# Patient Record
Sex: Male | Born: 1960 | Race: White | Hispanic: No | Marital: Single | Smoking: Former smoker
Health system: Southern US, Community
[De-identification: ages and names within clinical notes are randomized; demographics above are authoritative.]

## PROBLEM LIST (undated history)

## (undated) DIAGNOSIS — M5126 Other intervertebral disc displacement, lumbar region: Secondary | ICD-10-CM

## (undated) DIAGNOSIS — T8859XA Other complications of anesthesia, initial encounter: Secondary | ICD-10-CM

## (undated) DIAGNOSIS — T4145XA Adverse effect of unspecified anesthetic, initial encounter: Secondary | ICD-10-CM

## (undated) DIAGNOSIS — Z860101 Personal history of adenomatous and serrated colon polyps: Secondary | ICD-10-CM

## (undated) DIAGNOSIS — I714 Abdominal aortic aneurysm, without rupture, unspecified: Secondary | ICD-10-CM

## (undated) DIAGNOSIS — C329 Malignant neoplasm of larynx, unspecified: Secondary | ICD-10-CM

## (undated) DIAGNOSIS — K219 Gastro-esophageal reflux disease without esophagitis: Secondary | ICD-10-CM

## (undated) DIAGNOSIS — F32A Depression, unspecified: Secondary | ICD-10-CM

## (undated) DIAGNOSIS — E785 Hyperlipidemia, unspecified: Secondary | ICD-10-CM

## (undated) DIAGNOSIS — M419 Scoliosis, unspecified: Secondary | ICD-10-CM

## (undated) DIAGNOSIS — G4733 Obstructive sleep apnea (adult) (pediatric): Secondary | ICD-10-CM

## (undated) DIAGNOSIS — Z923 Personal history of irradiation: Secondary | ICD-10-CM

## (undated) DIAGNOSIS — F329 Major depressive disorder, single episode, unspecified: Secondary | ICD-10-CM

## (undated) DIAGNOSIS — Z8521 Personal history of malignant neoplasm of larynx: Secondary | ICD-10-CM

## (undated) DIAGNOSIS — M51369 Other intervertebral disc degeneration, lumbar region without mention of lumbar back pain or lower extremity pain: Secondary | ICD-10-CM

## (undated) DIAGNOSIS — Z9289 Personal history of other medical treatment: Secondary | ICD-10-CM

## (undated) DIAGNOSIS — F319 Bipolar disorder, unspecified: Secondary | ICD-10-CM

## (undated) DIAGNOSIS — M1990 Unspecified osteoarthritis, unspecified site: Secondary | ICD-10-CM

## (undated) DIAGNOSIS — F209 Schizophrenia, unspecified: Secondary | ICD-10-CM

## (undated) DIAGNOSIS — E039 Hypothyroidism, unspecified: Secondary | ICD-10-CM

## (undated) DIAGNOSIS — M5136 Other intervertebral disc degeneration, lumbar region: Secondary | ICD-10-CM

## (undated) DIAGNOSIS — Z86010 Personal history of colonic polyps: Secondary | ICD-10-CM

## (undated) DIAGNOSIS — I712 Thoracic aortic aneurysm, without rupture: Secondary | ICD-10-CM

## (undated) DIAGNOSIS — Z9989 Dependence on other enabling machines and devices: Secondary | ICD-10-CM

## (undated) HISTORY — DX: Thoracic aortic aneurysm, without rupture: I71.2

## (undated) HISTORY — DX: Depression, unspecified: F32.A

## (undated) HISTORY — DX: Obstructive sleep apnea (adult) (pediatric): G47.33

## (undated) HISTORY — DX: Malignant neoplasm of larynx, unspecified: C32.9

## (undated) HISTORY — PX: UMBILICAL HERNIA REPAIR: SHX196

## (undated) HISTORY — DX: Schizophrenia, unspecified: F20.9

## (undated) HISTORY — DX: Bipolar disorder, unspecified: F31.9

## (undated) HISTORY — DX: Hyperlipidemia, unspecified: E78.5

## (undated) HISTORY — DX: Abdominal aortic aneurysm, without rupture, unspecified: I71.40

## (undated) HISTORY — DX: Unspecified osteoarthritis, unspecified site: M19.90

## (undated) HISTORY — PX: COLONOSCOPY: SHX174

## (undated) HISTORY — PX: NECK SURGERY: SHX720

## (undated) HISTORY — DX: Major depressive disorder, single episode, unspecified: F32.9

## (undated) HISTORY — DX: Dependence on other enabling machines and devices: Z99.89

## (undated) HISTORY — DX: Abdominal aortic aneurysm, without rupture: I71.4

---

## 2010-03-29 ENCOUNTER — Ambulatory Visit: Payer: Self-pay | Admitting: Oncology

## 2010-04-08 LAB — CBC WITH DIFFERENTIAL/PLATELET
Eosinophils Absolute: 0.1 10*3/uL (ref 0.0–0.5)
MONO#: 0.5 10*3/uL (ref 0.1–0.9)
NEUT#: 3.5 10*3/uL (ref 1.5–6.5)
Platelets: 240 10*3/uL (ref 140–400)
RBC: 5.15 10*6/uL (ref 4.20–5.82)
RDW: 13.6 % (ref 11.0–14.6)
WBC: 5.4 10*3/uL (ref 4.0–10.3)

## 2010-04-08 LAB — COMPREHENSIVE METABOLIC PANEL
Albumin: 4.7 g/dL (ref 3.5–5.2)
CO2: 27 mEq/L (ref 19–32)
Chloride: 105 mEq/L (ref 96–112)
Glucose, Bld: 87 mg/dL (ref 70–99)
Potassium: 4.5 mEq/L (ref 3.5–5.3)
Sodium: 143 mEq/L (ref 135–145)
Total Protein: 7.1 g/dL (ref 6.0–8.3)

## 2010-04-08 LAB — TSH: TSH: 28.728 u[IU]/mL — ABNORMAL HIGH (ref 0.350–4.500)

## 2010-04-10 LAB — T3: T3, Total: 70.8 ng/dL — ABNORMAL LOW (ref 80.0–204.0)

## 2010-04-15 ENCOUNTER — Encounter: Admission: RE | Admit: 2010-04-15 | Discharge: 2010-04-15 | Payer: Self-pay | Admitting: Otolaryngology

## 2010-05-17 DIAGNOSIS — F309 Manic episode, unspecified: Secondary | ICD-10-CM | POA: Insufficient documentation

## 2010-05-17 DIAGNOSIS — M129 Arthropathy, unspecified: Secondary | ICD-10-CM | POA: Insufficient documentation

## 2010-05-17 DIAGNOSIS — H409 Unspecified glaucoma: Secondary | ICD-10-CM | POA: Insufficient documentation

## 2010-05-21 DIAGNOSIS — R110 Nausea: Secondary | ICD-10-CM | POA: Insufficient documentation

## 2010-10-07 ENCOUNTER — Other Ambulatory Visit: Payer: Self-pay | Admitting: Oncology

## 2010-10-07 ENCOUNTER — Encounter (HOSPITAL_BASED_OUTPATIENT_CLINIC_OR_DEPARTMENT_OTHER): Payer: Medicare Other | Admitting: Oncology

## 2010-10-07 DIAGNOSIS — C329 Malignant neoplasm of larynx, unspecified: Secondary | ICD-10-CM

## 2010-10-07 DIAGNOSIS — J984 Other disorders of lung: Secondary | ICD-10-CM

## 2010-10-07 DIAGNOSIS — E039 Hypothyroidism, unspecified: Secondary | ICD-10-CM

## 2010-10-07 LAB — COMPREHENSIVE METABOLIC PANEL
AST: 20 U/L (ref 0–37)
Albumin: 4.6 g/dL (ref 3.5–5.2)
Alkaline Phosphatase: 40 U/L (ref 39–117)
BUN: 18 mg/dL (ref 6–23)
Potassium: 4.3 mEq/L (ref 3.5–5.3)
Sodium: 142 mEq/L (ref 135–145)
Total Bilirubin: 0.7 mg/dL (ref 0.3–1.2)

## 2010-10-07 LAB — CBC WITH DIFFERENTIAL/PLATELET
Basophils Absolute: 0 10*3/uL (ref 0.0–0.1)
EOS%: 1.1 % (ref 0.0–7.0)
LYMPH%: 26.5 % (ref 14.0–49.0)
MCH: 30 pg (ref 27.2–33.4)
MCV: 89.3 fL (ref 79.3–98.0)
MONO%: 8.2 % (ref 0.0–14.0)
RBC: 5 10*6/uL (ref 4.20–5.82)
RDW: 13.9 % (ref 11.0–14.6)

## 2010-12-19 ENCOUNTER — Encounter: Payer: Self-pay | Admitting: Internal Medicine

## 2010-12-19 ENCOUNTER — Ambulatory Visit (INDEPENDENT_AMBULATORY_CARE_PROVIDER_SITE_OTHER): Payer: Medicare HMO | Admitting: Internal Medicine

## 2010-12-19 DIAGNOSIS — C329 Malignant neoplasm of larynx, unspecified: Secondary | ICD-10-CM

## 2010-12-19 DIAGNOSIS — F319 Bipolar disorder, unspecified: Secondary | ICD-10-CM

## 2010-12-19 DIAGNOSIS — Z79899 Other long term (current) drug therapy: Secondary | ICD-10-CM

## 2010-12-19 DIAGNOSIS — K5190 Ulcerative colitis, unspecified, without complications: Secondary | ICD-10-CM | POA: Insufficient documentation

## 2010-12-19 DIAGNOSIS — E039 Hypothyroidism, unspecified: Secondary | ICD-10-CM

## 2010-12-19 DIAGNOSIS — Z23 Encounter for immunization: Secondary | ICD-10-CM

## 2010-12-19 DIAGNOSIS — E785 Hyperlipidemia, unspecified: Secondary | ICD-10-CM | POA: Insufficient documentation

## 2010-12-19 DIAGNOSIS — F209 Schizophrenia, unspecified: Secondary | ICD-10-CM

## 2010-12-19 DIAGNOSIS — Z0000 Encounter for general adult medical examination without abnormal findings: Secondary | ICD-10-CM

## 2010-12-19 HISTORY — DX: Bipolar disorder, unspecified: F31.9

## 2010-12-19 HISTORY — DX: Malignant neoplasm of larynx, unspecified: C32.9

## 2010-12-19 LAB — HEPATIC FUNCTION PANEL
ALT: 25 U/L (ref 0–53)
Albumin: 4.7 g/dL (ref 3.5–5.2)
Total Bilirubin: 0.7 mg/dL (ref 0.3–1.2)
Total Protein: 7.3 g/dL (ref 6.0–8.3)

## 2010-12-19 LAB — T4, FREE: Free T4: 1.48 ng/dL (ref 0.60–1.60)

## 2010-12-19 LAB — LIPID PANEL
Cholesterol: 176 mg/dL (ref 0–200)
Triglycerides: 156 mg/dL — ABNORMAL HIGH (ref 0.0–149.0)

## 2010-12-19 LAB — BASIC METABOLIC PANEL
BUN: 20 mg/dL (ref 6–23)
Chloride: 106 mEq/L (ref 96–112)
Creatinine, Ser: 1.1 mg/dL (ref 0.4–1.5)
Glucose, Bld: 112 mg/dL — ABNORMAL HIGH (ref 70–99)

## 2010-12-19 NOTE — Assessment & Plan Note (Signed)
Asx. Obtain tsh and free t4 for surveillance.

## 2010-12-19 NOTE — Assessment & Plan Note (Signed)
No behavioral issues noted today. Continue followup with psychiatry

## 2010-12-19 NOTE — Assessment & Plan Note (Signed)
Tolerating statin tx. Obtain flp/lft. Not entirely fasting but has transportation issues.

## 2010-12-19 NOTE — Progress Notes (Signed)
  Subjective:    Patient ID: Andrew Jennings, male    DOB: 1960/11/01, 50 y.o.   MRN: [id number]  HPI Pt presents to clinic to establish care and for followup of multiple medical problems.  H/o hyperlipidemia tolerating statin tx and trilipex. No known recent lft or lipid panel. States was diagnosed with ?UC by GI with last colonoscopy 2011. Initially did not tolerate pentasa but not tolerates at lower dose without adverse effect. Followed by ENT and H/O for laryngeal cancer with no known h/o recurrence. Notes h/o bipolar and schizophrenia followed by psychiatry without behavioral issues today in clinic. H/o hypothyroidism with no obvious s/s of hyper or hypothyroidism currently. States underwent DRE and PSA (reportedly nl) approximately 3 months ago but needs tetanus booster. No other complaints.  Reviewed pmh, psh, medications, allergies, social hx and family hx.    Review of Systems  Constitutional: Negative for fever, appetite change and unexpected weight change.  Eyes: Negative for pain and redness.  Respiratory: Negative for cough, shortness of breath and wheezing.   Cardiovascular: Negative for chest pain and leg swelling.  Gastrointestinal: Negative for abdominal pain, blood in stool and abdominal distention.  Musculoskeletal: Negative for back pain and arthralgias.  Skin: Negative for color change and rash.  Neurological: Negative for seizures, syncope and weakness.  Hematological: Negative for adenopathy. Does not bruise/bleed easily.  Psychiatric/Behavioral: Negative for behavioral problems, confusion and agitation.  All other systems reviewed and are negative.       Objective:   Physical Exam  Nursing note and vitals reviewed. Constitutional: He appears well-developed and well-nourished. No distress.  HENT:  Head: Normocephalic and atraumatic.  Right Ear: Tympanic membrane, external ear and ear canal normal.  Left Ear: Tympanic membrane, external ear and ear canal normal.    Nose: Nose normal.  Mouth/Throat: Uvula is midline, oropharynx is clear and moist and mucous membranes are normal. No oropharyngeal exudate.  Eyes: Conjunctivae and EOM are normal. Pupils are equal, round, and reactive to light. Right eye exhibits no discharge. Left eye exhibits no discharge. No scleral icterus.  Neck: Neck supple. No JVD present. Carotid bruit is not present.  Cardiovascular: Normal rate, regular rhythm and normal heart sounds.  Exam reveals no gallop and no friction rub.   No murmur heard. Pulmonary/Chest: Effort normal. No respiratory distress. He has no wheezes. He has no rales.  Abdominal: Soft. Bowel sounds are normal. He exhibits no distension and no mass. There is no tenderness. There is no rebound and no guarding.  Lymphadenopathy:    He has no cervical adenopathy.  Neurological: He is alert.  Skin: Skin is warm and dry. No rash noted. He is not diaphoretic. No erythema.  Psychiatric: He has a normal mood and affect.          Assessment & Plan:

## 2010-12-24 ENCOUNTER — Telehealth: Payer: Self-pay | Admitting: *Deleted

## 2010-12-24 ENCOUNTER — Telehealth: Payer: Self-pay

## 2010-12-24 MED ORDER — LEVOTHYROXINE SODIUM 25 MCG PO TABS: 25.0000 ug | ORAL_TABLET | Freq: Every day | ORAL | Status: DC

## 2010-12-24 NOTE — Telephone Encounter (Signed)
Result note has already been sent to nursing who will contact

## 2010-12-24 NOTE — Telephone Encounter (Signed)
Pt. Would like results of lab work.

## 2010-12-24 NOTE — Telephone Encounter (Signed)
Left message for pt to call back  °

## 2010-12-24 NOTE — Telephone Encounter (Signed)
Message copied by Beverely Low on Tue Dec 24, 2010  4:10 PM ------      Message from: Staci Righter      Created: Mon Dec 23, 2010  7:30 PM       Thyroid tests trending towards overactive. Decrease synthroid dose to 25 mcg po qd. Will recheck next visit. Other labs nl

## 2010-12-24 NOTE — Telephone Encounter (Signed)
Pt notified and verbalized understanding. Rx for synthroid sent to pharm

## 2010-12-24 NOTE — Telephone Encounter (Signed)
Message copied by Beverely Low on Tue Dec 24, 2010  4:32 PM ------      Message from: Staci Righter      Created: Mon Dec 23, 2010  7:30 PM       Thyroid tests trending towards overactive. Decrease synthroid dose to 25 mcg po qd. Will recheck next visit. Other labs nl

## 2011-01-03 ENCOUNTER — Other Ambulatory Visit: Payer: Self-pay

## 2011-01-06 MED ORDER — ROSUVASTATIN CALCIUM 5 MG PO TABS: 5.0000 mg | ORAL_TABLET | Freq: Every day | ORAL | Status: DC

## 2011-01-06 NOTE — Telephone Encounter (Signed)
done

## 2011-01-17 ENCOUNTER — Other Ambulatory Visit: Payer: Self-pay

## 2011-01-17 MED ORDER — MESALAMINE ER 250 MG PO CPCR: 250.0000 mg | ORAL_CAPSULE | Freq: Two times a day (BID) | ORAL | Status: DC

## 2011-01-17 NOTE — Telephone Encounter (Signed)
Opened in error

## 2011-01-20 ENCOUNTER — Other Ambulatory Visit: Payer: Self-pay

## 2011-01-20 MED ORDER — HALOPERIDOL 0.5 MG PO TABS: ORAL_TABLET | ORAL | Status: DC

## 2011-03-20 ENCOUNTER — Encounter: Payer: Self-pay | Admitting: Internal Medicine

## 2011-03-20 ENCOUNTER — Ambulatory Visit (INDEPENDENT_AMBULATORY_CARE_PROVIDER_SITE_OTHER): Payer: Medicare HMO | Admitting: Internal Medicine

## 2011-03-20 ENCOUNTER — Other Ambulatory Visit: Payer: Self-pay | Admitting: Internal Medicine

## 2011-03-20 DIAGNOSIS — K5190 Ulcerative colitis, unspecified, without complications: Secondary | ICD-10-CM

## 2011-03-20 DIAGNOSIS — E039 Hypothyroidism, unspecified: Secondary | ICD-10-CM

## 2011-03-20 DIAGNOSIS — E785 Hyperlipidemia, unspecified: Secondary | ICD-10-CM

## 2011-03-20 LAB — TSH: TSH: 63 u[IU]/mL — ABNORMAL HIGH (ref 0.35–5.50)

## 2011-03-20 MED ORDER — SILDENAFIL CITRATE 100 MG PO TABS: 100.0000 mg | ORAL_TABLET | ORAL | Status: DC | PRN

## 2011-03-20 MED ORDER — LEVOTHYROXINE SODIUM 25 MCG PO TABS: 50.0000 ug | ORAL_TABLET | Freq: Every day | ORAL | Status: DC

## 2011-03-20 NOTE — Progress Notes (Signed)
  Subjective:    Patient ID: Andrew Jennings, male    DOB: [birth year], 50 y.o.   MRN: [id number]  HPI  50 year old  patient who is seen today for followup.  Medical problems include dyslipidemia. Medical records reviewed he has been on Crestor as well as fenofibrate. He has treated hypothyroidism 3 months ago his Synthroid dose was down titrated to 25 mcg due to a mildly depressed TSH. He states that he now feels a little sluggish. He has a history of ulcerative colitis and has been on mesalamine. He has dyslipidemia and remained stable on Crestor which he continues to tolerate. He is requesting a prescription for Viagra.  He is followed by [facility] and states that he is on 35 mg of Haldol daily.     Review of Systems  Constitutional: Positive for fatigue. Negative for fever, chills and appetite change.  HENT: Negative for hearing loss, ear pain, congestion, sore throat, trouble swallowing, neck stiffness, dental problem, voice change and tinnitus.   Eyes: Negative for pain, discharge and visual disturbance.  Respiratory: Negative for cough, chest tightness, wheezing and stridor.   Cardiovascular: Negative for chest pain, palpitations and leg swelling.  Gastrointestinal: Negative for nausea, vomiting, abdominal pain, diarrhea, constipation, blood in stool and abdominal distention.  Genitourinary: Negative for urgency, hematuria, flank pain, discharge, difficulty urinating and genital sores.  Musculoskeletal: Negative for myalgias, back pain, joint swelling, arthralgias and gait problem.  Skin: Negative for rash.  Neurological: Negative for dizziness, syncope, speech difficulty, weakness, numbness and headaches.  Hematological: Negative for adenopathy. Does not bruise/bleed easily.  Psychiatric/Behavioral: Negative for behavioral problems and dysphoric mood. The patient is not nervous/anxious.        Objective:   Physical Exam  Constitutional: He is oriented to person, place,  and time. He appears well-developed.  HENT:  Head: Normocephalic.  Right Ear: External ear normal.  Left Ear: External ear normal.  Eyes: Conjunctivae and EOM are normal.  Neck: Normal range of motion.  Cardiovascular: Normal rate and normal heart sounds.   Pulmonary/Chest: Breath sounds normal.  Abdominal: Bowel sounds are normal.  Musculoskeletal: Normal range of motion. He exhibits no edema and no tenderness.  Neurological: He is alert and oriented to person, place, and time.  Psychiatric: He has a normal mood and affect. His behavior is normal.          Assessment & Plan:    Dyslipidemia. Will continue present regimen Ulcerative colitis stable Hypothyroidism. Will check a TSH due to his recent downward dose titration and history of fatigue. History of bipolar disorder and schizophrenia. Followup mental health

## 2011-03-20 NOTE — Patient Instructions (Signed)
Limit your sodium (Salt) intake    It is important that you exercise regularly, at least 20 minutes 3 to 4 times per week.  If you develop chest pain or shortness of breath seek  medical attention.  Return in 3 months for follow-up  

## 2011-03-20 NOTE — Progress Notes (Signed)
Quick Note:  Spoke with pt- informed of lab and increase in med , needs to call and scdule lab in 1 month. New order for med sent to [facility] ______

## 2011-03-31 ENCOUNTER — Ambulatory Visit: Payer: Medicare HMO | Admitting: Internal Medicine

## 2011-04-07 ENCOUNTER — Other Ambulatory Visit: Payer: Self-pay | Admitting: Oncology

## 2011-04-07 ENCOUNTER — Encounter (HOSPITAL_BASED_OUTPATIENT_CLINIC_OR_DEPARTMENT_OTHER): Payer: Medicare HMO | Admitting: Oncology

## 2011-04-07 DIAGNOSIS — J984 Other disorders of lung: Secondary | ICD-10-CM

## 2011-04-07 DIAGNOSIS — Z87891 Personal history of nicotine dependence: Secondary | ICD-10-CM

## 2011-04-07 DIAGNOSIS — E039 Hypothyroidism, unspecified: Secondary | ICD-10-CM

## 2011-04-07 DIAGNOSIS — C329 Malignant neoplasm of larynx, unspecified: Secondary | ICD-10-CM

## 2011-04-07 DIAGNOSIS — E890 Postprocedural hypothyroidism: Secondary | ICD-10-CM

## 2011-04-07 LAB — COMPREHENSIVE METABOLIC PANEL
ALT: 17 U/L (ref 0–53)
AST: 24 U/L (ref 0–37)
Albumin: 4.7 g/dL (ref 3.5–5.2)
BUN: 16 mg/dL (ref 6–23)
CO2: 27 mEq/L (ref 19–32)
Calcium: 9.9 mg/dL (ref 8.4–10.5)
Chloride: 105 mEq/L (ref 96–112)
Creatinine, Ser: 1.52 mg/dL — ABNORMAL HIGH (ref 0.50–1.35)
Potassium: 4.7 mEq/L (ref 3.5–5.3)

## 2011-04-07 LAB — CBC WITH DIFFERENTIAL/PLATELET
Basophils Absolute: 0 10*3/uL (ref 0.0–0.1)
HCT: 43.3 % (ref 38.4–49.9)
HGB: 14.7 g/dL (ref 13.0–17.1)
MONO#: 0.5 10*3/uL (ref 0.1–0.9)
NEUT#: 3.6 10*3/uL (ref 1.5–6.5)
NEUT%: 66 % (ref 39.0–75.0)
RDW: 14 % (ref 11.0–14.6)
WBC: 5.4 10*3/uL (ref 4.0–10.3)
lymph#: 1.2 10*3/uL (ref 0.9–3.3)

## 2011-04-09 ENCOUNTER — Telehealth: Payer: Self-pay | Admitting: *Deleted

## 2011-04-09 NOTE — Telephone Encounter (Signed)
Selena Batten, I sent you these voice mail messages to see if that would help you figure out what pt needs.  He is asking for a new CPap Machine, I think.  He got his last one in [place], but knows no pressures or anything about it to make an appropriate order.

## 2011-04-09 NOTE — Telephone Encounter (Signed)
VM from pt requesting cpap rx for machine he currently has??? Called number left [phone] - which was to [facility] - when I call and spoke with mrs. Ward- they have already call pt and informed that they can not provide equip for him r/t ins coverage.  I will await call back from pt - but need to explore CPAP - i have no info on it

## 2011-04-16 ENCOUNTER — Other Ambulatory Visit (INDEPENDENT_AMBULATORY_CARE_PROVIDER_SITE_OTHER): Payer: Medicare HMO

## 2011-04-16 DIAGNOSIS — E039 Hypothyroidism, unspecified: Secondary | ICD-10-CM

## 2011-04-17 LAB — TSH: TSH: 42.11 u[IU]/mL — ABNORMAL HIGH (ref 0.35–5.50)

## 2011-04-18 NOTE — Progress Notes (Signed)
Quick Note:  Attempt to call - no ans no mach - will try on monday ______

## 2011-04-21 ENCOUNTER — Other Ambulatory Visit: Payer: Self-pay | Admitting: Internal Medicine

## 2011-04-21 MED ORDER — LEVOTHYROXINE SODIUM 75 MCG PO TABS: 75.0000 ug | ORAL_TABLET | Freq: Every day | ORAL | Status: DC

## 2011-04-21 NOTE — Progress Notes (Signed)
Quick Note:  Spoke with pt - informed of lab and new med . rx sent to [facility] , he will call back in 8 days to make appt - going out of town. KIK ______

## 2011-04-27 ENCOUNTER — Encounter: Payer: Self-pay | Admitting: Oncology

## 2011-05-02 ENCOUNTER — Telehealth: Payer: Self-pay | Admitting: *Deleted

## 2011-05-02 NOTE — Telephone Encounter (Signed)
Pt is asking who called him 2 weeks ago"???"

## 2011-05-09 ENCOUNTER — Encounter: Payer: Self-pay | Admitting: Internal Medicine

## 2011-05-09 ENCOUNTER — Ambulatory Visit (INDEPENDENT_AMBULATORY_CARE_PROVIDER_SITE_OTHER): Payer: Medicare HMO | Admitting: Internal Medicine

## 2011-05-09 VITALS — BP 108/70 | Temp 98.1°F | Wt 179.0 lb

## 2011-05-09 DIAGNOSIS — Z5181 Encounter for therapeutic drug level monitoring: Secondary | ICD-10-CM

## 2011-05-09 DIAGNOSIS — E039 Hypothyroidism, unspecified: Secondary | ICD-10-CM

## 2011-05-09 DIAGNOSIS — E785 Hyperlipidemia, unspecified: Secondary | ICD-10-CM

## 2011-05-09 LAB — TSH: TSH: 29.78 u[IU]/mL — ABNORMAL HIGH (ref 0.35–5.50)

## 2011-05-09 NOTE — Patient Instructions (Signed)
It is important that you exercise regularly, at least 20 minutes 3 to 4 times per week.  If you develop chest pain or shortness of breath seek  medical attention.   

## 2011-05-09 NOTE — Progress Notes (Signed)
  Subjective:    Patient ID: Andrew Jennings, male    DOB: Apr 04, 1961, 50 y.o.   MRN: [id number]  HPI  50 year old patient who is seen today for followup of his dyslipidemia and hypothyroidism. His Synthroid dose was recently up titrated to 75 mcg. Remains on Crestor 5 mg daily;  he is also on fibrate  therapy. He states that years ago he had a significant CPK elevation. He is requesting CPK. He does complain of some mild nonspecific anterior chest wall pain describes as a achiness this comes on with stress and seems to be paroxysmal. He denies any exertional chest pain.    Review of Systems  Constitutional: Negative for fever, chills, appetite change and fatigue.  HENT: Negative for hearing loss, ear pain, congestion, sore throat, trouble swallowing, neck stiffness, dental problem, voice change and tinnitus.   Eyes: Negative for pain, discharge and visual disturbance.  Respiratory: Negative for cough, chest tightness, wheezing and stridor.   Cardiovascular: Positive for chest pain. Negative for palpitations and leg swelling.  Gastrointestinal: Negative for nausea, vomiting, abdominal pain, diarrhea, constipation, blood in stool and abdominal distention.  Genitourinary: Negative for urgency, hematuria, flank pain, discharge, difficulty urinating and genital sores.  Musculoskeletal: Negative for myalgias, back pain, joint swelling, arthralgias and gait problem.  Skin: Negative for rash.  Neurological: Negative for dizziness, syncope, speech difficulty, weakness, numbness and headaches.  Hematological: Negative for adenopathy. Does not bruise/bleed easily.  Psychiatric/Behavioral: Negative for behavioral problems and dysphoric mood. The patient is not nervous/anxious.        Objective:   Physical Exam  Constitutional: He is oriented to person, place, and time. He appears well-developed.  HENT:  Head: Normocephalic.  Right Ear: External ear normal.  Left Ear: External ear normal.  Eyes:  Conjunctivae and EOM are normal.  Neck: Normal range of motion.  Cardiovascular: Normal rate and normal heart sounds.   Pulmonary/Chest: Effort normal and breath sounds normal. He exhibits no tenderness.       No chest wall tenderness  Abdominal: Bowel sounds are normal.  Musculoskeletal: Normal range of motion. He exhibits no edema and no tenderness.  Neurological: He is alert and oriented to person, place, and time.  Psychiatric: He has a normal mood and affect. His behavior is normal.          Assessment & Plan:   Dyslipidemia. Hypothyroidism. We'll check a followup TSH and uptitrate as necessary History of CPK elevation. Patient is on both statin therapy and fibrate. Will check a total CPK

## 2011-05-12 ENCOUNTER — Telehealth: Payer: Self-pay | Admitting: Internal Medicine

## 2011-05-12 ENCOUNTER — Ambulatory Visit (HOSPITAL_COMMUNITY)
Admission: RE | Admit: 2011-05-12 | Discharge: 2011-05-12 | Disposition: A | Discharge status: Home / Self Care | Payer: Medicare HMO | Source: Ambulatory Visit | Attending: Oncology | Admitting: Oncology

## 2011-05-12 ENCOUNTER — Encounter (HOSPITAL_COMMUNITY): Payer: Self-pay

## 2011-05-12 DIAGNOSIS — C760 Malignant neoplasm of head, face and neck: Secondary | ICD-10-CM | POA: Insufficient documentation

## 2011-05-12 DIAGNOSIS — Z87891 Personal history of nicotine dependence: Secondary | ICD-10-CM

## 2011-05-12 DIAGNOSIS — J438 Other emphysema: Secondary | ICD-10-CM | POA: Insufficient documentation

## 2011-05-12 MED ORDER — IOHEXOL 300 MG/ML  SOLN
80.0000 mL | Freq: Once | INTRAMUSCULAR | Status: AC | PRN
  Administered 2011-05-12: 80 mL via INTRAVENOUS

## 2011-05-12 MED ORDER — LEVOTHYROXINE SODIUM 25 MCG PO TABS: 25.0000 ug | ORAL_TABLET | Freq: Every day | ORAL | Status: DC

## 2011-05-12 NOTE — Telephone Encounter (Signed)
Spoke with patient and rx sent 

## 2011-05-12 NOTE — Telephone Encounter (Signed)
Pt requesting results of lab work. Please contact. °

## 2011-05-12 NOTE — Telephone Encounter (Signed)
Pt would like a call back bout his lab work. He knows that Dr Kirtland Bouchard is out of office. Thanks.

## 2011-05-13 ENCOUNTER — Telehealth: Payer: Self-pay | Admitting: Internal Medicine

## 2011-05-13 MED ORDER — LEVOTHYROXINE SODIUM 125 MCG PO TABS: 125.0000 ug | ORAL_TABLET | Freq: Every day | ORAL | Status: DC

## 2011-05-13 NOTE — Telephone Encounter (Signed)
Pt called and said [facility] would not fill the script for levothyroxine (LEVOTHROID) 25 MCG tablet. Pt said that he had thrown out the orig 25 mcg he had rcvd because he didn't know he should be taking them. Pls call pt to discuss what dose of thyroid med he is suppose to take and then call in the correct dose to [facility].

## 2011-05-19 ENCOUNTER — Other Ambulatory Visit: Payer: Self-pay | Admitting: Oncology

## 2011-05-19 ENCOUNTER — Encounter: Payer: Self-pay | Admitting: Oncology

## 2011-05-19 ENCOUNTER — Telehealth: Payer: Self-pay | Admitting: Oncology

## 2011-05-19 NOTE — Telephone Encounter (Signed)
Message copied by Kallie Locks on Mon May 19, 2011  2:14 PM ------      Message from: HA, Raliegh Ip T      Created: Mon May 19, 2011  1:43 PM       Please call pt.   His CT chest last week did no show any concerning finding.  I already mailed him a formal result.  Thanks.

## 2011-05-19 NOTE — Telephone Encounter (Signed)
1414: called patient [phone]) with CT results; patient verbalized understanding.

## 2011-06-20 ENCOUNTER — Ambulatory Visit: Payer: Medicare HMO | Admitting: Internal Medicine

## 2011-06-25 ENCOUNTER — Telehealth: Payer: Self-pay | Admitting: Oncology

## 2011-06-25 NOTE — Telephone Encounter (Signed)
S/w the pt's friend regarding the march 2013 appts

## 2011-06-30 ENCOUNTER — Ambulatory Visit: Payer: Medicare HMO | Admitting: Internal Medicine

## 2011-06-30 DIAGNOSIS — Z0289 Encounter for other administrative examinations: Secondary | ICD-10-CM

## 2011-07-09 ENCOUNTER — Ambulatory Visit (INDEPENDENT_AMBULATORY_CARE_PROVIDER_SITE_OTHER): Payer: Medicare HMO | Admitting: Internal Medicine

## 2011-07-09 ENCOUNTER — Encounter: Payer: Self-pay | Admitting: Internal Medicine

## 2011-07-09 DIAGNOSIS — J45909 Unspecified asthma, uncomplicated: Secondary | ICD-10-CM

## 2011-07-09 DIAGNOSIS — C329 Malignant neoplasm of larynx, unspecified: Secondary | ICD-10-CM

## 2011-07-09 DIAGNOSIS — E039 Hypothyroidism, unspecified: Secondary | ICD-10-CM

## 2011-07-09 LAB — TSH: TSH: 7.18 u[IU]/mL — ABNORMAL HIGH (ref 0.35–5.50)

## 2011-07-09 NOTE — Progress Notes (Signed)
  Subjective:    Patient ID: Andrew Jennings, male    DOB: 02-27-1961, 50 y.o.   MRN: [id number]  HPI 50 year old patient who is seen today for followup of  Hypothyroidism;  he has dyslipidemia and bipolar disorder. His Synthroid dose was adjusted 8 weeks ago due to an elevated TSH. He states that he misses approximately 3 doses as per month. He feels well today without concerns or complaints    Review of Systems  Constitutional: Negative for fever, chills, appetite change and fatigue.  HENT: Negative for hearing loss, ear pain, congestion, sore throat, trouble swallowing, neck stiffness, dental problem, voice change and tinnitus.   Eyes: Negative for pain, discharge and visual disturbance.  Respiratory: Negative for cough, chest tightness, wheezing and stridor.   Cardiovascular: Negative for chest pain, palpitations and leg swelling.  Gastrointestinal: Negative for nausea, vomiting, abdominal pain, diarrhea, constipation, blood in stool and abdominal distention.  Genitourinary: Negative for urgency, hematuria, flank pain, discharge, difficulty urinating and genital sores.  Musculoskeletal: Negative for myalgias, back pain, joint swelling, arthralgias and gait problem.  Skin: Negative for rash.  Neurological: Negative for dizziness, syncope, speech difficulty, weakness, numbness and headaches.  Hematological: Negative for adenopathy. Does not bruise/bleed easily.  Psychiatric/Behavioral: Negative for behavioral problems and dysphoric mood. The patient is not nervous/anxious.        Objective:   Physical Exam  Constitutional: He appears well-developed and well-nourished.  HENT:  Head: Normocephalic and atraumatic.  Right Ear: External ear normal.  Left Ear: External ear normal.  Nose: Nose normal.  Mouth/Throat: Oropharynx is clear and moist.  Eyes: Conjunctivae and EOM are normal. Pupils are equal, round, and reactive to light. No scleral icterus.  Neck: Normal range of motion. Neck  supple. No JVD present. No thyromegaly present.  Cardiovascular: Regular rhythm, normal heart sounds and intact distal pulses.  Exam reveals no gallop and no friction rub.   No murmur heard. Pulmonary/Chest: Effort normal and breath sounds normal. He exhibits no tenderness.  Abdominal: Soft. Bowel sounds are normal. He exhibits no distension and no mass. There is no tenderness.  Genitourinary: Prostate normal and penis normal.  Musculoskeletal: Normal range of motion. He exhibits no edema and no tenderness.  Lymphadenopathy:    He has no cervical adenopathy.  Neurological: He is alert. He has normal reflexes. No cranial nerve deficit. Coordination normal.  Skin: Skin is warm and dry. No rash noted.  Psychiatric: He has a normal mood and affect. His behavior is normal.          Assessment & Plan:   Hypothyroidism. We'll check a TSH today and compliance issues addressed. Dyslipidemia. Will continue present there  Recheck 4 months

## 2011-07-09 NOTE — Patient Instructions (Signed)
  Take thyroid medication daily  Return in 6 months for followup

## 2011-07-16 ENCOUNTER — Other Ambulatory Visit: Payer: Self-pay | Admitting: Internal Medicine

## 2011-07-16 ENCOUNTER — Telehealth: Payer: Self-pay

## 2011-07-16 NOTE — Telephone Encounter (Signed)
Pt requesting lab results. Pls call.

## 2011-07-17 ENCOUNTER — Telehealth: Payer: Self-pay | Admitting: Internal Medicine

## 2011-07-17 MED ORDER — MESALAMINE ER 250 MG PO CPCR: 250.0000 mg | ORAL_CAPSULE | Freq: Two times a day (BID) | ORAL | Status: DC

## 2011-07-17 NOTE — Telephone Encounter (Signed)
Please call/notify patient that lab/test/procedure is normal.  Stress compliance

## 2011-07-17 NOTE — Telephone Encounter (Signed)
Pt requesting refill on PENTASA 250 MG CR capsule   [facility] [place]

## 2011-07-17 NOTE — Telephone Encounter (Signed)
Spoke with pt - informed lab normal continue meds as rx'd. No changes

## 2011-07-17 NOTE — Telephone Encounter (Signed)
Please advise 

## 2011-07-17 NOTE — Telephone Encounter (Signed)
done

## 2011-08-04 ENCOUNTER — Other Ambulatory Visit: Payer: Self-pay | Admitting: Oncology

## 2011-08-04 ENCOUNTER — Telehealth: Payer: Self-pay | Admitting: Oncology

## 2011-08-04 NOTE — Telephone Encounter (Signed)
Called pt, left message for lab,CT and MD for March 2013 °

## 2011-08-08 ENCOUNTER — Ambulatory Visit: Payer: Medicare HMO | Admitting: Internal Medicine

## 2011-08-21 ENCOUNTER — Telehealth: Payer: Self-pay | Admitting: Oncology

## 2011-08-21 ENCOUNTER — Encounter: Payer: Self-pay | Admitting: Internal Medicine

## 2011-08-21 NOTE — Telephone Encounter (Signed)
pt called and cancelled appts for march and ct scan and states that he will call back after 04/13 to r/s appts

## 2011-08-22 ENCOUNTER — Encounter: Payer: Self-pay | Admitting: Internal Medicine

## 2011-09-23 ENCOUNTER — Other Ambulatory Visit: Payer: Self-pay

## 2011-09-23 ENCOUNTER — Telehealth: Payer: Self-pay

## 2011-09-23 ENCOUNTER — Encounter: Payer: Self-pay | Admitting: Internal Medicine

## 2011-09-23 ENCOUNTER — Ambulatory Visit (AMBULATORY_SURGERY_CENTER): Payer: Medicare HMO

## 2011-09-23 VITALS — Ht 67.0 in | Wt 179.9 lb

## 2011-09-23 DIAGNOSIS — Z1211 Encounter for screening for malignant neoplasm of colon: Secondary | ICD-10-CM

## 2011-09-23 MED ORDER — PEG-KCL-NACL-NASULF-NA ASC-C 100 G PO SOLR: 1.0000 | Freq: Once | ORAL | Status: AC

## 2011-09-23 MED ORDER — CHOLINE FENOFIBRATE 135 MG PO CPDR: 135.0000 mg | DELAYED_RELEASE_CAPSULE | Freq: Every day | ORAL | Status: DC

## 2011-09-23 NOTE — Telephone Encounter (Signed)
Called Andrew Jennings at home to discuss scheduling an appt with Dr Marina Goodell in the office first prior to his colonoscopy, due to patient's complicated medical history. Will get patient's records from Dr Juliane Poot in [place]. The medical release form was signed at the pre-visit. Andrew Jennings was rescheduled for an office visit with Dr Marina Goodell for 11/05/11 at 1:45pm. Ulis Rias RN

## 2011-09-23 NOTE — Progress Notes (Signed)
Pt came into office today for his pre-visit prior to the colonoscopy on 11/04/11 with Dr Marina Goodell. Pt does not have a drivers license or a driver for his appt on 16/10/96 and was upset about our requirement to have someone present prior to sedation.I explained to the patient this was our policy. He also had a colonoscopy done in [place] 2 years ago by a Dr Gerlene Fee and did sign a medical release which will be given to [facility] CMA. Ulis Rias RN

## 2011-09-26 ENCOUNTER — Telehealth: Payer: Self-pay | Admitting: *Deleted

## 2011-09-26 NOTE — Telephone Encounter (Signed)
I called [facility] at [phone] and asked if they got the release Ulis Rias RN faxed them on 09-24-2011.  The women said they faxed something to [place] and when I gave her our fax number of [phone].  The person I spoke to said she would fax the information to [place].

## 2011-09-26 NOTE — Telephone Encounter (Signed)
Called [facility] to advise we did get the records this AM we requested and thanked them. I filed them in the future appt records file for Alonna Buckler CMA. Pt has appt on 11-05-2011.

## 2011-10-02 ENCOUNTER — Other Ambulatory Visit (HOSPITAL_COMMUNITY): Payer: Medicare HMO

## 2011-10-02 ENCOUNTER — Other Ambulatory Visit: Payer: Medicare HMO

## 2011-10-06 ENCOUNTER — Ambulatory Visit: Payer: Medicare HMO | Admitting: Oncology

## 2011-10-06 ENCOUNTER — Other Ambulatory Visit: Payer: Medicare HMO | Admitting: Lab

## 2011-10-20 ENCOUNTER — Telehealth: Payer: Self-pay | Admitting: *Deleted

## 2011-10-20 ENCOUNTER — Other Ambulatory Visit: Payer: Self-pay | Admitting: *Deleted

## 2011-10-20 NOTE — Telephone Encounter (Signed)
VM from pt states he needs to r/s his canceled appts from March.  POF sent to scheduling to call pt to r/s his appointments.

## 2011-10-22 ENCOUNTER — Telehealth: Payer: Self-pay | Admitting: *Deleted

## 2011-10-22 ENCOUNTER — Encounter: Payer: Medicare HMO | Admitting: Internal Medicine

## 2011-10-22 NOTE — Telephone Encounter (Signed)
Pt left VM informing he is on his way to see Dr. Jenne Pane for "bleeding in my throat" and he wanted Dr. Gaylyn Rong to be aware.

## 2011-10-28 ENCOUNTER — Telehealth: Payer: Self-pay | Admitting: Oncology

## 2011-10-28 NOTE — Telephone Encounter (Signed)
called pt and scheduled ct scan for 05/07  @ [facility] with a f/u on 05/10

## 2011-11-04 ENCOUNTER — Encounter: Payer: Medicare HMO | Admitting: Internal Medicine

## 2011-11-05 ENCOUNTER — Ambulatory Visit (INDEPENDENT_AMBULATORY_CARE_PROVIDER_SITE_OTHER): Payer: Medicare HMO | Admitting: Internal Medicine

## 2011-11-05 ENCOUNTER — Encounter: Payer: Self-pay | Admitting: Internal Medicine

## 2011-11-05 VITALS — BP 110/64 | HR 80 | Ht 67.0 in | Wt 179.0 lb

## 2011-11-05 DIAGNOSIS — K648 Other hemorrhoids: Secondary | ICD-10-CM

## 2011-11-05 DIAGNOSIS — K589 Irritable bowel syndrome without diarrhea: Secondary | ICD-10-CM

## 2011-11-05 DIAGNOSIS — Z860100 Personal history of colon polyps, unspecified: Secondary | ICD-10-CM

## 2011-11-05 DIAGNOSIS — K529 Noninfective gastroenteritis and colitis, unspecified: Secondary | ICD-10-CM

## 2011-11-05 DIAGNOSIS — K5289 Other specified noninfective gastroenteritis and colitis: Secondary | ICD-10-CM

## 2011-11-05 MED ORDER — PEG-KCL-NACL-NASULF-NA ASC-C 100 G PO SOLR: 1.0000 | Freq: Once | ORAL | Status: DC

## 2011-11-05 NOTE — Patient Instructions (Signed)
You have been scheduled for a colonoscopy with propofol. Please follow written instructions given to you at your visit today.  Please pick up your prep kit at the pharmacy within the next 1-3 days.  

## 2011-11-05 NOTE — Progress Notes (Signed)
HISTORY OF PRESENT ILLNESS:  Andrew Jennings is a 51 y.o. 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 He presents today regarding the need for colonoscopy. The patient relocated from [place] to [place] within the past year. I have a large volume of records from [place] which I have reviewed. Patient has a history of chronic alternating bowel habits. Also history of minor rectal bleeding and a family history of colon polyps. He underwent complete colonoscopy in April of 2011. Her was a question of proctitis. However, colon biopsies were normal. He did have internal hemorrhoids as the source for bleeding. Also, multiple diminutive adenomas.finally, there was mild inflammation in the ileum. Biopsies revealed mild nonspecific ileitis. He was placed on Pentasa, which is continued on since. He tells me that he was contacted by his physician recommending followup colonoscopy at this time... He saw another gastroenterologist in [place] (Dr. Evette Cristal) but was dissatisfied without evaluation. I actually, saw his mother recently for colonoscopy. She recommended to him that he see me in this office,as he was dissatisfied with his GI visit elsewhere. The patient saw our previous nurse straight away for direct scheduling of this procedure. However, because of the number of complaints of the logistical issues, she did not schedule the procedure, but rather advised he see me in the office. Patient continues with alternating bowel habits. Intermittent left lower quadrant pain. Some minor bleeding with strain bowel movements. Some bloating. No nausea, vomiting, or weight loss. He has limited medical insight and is a Event organiser historian.  REVIEW OF SYSTEMS:  All non-GI ROS negative except for anxiety, back pain, cough, depression, fatigue, shortness of breath, insomnia, sore throat, excessive  urination  Past Medical History  Diagnosis Date  . Arthritis   . Depression   . Headache   . Glaucoma   . Allergy   . Arrhythmia   . Hyperlipidemia   . Hypothyroid   . Schizophrenia   . Laryngeal cancer     head & neck ca  . Sleep apnea   . Hemorrhoids   . Intestinal polyps   . Terminal ileitis   . Adenoma of large intestine     Past Surgical History  Procedure Date  . Neck surgery     BLOOD CLOT IN NECK  . Umbilical hernia repair   . Colonoscopy     Social History Andrew Jennings  reports that he quit smoking about 2 years ago. His smoking use included Cigarettes. He has never used smokeless tobacco. He reports that he does not drink alcohol or use illicit drugs.  family history includes Arthritis in his father and mother; Heart disease in his mother; and Hyperlipidemia in his mother.  There is no history of Colon cancer.  Allergies  Allergen Reactions  . Darvocet (Propoxacet-N) Other (See Comments)  . Penicillins        PHYSICAL EXAMINATION: Vital signs: BP 110/64  Pulse 80  Ht 5\' 7"  (1.702 m)  Wt 179 lb (81.194 kg)  BMI 28.04 kg/m2  Constitutional: generally well-appearing, no acute distress Psychiatric: alert and oriented x3, cooperative. Somewhat distracted Eyes: extraocular movements intact, anicteric, conjunctiva pink Mouth: oral pharynx moist, no lesions. Poor dentition Neck: supple no lymphadenopathy Cardiovascular: heart regular rate and rhythm, no murmur Lungs: clear to auscultation bilaterally Abdomen: soft, mild left lower quadrant tenderness to palpation, nondistended, no obvious ascites, no peritoneal signs, normal bowel sounds, no organomegaly. Scar from umbilical hernia  repair well healed Rectal:deferred until colonoscopy Extremities: no lower extremity edema bilaterally Skin: no lesions on visible extremities Neuro: No focal deficits. Normal reflexes. No asterixis.     ASSESSMENT:  #1. Chronic alternating bowel habits, bloating,  abdominal discomfort. Likely irritable bowel syndrome #2. Adenomatous colon polyps April 2011 #3. Nonspecific ileitis on colonoscopy April 2011. On mesalamine. #4. History of rectal bleeding due to hemorrhoids #5. Multiple medical problems #6. Psychiatric issues   PLAN:  #1. Surveillance colonoscopy regarding polyps as well as reassess ileitis. If no ileitis, would discontinue mesalamine.The nature of the procedure, as well as the risks, benefits, and alternatives were carefully and thoroughly reviewed with the patient. Ample time for discussion and questions allowed. The patient understood, was satisfied, and agreed to proceed. Movi prep prescribed. The patient instructed on its use. Advised he must have a driver or care partner available during the entirety of his procedural process

## 2011-11-06 ENCOUNTER — Other Ambulatory Visit: Payer: Medicare HMO

## 2011-11-07 ENCOUNTER — Telehealth: Payer: Self-pay | Admitting: *Deleted

## 2011-11-07 ENCOUNTER — Telehealth: Payer: Self-pay | Admitting: Internal Medicine

## 2011-11-07 NOTE — Telephone Encounter (Signed)
Patient called to report that he has a AAA, he is not sure of the size and that he has sleep apnea. I asked him to bring his machine with him to his proced He states that he does use CPAP at night, but right now the hose on his machine is broken and hasn't been using it for a while. The patient states he left this information out of the office visit.  He is scheduled for a colonoscopy for 11/19/11.  Dr Marina Goodell please advise if it is ok to proceed with colon

## 2011-11-07 NOTE — Telephone Encounter (Signed)
INFORMATION PROVIDED CONCERNING PT.'S CT SCAN.

## 2011-11-07 NOTE — Telephone Encounter (Signed)
Left message for patient to call back  

## 2011-11-07 NOTE — Telephone Encounter (Signed)
Yes, it is fine to proceed. Thank him for the information, just the same.

## 2011-11-07 NOTE — Telephone Encounter (Signed)
Patient aware.  I will add this to his history

## 2011-11-18 ENCOUNTER — Ambulatory Visit (HOSPITAL_COMMUNITY)
Admission: RE | Admit: 2011-11-18 | Discharge: 2011-11-18 | Disposition: A | Discharge status: Home / Self Care | Payer: Medicare HMO | Source: Ambulatory Visit | Attending: Oncology | Admitting: Oncology

## 2011-11-18 DIAGNOSIS — C760 Malignant neoplasm of head, face and neck: Secondary | ICD-10-CM | POA: Insufficient documentation

## 2011-11-18 DIAGNOSIS — J438 Other emphysema: Secondary | ICD-10-CM | POA: Insufficient documentation

## 2011-11-18 DIAGNOSIS — R0602 Shortness of breath: Secondary | ICD-10-CM | POA: Insufficient documentation

## 2011-11-18 LAB — POCT I-STAT, CHEM 8
BUN: 14 mg/dL (ref 6–23)
Chloride: 107 mEq/L (ref 96–112)
HCT: 47 % (ref 39.0–52.0)
Potassium: 4.1 mEq/L (ref 3.5–5.1)
Sodium: 144 mEq/L (ref 135–145)

## 2011-11-18 MED ORDER — IOHEXOL 300 MG/ML  SOLN
80.0000 mL | Freq: Once | INTRAMUSCULAR | Status: AC | PRN
  Administered 2011-11-18: 80 mL via INTRAVENOUS

## 2011-11-19 ENCOUNTER — Encounter: Payer: Self-pay | Admitting: Internal Medicine

## 2011-11-19 ENCOUNTER — Ambulatory Visit (AMBULATORY_SURGERY_CENTER): Payer: Medicare HMO | Admitting: Internal Medicine

## 2011-11-19 ENCOUNTER — Telehealth: Payer: Self-pay | Admitting: Internal Medicine

## 2011-11-19 ENCOUNTER — Encounter: Payer: Medicare HMO | Admitting: Internal Medicine

## 2011-11-19 VITALS — BP 123/81 | HR 74 | Temp 98.6°F | Resp 16 | Ht 67.0 in | Wt 179.0 lb

## 2011-11-19 DIAGNOSIS — K5289 Other specified noninfective gastroenteritis and colitis: Secondary | ICD-10-CM

## 2011-11-19 DIAGNOSIS — Z86010 Personal history of colonic polyps: Secondary | ICD-10-CM

## 2011-11-19 DIAGNOSIS — Z1211 Encounter for screening for malignant neoplasm of colon: Secondary | ICD-10-CM

## 2011-11-19 DIAGNOSIS — K589 Irritable bowel syndrome without diarrhea: Secondary | ICD-10-CM

## 2011-11-19 DIAGNOSIS — K648 Other hemorrhoids: Secondary | ICD-10-CM

## 2011-11-19 MED ORDER — SODIUM CHLORIDE 0.9 % IV SOLN: 500.0000 mL | INTRAVENOUS | Status: DC

## 2011-11-19 NOTE — Telephone Encounter (Signed)
Per dr Marina Goodell it is fine for him to use his viagra as directed.pt instructed and verbalized understanding of such. ewm

## 2011-11-19 NOTE — Op Note (Signed)
[facility] [address] [place], [place]  [address]  COLONOSCOPY PROCEDURE REPORT  PATIENT:  Andrew Jennings, Andrew Jennings  MR#:  [id number] BIRTHDATE:  [birth year], 51 yrs. old  GENDER:  male ENDOSCOPIST:  Wilhemina Bonito. Eda Keys, MD REF. BY:  Office / Self PROCEDURE DATE:  11/19/2011 PROCEDURE:  Surveillance Colonoscopy ASA CLASS:  Class II INDICATIONS:  history of pre-cancerous (adenomatous) colon polyps, surveillance and high-risk screening ; colon 10-2009 in [place] w/ TAs. also GI c/o ? hx IBD elsewhere MEDICATIONS:   MAC sedation, administered by CRNA, propofol (Diprivan) 300 mg IV  DESCRIPTION OF PROCEDURE:   After the risks benefits and alternatives of the procedure were thoroughly explained, informed consent was obtained.  Digital rectal exam was performed and revealed no abnormalities.   The LB [id number] endoscope was introduced through the anus and advanced to the cecum, which was identified by both the appendix and ileocecal valve, without limitations.  The quality of the prep was excellent, using MoviPrep.  The instrument was then slowly withdrawn as the colon was fully examined. <<PROCEDUREIMAGES>>  FINDINGS:  The terminal ileum appeared normal.  A normal appearing cecum, ileocecal valve, and appendiceal orifice were identified. The ascending, hepatic flexure, transverse, splenic flexure, descending, sigmoid colon, and rectum appeared unremarkable.  No polyps or cancers were seen.   Retroflexed views in the rectum revealed internal hemorrhoids.    The time to cecum = 1:44 minutes. The scope was then withdrawn in  8:58  minutes from the cecum and the procedure completed.  COMPLICATIONS:  None  ENDOSCOPIC IMPRESSION: 1) Normal terminal ileum 2) Normal colon 3) No polyps or cancers 4) Small Internal hemorrhoids  RECOMMENDATIONS: 1) Follow up colonoscopy in 5 years  ______________________________ Wilhemina Bonito. Eda Keys, MD  CC:  Gordy Savers, MD;  The  Patient  n. eSIGNED:   Wilhemina Bonito. Eda Keys at 11/19/2011 11:03 AM  [place], Loraine Leriche, [phone]

## 2011-11-19 NOTE — Progress Notes (Signed)
Patient did not experience any of the following events: a burn prior to discharge; a fall within the facility; wrong site/side/patient/procedure/implant event; or a hospital transfer or hospital admission upon discharge from the facility. (G8907) Patient did not have preoperative order for IV antibiotic SSI prophylaxis. (G8918)  

## 2011-11-19 NOTE — Patient Instructions (Addendum)
CALL ASAP TO SCHEDULE A FOLLOW UP OFFICE VISIT IN 2 MONTHS FROM NOW  YOU HAD AN ENDOSCOPIC PROCEDURE TODAY AT THE [facility]: Refer to the procedure report that was given to you for any specific questions about what was found during the examination.  If the procedure report does not answer your questions, please call your gastroenterologist to clarify.  If you requested that your care partner not be given the details of your procedure findings, then the procedure report has been included in a sealed envelope for you to review at your convenience later.  YOU SHOULD EXPECT: Some feelings of bloating in the abdomen. Passage of more gas than usual.  Walking can help get rid of the air that was put into your GI tract during the procedure and reduce the bloating. If you had a lower endoscopy (such as a colonoscopy or flexible sigmoidoscopy) you may notice spotting of blood in your stool or on the toilet paper. If you underwent a bowel prep for your procedure, then you may not have a normal bowel movement for a few days.  DIET: Your first meal following the procedure should be a light meal and then it is ok to progress to your normal diet.  A half-sandwich or bowl of soup is an example of a good first meal.  Heavy or fried foods are harder to digest and may make you feel nauseous or bloated.  Likewise meals heavy in dairy and vegetables can cause extra gas to form and this can also increase the bloating.  Drink plenty of fluids but you should avoid alcoholic beverages for 24 hours.  ACTIVITY: Your care partner should take you home directly after the procedure.  You should plan to take it easy, moving slowly for the rest of the day.  You can resume normal activity the day after the procedure however you should NOT DRIVE or use heavy machinery for 24 hours (because of the sedation medicines used during the test).    SYMPTOMS TO REPORT IMMEDIATELY: A gastroenterologist can be reached at any hour.   During normal business hours, 8:30 AM to 5:00 PM Monday through Friday, call [phone].  After hours and on weekends, please call the GI answering service at [phone] who will take a message and have the physician on call contact you.   Following lower endoscopy (colonoscopy or flexible sigmoidoscopy):  Excessive amounts of blood in the stool  Significant tenderness or worsening of abdominal pains  Swelling of the abdomen that is new, acute  Fever of 100F or higher  FOLLOW UP: If any biopsies were taken you will be contacted by phone or by letter within the next 1-3 weeks.  Call your gastroenterologist if you have not heard about the biopsies in 3 weeks.  Our staff will call the home number listed on your records the next business day following your procedure to check on you and address any questions or concerns that you may have at that time regarding the information given to you following your procedure. This is a courtesy call and so if there is no answer at the home number and we have not heard from you through the emergency physician on call, we will assume that you have returned to your regular daily activities without incident.  SIGNATURES/CONFIDENTIALITY: You and/or your care partner have signed paperwork which will be entered into your electronic medical record.  These signatures attest to the fact that that the information above on your After Visit  Summary has been reviewed and is understood.  Full responsibility of the confidentiality of this discharge information lies with you and/or your care-partner.  

## 2011-11-20 ENCOUNTER — Telehealth: Payer: Self-pay | Admitting: *Deleted

## 2011-11-20 NOTE — Telephone Encounter (Deleted)
  Follow up Call-  Call back number 11/19/2011  Post procedure Call Back phone  # don't call  Permission to leave phone message Yes     Patient questions:  Do you have a fever, pain , or abdominal swelling? {yes no:314532} Pain Score  {NUMBERS; 0-10:5044} *  Have you tolerated food without any problems? {yes no:314532}  Have you been able to return to your normal activities? {yes no:314532}  Do you have any questions about your discharge instructions: Diet   {yes no:314532} Medications  {yes no:314532} Follow up visit  {yes no:314532}  Do you have questions or concerns about your Care? {yes no:314532}  Actions: * If pain score is 4 or above: {ACTION; [facility] ENDO PAIN >4:21563::"No action needed, pain <4."}

## 2011-11-21 ENCOUNTER — Ambulatory Visit (HOSPITAL_BASED_OUTPATIENT_CLINIC_OR_DEPARTMENT_OTHER): Payer: Medicare HMO | Admitting: Oncology

## 2011-11-21 ENCOUNTER — Ambulatory Visit (HOSPITAL_BASED_OUTPATIENT_CLINIC_OR_DEPARTMENT_OTHER): Payer: Medicare HMO

## 2011-11-21 ENCOUNTER — Telehealth: Payer: Self-pay | Admitting: Oncology

## 2011-11-21 VITALS — BP 123/82 | HR 84 | Temp 96.7°F | Ht 67.0 in | Wt 176.7 lb

## 2011-11-21 DIAGNOSIS — C329 Malignant neoplasm of larynx, unspecified: Secondary | ICD-10-CM

## 2011-11-21 DIAGNOSIS — E039 Hypothyroidism, unspecified: Secondary | ICD-10-CM

## 2011-11-21 LAB — CBC WITH DIFFERENTIAL/PLATELET
Basophils Absolute: 0 10*3/uL (ref 0.0–0.1)
EOS%: 1.5 % (ref 0.0–7.0)
Eosinophils Absolute: 0.1 10*3/uL (ref 0.0–0.5)
HGB: 14.8 g/dL (ref 13.0–17.1)
NEUT#: 3 10*3/uL (ref 1.5–6.5)
RDW: 13.8 % (ref 11.0–14.6)
lymph#: 1.1 10*3/uL (ref 0.9–3.3)
nRBC: 0 % (ref 0–0)

## 2011-11-21 LAB — COMPREHENSIVE METABOLIC PANEL
ALT: 12 U/L (ref 0–53)
AST: 18 U/L (ref 0–37)
CO2: 29 mEq/L (ref 19–32)
Chloride: 107 mEq/L (ref 96–112)
Sodium: 143 mEq/L (ref 135–145)
Total Bilirubin: 0.5 mg/dL (ref 0.3–1.2)
Total Protein: 6.7 g/dL (ref 6.0–8.3)

## 2011-11-21 LAB — TSH: TSH: 7.393 u[IU]/mL — ABNORMAL HIGH (ref 0.350–4.500)

## 2011-11-21 NOTE — Patient Instructions (Signed)
A.   Result of CT lung 11/18/2011.  CT CHEST WITH CONTRAST  Technique: Multidetector CT imaging of the chest was performed  following the standard protocol during bolus administration of  intravenous contrast.  Contrast: 80mL OMNIPAQUE IOHEXOL 300 MG/ML SOLN  Comparison: Chest CT 05/12/2011.  Findings: There are no enlarged mediastinal or hilar lymph nodes.  No significant vascular abnormalities are seen. The thoracic inlet  appears normal. A small amount of pericardial fluid or thickening  anteriorly is stable. There is no pleural effusion.  The lungs appear stable with mild emphysematous changes. There is  no evidence of pulmonary nodule or confluent airspace opacity.  There are no suspicious osseous findings. The visualized upper  abdomen appears unchanged.   IMPRESSION:  1. Stable examination. No evidence of metastatic disease or  thoracic malignancy.  2. Emphysema.   B.  Follow up:  In about 6 months.  Please also keep appointment with your ENT doctor.

## 2011-11-21 NOTE — Progress Notes (Signed)
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  s/p XRT to larynx which finished on 09/04/08.    CURRENT THERAPY:  watchful observation.  INTERVAL HISTORY: Andrew Jennings 51 y.o. male returns for regular follow up with his mother.  He reported doing well physically.  He still has anxiety, feeling nervous from his schizophrenia.  He is on Haldol with his psychiatrist.  He is not able to hold a job because of this.  He no longer smokes cigarettes which he quit in 2010.  He does not chew tobacco.  He drinks one-two beers a week.    Patient denies fatigue, fever, anorexia, weight loss, headache, visual changes, confusion, drenching night sweats, palpable lymph node swelling, mucositis, odynophagia, dysphagia, nausea vomiting, jaundice, chest pain, palpitation, shortness of breath, dyspnea on exertion, productive cough, gum bleeding, epistaxis, hematemesis, hemoptysis, abdominal pain, abdominal swelling, early satiety, melena, hematochezia, hematuria, skin rash, spontaneous bleeding, joint swelling, joint pain, heat or cold intolerance, bowel bladder incontinence, back pain, focal motor weakness, paresthesia, depression, suicidal or homocidal ideation, feeling hopelessness.   Past Medical History  Diagnosis Date  . Arthritis   . Depression   . Headache   . Glaucoma   . Allergy   . Arrhythmia   . Hyperlipidemia   . Hypothyroid   . Schizophrenia   . Laryngeal cancer     head & neck ca  . Sleep apnea   . Hemorrhoids   . Intestinal polyps   . Terminal ileitis   . Adenoma of large intestine   . AAA (abdominal aortic aneurysm)     Past Surgical History  Procedure Date  . Neck surgery     BLOOD CLOT IN NECK  . Umbilical hernia repair   . Colonoscopy     Current Outpatient  Prescriptions  Medication Sig Dispense Refill  . Choline Fenofibrate (TRILIPIX) 135 MG capsule Take 1 capsule (135 mg total) by mouth daily.  90 capsule  1  . diphenhydrAMINE (BENADRYL) 50 MG capsule Take 50 mg by mouth every 6 (six) hours as needed.        . haloperidol (HALDOL) 0.5 MG tablet TAKE THREE TABLETS BY MOUTH DAILY  270 tablet  1  . mesalamine (PENTASA) 250 MG CR capsule Take 1 capsule (250 mg total) by mouth 2 (two) times daily.  180 capsule  1  . rosuvastatin (CRESTOR) 5 MG tablet Take 1 tablet (5 mg total) by mouth at bedtime.  30 tablet  11  . sildenafil (VIAGRA) 100 MG tablet Take 1 tablet (100 mg total) by mouth as needed for erectile dysfunction.  20 tablet  1  . levothyroxine (SYNTHROID, LEVOTHROID) 125 MCG tablet Take 1 tablet (125 mcg total) by mouth daily.  90 tablet  3    ALLERGIES:  is allergic to darvocet and penicillins.  REVIEW OF SYSTEMS:  The rest of the 14-point review of system was negative.   Filed Vitals:   11/21/11 1113  BP: 123/82  Pulse: 84  Temp: 96.7 F (35.9 C)   Wt Readings from Last 3 Encounters:  11/21/11 176 lb 11.2 oz (80.151 kg)  11/19/11 179 lb (81.194 kg)  11/05/11 179 lb (81.194 kg)   ECOG Performance status: 0  PHYSICAL EXAMINATION:  General:  well-nourished in no acute distress.  Eyes:  no  scleral icterus.  ENT:  There were no oropharyngeal lesions.  Neck was without thyromegaly.  Lymphatics:  Negative cervical, supraclavicular or axillary adenopathy.  Respiratory: lungs were clear bilaterally without wheezing or crackles.  Cardiovascular:  Regular rate and rhythm, S1/S2, without murmur, rub or gallop.  There was no pedal edema.  GI:  abdomen was soft, flat, nontender, nondistended, without organomegaly.  Muscoloskeletal:  no spinal tenderness of palpation of vertebral spine.  Skin exam was without echymosis, petichae.  Neuro exam was nonfocal.  Patient was able to get on and off exam table without assistance.  Gait was normal.   Patient was alerted and oriented.  Attention was good.   Language was appropriate.  Mood was anxious appearing (which is not different than his usual baseline).  Speech was slightly pressured.  Thought content was not tangential.     LABORATORY/RADIOLOGY DATA:  Lab Results  Component Value Date   WBC 4.7 11/21/2011   HGB 14.8 11/21/2011   HCT 43.7 11/21/2011   PLT 236 11/21/2011   GLUCOSE 76 11/18/2011   CHOL 176 12/19/2010   TRIG 156.0* 12/19/2010   HDL 44.30 12/19/2010   LDLCALC 101* 12/19/2010   ALKPHOS 34* 04/07/2011   ALKPHOS 34* 04/07/2011   ALT 17 04/07/2011   ALT 17 04/07/2011   AST 24 04/07/2011   AST 24 04/07/2011   NA 144 11/18/2011   K 4.1 11/18/2011   CL 107 11/18/2011   CREATININE 1.40* 11/18/2011   BUN 14 11/18/2011   CO2 27 04/07/2011   CO2 27 04/07/2011    RADIOLOGY:  I personally reviewed the following CT chest and showed the patient the images.   Ct Chest W Contrast  11/18/2011  *RADIOLOGY REPORT*  Clinical Data: History of head and neck cancer status post radiation therapy.  Shortness of breath.  CT CHEST WITH CONTRAST  Technique:  Multidetector CT imaging of the chest was performed following the standard protocol during bolus administration of intravenous contrast.  Contrast: 80mL OMNIPAQUE IOHEXOL 300 MG/ML  SOLN  Comparison: Chest CT 05/12/2011.  Findings: There are no enlarged mediastinal or hilar lymph nodes. No significant vascular abnormalities are seen.  The thoracic inlet appears normal.  A small amount of pericardial fluid or thickening anteriorly is stable.  There is no pleural effusion.  The lungs appear stable with mild emphysematous changes.  There is no evidence of pulmonary nodule or confluent airspace opacity.  There are no suspicious osseous findings.  The visualized upper abdomen appears unchanged.  IMPRESSION:  1.  Stable examination.  No evidence of metastatic disease or thoracic malignancy. 2.  Emphysema.  Original Report Authenticated By: Gerrianne Scale, M.D.     ASSESSMENT AND PLAN:    1. History of left glottic laryngeal squamous cell carcinoma I discussed with Mr. Chrostowski and his mother that there is no clinical evidence of recurrence.  As he is more than 2 years out from the finish of therapy, there is no guideline to receive neck CT yearly.  As he has no symptoms locally, there is no reason for routine surveillance neck CT scan.  2. History of lung nodule when he was in [place].  He no longer has any lung nodule on his CT chest.  In the future, if he resumes smoking again, then he will meet criteria for surveillance or if he is symptomatic.  3. Hypothyroidism, radiation therapy-induced.  He is on levothyroxine.  TSH today is pending to see if there is need to adjust  dose of his Synthroid.  4. History of schizophrenia/bipolar disease on Haldol as needed.   Today, he appeared at baseline.    He will follow with [place] in clinic in about the 6 months.    The length of time of the face-to-face encounter was . More than 50% of time was spent counseling and coordination of care.

## 2011-11-21 NOTE — Telephone Encounter (Signed)
gv pt appt for nov2013.  sent pt to lab

## 2011-11-24 ENCOUNTER — Other Ambulatory Visit: Payer: Self-pay | Admitting: Oncology

## 2011-11-25 ENCOUNTER — Telehealth: Payer: Self-pay | Admitting: Internal Medicine

## 2011-11-25 NOTE — Telephone Encounter (Signed)
Pt scheduled to see Dr. Marina Goodell 01/21/12@1 :30pm. Pt aware of appt date and time.

## 2011-11-25 NOTE — Telephone Encounter (Signed)
Yes, have him come see me in 2-3 months

## 2011-11-25 NOTE — Telephone Encounter (Signed)
Pt called and wants to know if he needs a follow-up appt with Dr. Marina Goodell following his colon. No follow-up OV is mentioned on the procedure report. Dr. Marina Goodell please advise.

## 2011-12-17 ENCOUNTER — Encounter: Payer: Self-pay | Admitting: Internal Medicine

## 2011-12-17 ENCOUNTER — Ambulatory Visit (INDEPENDENT_AMBULATORY_CARE_PROVIDER_SITE_OTHER): Payer: Medicare HMO | Admitting: Internal Medicine

## 2011-12-17 VITALS — BP 106/72 | Temp 97.8°F | Wt 178.0 lb

## 2011-12-17 DIAGNOSIS — F319 Bipolar disorder, unspecified: Secondary | ICD-10-CM

## 2011-12-17 DIAGNOSIS — Z5181 Encounter for therapeutic drug level monitoring: Secondary | ICD-10-CM

## 2011-12-17 DIAGNOSIS — E039 Hypothyroidism, unspecified: Secondary | ICD-10-CM

## 2011-12-17 DIAGNOSIS — C329 Malignant neoplasm of larynx, unspecified: Secondary | ICD-10-CM

## 2011-12-17 DIAGNOSIS — E785 Hyperlipidemia, unspecified: Secondary | ICD-10-CM

## 2011-12-17 LAB — LDL CHOLESTEROL, DIRECT: Direct LDL: 140.1 mg/dL

## 2011-12-17 LAB — LIPID PANEL
Cholesterol: 205 mg/dL — ABNORMAL HIGH (ref 0–200)
Total CHOL/HDL Ratio: 4

## 2011-12-17 LAB — CK: Total CK: 76 U/L (ref 7–232)

## 2011-12-17 NOTE — Progress Notes (Signed)
Subjective:    Patient ID: Andrew Jennings, male    DOB: July 11, 1961, 51 y.o.   MRN: [id number]  HPI  51 year old patient who is seen today for followup. He has history of dyslipidemia and hypothyroidism. He was seen by oncology 3 weeks ago and a TSH was performed at that time. TSH was slightly elevated but he states that he had missed a number of daily dosages. He has a history of bipolar disorder and schizophrenia. He is followed by oncology for laryngeal cancer. He has dyslipidemia and remains on Crestor 5 mg daily.  Past Medical History  Diagnosis Date  . Arthritis   . Depression   . Headache   . Glaucoma   . Allergy   . Arrhythmia   . Hyperlipidemia   . Hypothyroid   . Schizophrenia   . Laryngeal cancer     head & neck ca  . Sleep apnea   . Hemorrhoids   . Intestinal polyps   . Terminal ileitis   . Adenoma of large intestine   . AAA (abdominal aortic aneurysm)     History   Social History  . Marital Status: Married    Spouse Name: N/A    Number of Children: N/A  . Years of Education: N/A   Occupational History  . Not on file.   Social History Main Topics  . Smoking status: Former Smoker    Types: Cigarettes    Quit date: 05/02/2009  . Smokeless tobacco: Never Used  . Alcohol Use: No  . Drug Use: No  . Sexually Active: Not on file   Other Topics Concern  . Not on file   Social History Narrative  . No narrative on file    Past Surgical History  Procedure Date  . Neck surgery     BLOOD CLOT IN NECK  . Umbilical hernia repair   . Colonoscopy     Family History  Problem Relation Age of Onset  . Arthritis Mother   . Hyperlipidemia Mother   . Heart disease Mother   . Arthritis Father   . Colon cancer Neg Hx     Allergies  Allergen Reactions  . Darvocet (Propoxyphene-Acetaminophen) Other (See Comments)  . Penicillins     Current Outpatient Prescriptions on File Prior to Visit  Medication Sig Dispense Refill  . Choline Fenofibrate (TRILIPIX)  135 MG capsule Take 1 capsule (135 mg total) by mouth daily.  90 capsule  1  . diphenhydrAMINE (BENADRYL) 50 MG capsule Take 50 mg by mouth every 6 (six) hours as needed.        . haloperidol (HALDOL) 0.5 MG tablet TAKE THREE TABLETS BY MOUTH DAILY  270 tablet  1  . levothyroxine (SYNTHROID, LEVOTHROID) 125 MCG tablet Take 1 tablet (125 mcg total) by mouth daily.  90 tablet  3  . mesalamine (PENTASA) 250 MG CR capsule Take 1 capsule (250 mg total) by mouth 2 (two) times daily.  180 capsule  1  . rosuvastatin (CRESTOR) 5 MG tablet Take 1 tablet (5 mg total) by mouth at bedtime.  30 tablet  11  . sildenafil (VIAGRA) 100 MG tablet Take 1 tablet (100 mg total) by mouth as needed for erectile dysfunction.  20 tablet  1    BP 106/72  Temp(Src) 97.8 F (36.6 C) (Oral)  Wt 178 lb (80.74 kg)     Review of Systems  Constitutional: Negative for fever, chills, appetite change and fatigue.  HENT: Negative for hearing loss, ear pain, congestion,  sore throat, trouble swallowing, neck stiffness, dental problem, voice change and tinnitus.   Eyes: Negative for pain, discharge and visual disturbance.  Respiratory: Negative for cough, chest tightness, wheezing and stridor.   Cardiovascular: Negative for chest pain, palpitations and leg swelling.  Gastrointestinal: Negative for nausea, vomiting, abdominal pain, diarrhea, constipation, blood in stool and abdominal distention.  Genitourinary: Negative for urgency, hematuria, flank pain, discharge, difficulty urinating and genital sores.  Musculoskeletal: Negative for myalgias, back pain, joint swelling, arthralgias and gait problem.  Skin: Negative for rash.  Neurological: Negative for dizziness, syncope, speech difficulty, weakness, numbness and headaches.  Hematological: Negative for adenopathy. Does not bruise/bleed easily.  Psychiatric/Behavioral: Negative for behavioral problems and dysphoric mood. The patient is not nervous/anxious.        Objective:    Physical Exam  Constitutional: He is oriented to person, place, and time. He appears well-developed.  HENT:  Head: Normocephalic.  Right Ear: External ear normal.  Left Ear: External ear normal.  Eyes: Conjunctivae and EOM are normal.  Neck: Normal range of motion.  Cardiovascular: Normal rate and normal heart sounds.   Pulmonary/Chest: Breath sounds normal.  Abdominal: Bowel sounds are normal.  Musculoskeletal: Normal range of motion. He exhibits no edema and no tenderness.  Neurological: He is alert and oriented to person, place, and time.  Psychiatric: He has a normal mood and affect. His behavior is normal.          Assessment & Plan:   Dyslipidemia. We'll check a lipid profile. Continue Crestor 5 mg daily samples and a prescription dispensed Hypothyroidism. Compliance stressed we'll continue present dosing History of bipolar disorder and schizophrenia Ulcerative colitis. Follow GI History of laryngeal cancer. Followup oncology

## 2011-12-17 NOTE — Patient Instructions (Signed)
Limit your sodium (Salt) intake    It is important that you exercise regularly, at least 20 minutes 3 to 4 times per week.  If you develop chest pain or shortness of breath seek  medical attention.  Return in 6 months for follow-up  

## 2011-12-18 HISTORY — PX: TRANSTHORACIC ECHOCARDIOGRAM: SHX275

## 2011-12-19 ENCOUNTER — Telehealth: Payer: Self-pay | Admitting: Oncology

## 2011-12-19 ENCOUNTER — Telehealth: Payer: Self-pay | Admitting: Gastroenterology

## 2011-12-19 NOTE — Telephone Encounter (Signed)
I talked with patient today re: his thoracic aneurysm.  He is asymptomatic.  He said that he is seeing a cardiologist Dr. Sheffield Slider for this.

## 2011-12-19 NOTE — Telephone Encounter (Signed)
Spoke to pt about lab results. Gave him lipid panel results, he said he wanted a "Thyroid lab" done . I told him he had one done back in May, he said he knew he wanted one done again. I told him I would let Dr. Charm Rings nurse know.

## 2011-12-24 ENCOUNTER — Encounter: Payer: Medicare HMO | Admitting: Internal Medicine

## 2011-12-25 ENCOUNTER — Ambulatory Visit (INDEPENDENT_AMBULATORY_CARE_PROVIDER_SITE_OTHER): Payer: Medicare HMO | Admitting: Internal Medicine

## 2011-12-25 ENCOUNTER — Encounter: Payer: Self-pay | Admitting: Internal Medicine

## 2011-12-25 VITALS — BP 110/70 | Temp 98.0°F | Wt 179.0 lb

## 2011-12-25 DIAGNOSIS — G4733 Obstructive sleep apnea (adult) (pediatric): Secondary | ICD-10-CM

## 2011-12-25 DIAGNOSIS — Z9989 Dependence on other enabling machines and devices: Secondary | ICD-10-CM

## 2011-12-25 HISTORY — DX: Obstructive sleep apnea (adult) (pediatric): G47.33

## 2011-12-25 NOTE — Patient Instructions (Signed)
Call or return to clinic prn if these symptoms worsen or fail to improve as anticipated.

## 2011-12-25 NOTE — Progress Notes (Signed)
  Subjective:    Patient ID: Andrew Jennings, male    DOB: [birth year], 51 y.o.   MRN: [id number]  HPI  51 year old patient who is seen today requesting a prescription for a CPAP mask. He was evaluated for OSA in [place] [address] approximately 2-1/2 years ago. He has not used his CPAP machine in approximately 2 years. He complains of mild daytime sleepiness. Medical problems include a history of laryngeal cancer. He has treated hypothyroidism and dyslipidemia. He feels that he did better on CPAP and had a pressure support of 8 cm of water. He states that he did well with a full face mask. He also describes some mild disruptive sleep.    Review of Systems  Constitutional: Negative for fever, chills, appetite change and fatigue.  HENT: Negative for hearing loss, ear pain, congestion, sore throat, trouble swallowing, neck stiffness, dental problem, voice change and tinnitus.   Eyes: Negative for pain, discharge and visual disturbance.  Respiratory: Negative for cough, chest tightness, wheezing and stridor.   Cardiovascular: Negative for chest pain, palpitations and leg swelling.  Gastrointestinal: Negative for nausea, vomiting, abdominal pain, diarrhea, constipation, blood in stool and abdominal distention.  Genitourinary: Negative for urgency, hematuria, flank pain, discharge, difficulty urinating and genital sores.  Musculoskeletal: Negative for myalgias, back pain, joint swelling, arthralgias and gait problem.  Skin: Negative for rash.  Neurological: Negative for dizziness, syncope, speech difficulty, weakness, numbness and headaches.  Hematological: Negative for adenopathy. Does not bruise/bleed easily.  Psychiatric/Behavioral: Positive for disturbed wake/sleep cycle. Negative for behavioral problems and dysphoric mood. The patient is not nervous/anxious.        Objective:   Physical Exam  Constitutional: He appears well-developed and well-nourished. No distress.         Blood pressure low normal  HENT:       Slightly low hanging soft palate was some mild pharyngeal crowding  Pulmonary/Chest: Effort normal and breath sounds normal. No respiratory distress. He has no wheezes. He has no rales.          Assessment & Plan:   History of OSA documented on a sleep study 2 and half years ago. We'll provide a prescription for a full face mask and proper documentation. The patient will obtain records of his prior sleep study was either he or this office will forward for the needed documentation.

## 2011-12-29 ENCOUNTER — Encounter: Payer: Self-pay | Admitting: Internal Medicine

## 2012-01-12 ENCOUNTER — Other Ambulatory Visit: Payer: Self-pay | Admitting: Internal Medicine

## 2012-01-14 ENCOUNTER — Telehealth: Payer: Self-pay

## 2012-01-14 NOTE — Telephone Encounter (Signed)
Attempt to call- recv'd fax from [place] [employer] company out of [facility] requesting info on him r/t CPAP machine and supplies. I need to know where/which company he is planning to use. I just dont want to sent out info to a random supply company since this is one we dont typically use.

## 2012-01-19 ENCOUNTER — Telehealth: Payer: Self-pay | Admitting: Internal Medicine

## 2012-01-19 NOTE — Telephone Encounter (Signed)
If he's doing okay, we can postpone the followup at this time. I would like to see him at some point before the end of the year, if possible. Thanks

## 2012-01-19 NOTE — Telephone Encounter (Signed)
Pt aware.

## 2012-01-19 NOTE — Telephone Encounter (Signed)
Pt was scheduled for OV Wed 7/11 2-3 months post colon. Pt cancelled the OV, states that he cannot afford it at this time. Pt wants to know if he needs to reschedule the appt. Please advise.

## 2012-01-21 ENCOUNTER — Ambulatory Visit: Payer: Medicare HMO | Admitting: Internal Medicine

## 2012-02-12 ENCOUNTER — Other Ambulatory Visit: Payer: Self-pay

## 2012-02-12 MED ORDER — ROSUVASTATIN CALCIUM 5 MG PO TABS: 5.0000 mg | ORAL_TABLET | Freq: Every day | ORAL | Status: DC

## 2012-02-12 NOTE — Telephone Encounter (Signed)
Done

## 2012-03-08 ENCOUNTER — Ambulatory Visit (INDEPENDENT_AMBULATORY_CARE_PROVIDER_SITE_OTHER): Payer: Medicare HMO | Admitting: Internal Medicine

## 2012-03-08 ENCOUNTER — Encounter: Payer: Self-pay | Admitting: Internal Medicine

## 2012-03-08 VITALS — BP 90/70 | Temp 97.6°F | Wt 178.0 lb

## 2012-03-08 DIAGNOSIS — G4700 Insomnia, unspecified: Secondary | ICD-10-CM

## 2012-03-08 DIAGNOSIS — E039 Hypothyroidism, unspecified: Secondary | ICD-10-CM

## 2012-03-08 MED ORDER — ZOLPIDEM TARTRATE 5 MG PO TABS: 5.0000 mg | ORAL_TABLET | Freq: Every evening | ORAL | Status: DC | PRN

## 2012-03-08 NOTE — Patient Instructions (Signed)
Followup with Dr. Ladona Ridgel  Insomnia Insomnia is frequent trouble falling and/or staying asleep. Insomnia can be a long term problem or a short term problem. Both are common. Insomnia can be a short term problem when the wakefulness is related to a certain stress or worry. Long term insomnia is often related to ongoing stress during waking hours and/or poor sleeping habits. Overtime, sleep deprivation itself can make the problem worse. Every little thing feels more severe because you are overtired and your ability to cope is decreased. CAUSES    Stress, anxiety, and depression.   Poor sleeping habits.   Distractions such as TV in the bedroom.   Naps close to bedtime.   Engaging in emotionally charged conversations before bed.   Technical reading before sleep.   Alcohol and other sedatives. They may make the problem worse. They can hurt normal sleep patterns and normal dream activity.   Stimulants such as caffeine for several hours prior to bedtime.   Pain syndromes and shortness of breath can cause insomnia.   Exercise late at night.   Changing time zones may cause sleeping problems (jet lag).  It is sometimes helpful to have someone observe your sleeping patterns. They should look for periods of not breathing during the night (sleep apnea). They should also look to see how long those periods last. If you live alone or observers are uncertain, you can also be observed at a sleep clinic where your sleep patterns will be professionally monitored. Sleep apnea requires a checkup and treatment. Give your caregivers your medical history. Give your caregivers observations your family has made about your sleep.   SYMPTOMS    Not feeling rested in the morning.   Anxiety and restlessness at bedtime.   Difficulty falling and staying asleep.  TREATMENT    Your caregiver may prescribe treatment for an underlying medical disorders. Your caregiver can give advice or help if you are using alcohol  or other drugs for self-medication. Treatment of underlying problems will usually eliminate insomnia problems.   Medications can be prescribed for short time use. They are generally not recommended for lengthy use.   Over-the-counter sleep medicines are not recommended for lengthy use. They can be habit forming.   You can promote easier sleeping by making lifestyle changes such as:   Using relaxation techniques that help with breathing and reduce muscle tension.   Exercising earlier in the day.   Changing your diet and the time of your last meal. No night time snacks.   Establish a regular time to go to bed.   Counseling can help with stressful problems and worry.   Soothing music and white noise may be helpful if there are background noises you cannot remove.   Stop tedious detailed work at least one hour before bedtime.  HOME CARE INSTRUCTIONS    Keep a diary. Inform your caregiver about your progress. This includes any medication side effects. See your caregiver regularly. Take note of:   Times when you are asleep.   Times when you are awake during the night.   The quality of your sleep.   How you feel the next day.  This information will help your caregiver care for you.  Get out of bed if you are still awake after 15 minutes. Read or do some quiet activity. Keep the lights down. Wait until you feel sleepy and go back to bed.   Keep regular sleeping and waking hours. Avoid naps.   Exercise regularly.  Avoid distractions at bedtime. Distractions include watching television or engaging in any intense or detailed activity like attempting to balance the household checkbook.   Develop a bedtime ritual. Keep a familiar routine of bathing, brushing your teeth, climbing into bed at the same time each night, listening to soothing music. Routines increase the success of falling to sleep faster.   Use relaxation techniques. This can be using breathing and muscle tension release  routines. It can also include visualizing peaceful scenes. You can also help control troubling or intruding thoughts by keeping your mind occupied with boring or repetitive thoughts like the old concept of counting sheep. You can make it more creative like imagining planting one beautiful flower after another in your backyard garden.   During your day, work to eliminate stress. When this is not possible use some of the previous suggestions to help reduce the anxiety that accompanies stressful situations.  MAKE SURE YOU:    Understand these instructions.   Will watch your condition.   Will get help right away if you are not doing well or get worse.  Document Released: 06/27/2000 Document Revised: 06/19/2011 Document Reviewed: 07/28/2007 [facility] Patient Information 2012 [place], [place].

## 2012-03-08 NOTE — Progress Notes (Signed)
Subjective:    Patient ID: Andrew Jennings, male    DOB: May 16, 1961, 51 y.o.   MRN: [id number]  HPI  51 year old patient who is seen today for followup. He has a history of hypothyroidism and a TSH levels have been slightly elevated. Since his last visit here 2 months ago he estimates that he has missed 10  dosages. He is requesting a followup TSH. His chief complaint today is insomnia. He does have a history of bipolar disorder and is followed by psychiatry. He has been prescribed Valium in the past.  Past Medical History  Diagnosis Date  . Arthritis   . Depression   . Headache   . Glaucoma   . Allergy   . Arrhythmia   . Hyperlipidemia   . Hypothyroid   . Schizophrenia   . Laryngeal cancer     head & neck ca  . Sleep apnea   . Hemorrhoids   . Intestinal polyps   . Terminal ileitis   . Adenoma of large intestine   . AAA (abdominal aortic aneurysm)     History   Social History  . Marital Status: Married    Spouse Name: N/A    Number of Children: N/A  . Years of Education: N/A   Occupational History  . Not on file.   Social History Main Topics  . Smoking status: Former Smoker    Types: Cigarettes    Quit date: 05/02/2009  . Smokeless tobacco: Never Used  . Alcohol Use: No  . Drug Use: No  . Sexually Active: Not on file   Other Topics Concern  . Not on file   Social History Narrative  . No narrative on file    Past Surgical History  Procedure Date  . Neck surgery     BLOOD CLOT IN NECK  . Umbilical hernia repair   . Colonoscopy     Family History  Problem Relation Age of Onset  . Arthritis Mother   . Hyperlipidemia Mother   . Heart disease Mother   . Arthritis Father   . Colon cancer Neg Hx     Allergies  Allergen Reactions  . Darvocet (Propoxyphene-Acetaminophen) Other (See Comments)  . Penicillins     Current Outpatient Prescriptions on File Prior to Visit  Medication Sig Dispense Refill  . diphenhydrAMINE (BENADRYL) 50 MG capsule Take 50  mg by mouth every 6 (six) hours as needed.        . haloperidol (HALDOL) 0.5 MG tablet TAKE THREE TABLETS BY MOUTH DAILY  270 tablet  1  . levothyroxine (SYNTHROID, LEVOTHROID) 125 MCG tablet Take 1 tablet (125 mcg total) by mouth daily.  90 tablet  3  . mesalamine (PENTASA) 250 MG CR capsule Take 1 capsule (250 mg total) by mouth 2 (two) times daily.  180 capsule  1  . rosuvastatin (CRESTOR) 5 MG tablet Take 1 tablet (5 mg total) by mouth daily.  90 tablet  3  . sildenafil (VIAGRA) 100 MG tablet Take 1 tablet (100 mg total) by mouth as needed for erectile dysfunction.  20 tablet  1  . TRILIPIX 135 MG capsule TAKE ONE CAPSULE BY MOUTH AT BEDTIME  90 each  1    BP 90/70  Temp 97.6 F (36.4 C) (Oral)  Wt 178 lb (80.74 kg)       Review of Systems  Constitutional: Negative for fever, chills, appetite change and fatigue.  HENT: Negative for hearing loss, ear pain, congestion, sore throat, trouble swallowing, neck  stiffness, dental problem, voice change and tinnitus.   Eyes: Negative for pain, discharge and visual disturbance.  Respiratory: Negative for cough, chest tightness, wheezing and stridor.   Cardiovascular: Negative for chest pain, palpitations and leg swelling.  Gastrointestinal: Negative for nausea, vomiting, abdominal pain, diarrhea, constipation, blood in stool and abdominal distention.  Genitourinary: Negative for urgency, hematuria, flank pain, discharge, difficulty urinating and genital sores.  Musculoskeletal: Negative for myalgias, back pain, joint swelling, arthralgias and gait problem.  Skin: Negative for rash.  Neurological: Negative for dizziness, syncope, speech difficulty, weakness, numbness and headaches.  Hematological: Negative for adenopathy. Does not bruise/bleed easily.  Psychiatric/Behavioral: Positive for disturbed wake/sleep cycle. Negative for behavioral problems and dysphoric mood. The patient is not nervous/anxious.        Objective:   Physical Exam    Constitutional: He appears well-developed and well-nourished. No distress.          Assessment & Plan:   Insomnia. Sleep hygiene issues discussed. Information dispensed. We'll give a trial of Ambien. We'll follow up with psychiatry Hypothyroidism. Compliance stressed we'll check a TSH

## 2012-03-09 LAB — TSH: TSH: 2.07 u[IU]/mL (ref 0.35–5.50)

## 2012-03-09 NOTE — Progress Notes (Signed)
Quick Note:  spke with pt- informed lab normal continue current meds ______

## 2012-03-11 ENCOUNTER — Other Ambulatory Visit: Payer: Self-pay | Admitting: Internal Medicine

## 2012-03-31 ENCOUNTER — Telehealth: Payer: Self-pay | Admitting: Oncology

## 2012-03-31 NOTE — Telephone Encounter (Signed)
Pt requested to have afternoon appt. Changed time 8.00 to 2:00pm

## 2012-03-31 NOTE — Telephone Encounter (Signed)
LVM regarding appt time change on 11.11.13.....sed

## 2012-03-31 NOTE — Telephone Encounter (Signed)
Mailed appt time schedule change.   sed

## 2012-04-01 ENCOUNTER — Telehealth: Payer: Self-pay | Admitting: *Deleted

## 2012-04-01 NOTE — Telephone Encounter (Signed)
Opened in error

## 2012-04-19 ENCOUNTER — Other Ambulatory Visit: Payer: Self-pay | Admitting: Internal Medicine

## 2012-05-24 ENCOUNTER — Ambulatory Visit: Payer: Medicare HMO | Admitting: Oncology

## 2012-05-24 ENCOUNTER — Other Ambulatory Visit: Payer: Medicare HMO | Admitting: Lab

## 2012-05-25 NOTE — Patient Instructions (Addendum)
1.  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 s/p XRT to larynx which finished on 09/04/08.   2.  CURRENT THERAPY: watchful observation.  3.  FOLLOW UP:  Once yearly at the [facility].  Please also keep appointment with your Ear/Nose/Throat doctor at least once a year.

## 2012-05-26 ENCOUNTER — Telehealth: Payer: Self-pay | Admitting: Oncology

## 2012-05-26 ENCOUNTER — Ambulatory Visit (HOSPITAL_BASED_OUTPATIENT_CLINIC_OR_DEPARTMENT_OTHER): Payer: Medicare HMO | Admitting: Oncology

## 2012-05-26 ENCOUNTER — Other Ambulatory Visit (HOSPITAL_BASED_OUTPATIENT_CLINIC_OR_DEPARTMENT_OTHER): Payer: Medicare HMO | Admitting: Lab

## 2012-05-26 VITALS — BP 113/76 | HR 82 | Temp 97.5°F | Resp 20 | Ht 67.0 in | Wt 180.5 lb

## 2012-05-26 DIAGNOSIS — C329 Malignant neoplasm of larynx, unspecified: Secondary | ICD-10-CM

## 2012-05-26 DIAGNOSIS — E039 Hypothyroidism, unspecified: Secondary | ICD-10-CM

## 2012-05-26 DIAGNOSIS — C320 Malignant neoplasm of glottis: Secondary | ICD-10-CM

## 2012-05-26 DIAGNOSIS — E890 Postprocedural hypothyroidism: Secondary | ICD-10-CM

## 2012-05-26 LAB — TSH: TSH: 3.268 u[IU]/mL (ref 0.350–4.500)

## 2012-05-26 LAB — COMPREHENSIVE METABOLIC PANEL (CC13)
ALT: 19 U/L (ref 0–55)
AST: 20 U/L (ref 5–34)
Albumin: 4.1 g/dL (ref 3.5–5.0)
Alkaline Phosphatase: 47 U/L (ref 40–150)
Calcium: 10 mg/dL (ref 8.4–10.4)
Chloride: 110 mEq/L — ABNORMAL HIGH (ref 98–107)
Potassium: 4.4 mEq/L (ref 3.5–5.1)
Sodium: 144 mEq/L (ref 136–145)

## 2012-05-26 LAB — CBC WITH DIFFERENTIAL/PLATELET
BASO%: 0.6 % (ref 0.0–2.0)
EOS%: 1.4 % (ref 0.0–7.0)
HCT: 44 % (ref 38.4–49.9)
MCH: 30.6 pg (ref 27.2–33.4)
MCHC: 34 g/dL (ref 32.0–36.0)
MONO#: 0.6 10*3/uL (ref 0.1–0.9)
RBC: 4.89 10*6/uL (ref 4.20–5.82)
RDW: 13.7 % (ref 11.0–14.6)
WBC: 6.6 10*3/uL (ref 4.0–10.3)
lymph#: 1.7 10*3/uL (ref 0.9–3.3)

## 2012-05-26 NOTE — Telephone Encounter (Signed)
gv and printed appt schedule to pt for May 2014 °

## 2012-05-27 ENCOUNTER — Telehealth: Payer: Self-pay | Admitting: *Deleted

## 2012-05-27 ENCOUNTER — Telehealth: Payer: Self-pay

## 2012-05-27 NOTE — Progress Notes (Signed)
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 s/p XRT to larynx which finished on 09/04/08.   CURRENT THERAPY: watchful observation.  INTERVAL HISTORY: Andrew Jennings 51 y.o. male returns for regular follow up by himself.  He reports feeling well in term of head/neck.  He has mild residual xerostomia.  He has no restriction in food intake.  He denied dysphagia, odynophagia, mucositis, neck node.  He reports no smoking or chewing tobacco at this time.  He was seen by Dr. Marina Goodell by GI and had a normal colonoscopy by report.  He does have alternating constipation, diarrhea.  He was placed on Pentasa by Dr. Marina Goodell for what I guessed is clinical diagnosis of inflammatory bowel disease.    Past Medical History  Diagnosis Date  . Arthritis   . Depression   . Headache   . Glaucoma   . Allergy   . Arrhythmia   . Hyperlipidemia   . Hypothyroid   . Schizophrenia   . Laryngeal cancer     head & neck ca  . Sleep apnea   . Hemorrhoids   . Intestinal polyps   . Terminal ileitis   . Adenoma of large intestine   . AAA (abdominal aortic aneurysm)     Past Surgical History  Procedure Date  . Neck surgery     BLOOD CLOT IN NECK  . Umbilical hernia repair   . Colonoscopy     Current Outpatient Prescriptions  Medication Sig Dispense Refill  . diphenhydrAMINE (BENADRYL) 50 MG capsule Take 50 mg by mouth every 6 (six) hours as needed.        . haloperidol (HALDOL) 0.5 MG tablet 10 mg. Take 3.5 tablets daily; Total Dosage 35mg .      . levothyroxine (SYNTHROID, LEVOTHROID) 125 MCG tablet TAKE 1 TABLET (125 MCG TOTAL) BY MOUTH DAILY.  90 tablet  1  . PENTASA 250 MG CR capsule TAKE 1 CAPSULE (250 MG TOTAL) BY MOUTH 2 (TWO) TIMES DAILY.  180 capsule  1  . rosuvastatin (CRESTOR) 5 MG tablet Take 1 tablet  (5 mg total) by mouth daily.  90 tablet  3  . TRILIPIX 135 MG capsule TAKE ONE CAPSULE BY MOUTH AT BEDTIME  90 each  1  . sildenafil (VIAGRA) 100 MG tablet Take 1 tablet (100 mg total) by mouth as needed for erectile dysfunction.  20 tablet  1    ALLERGIES:  is allergic to darvocet and penicillins.  REVIEW OF SYSTEMS:  The rest of the 14-point review of system was negative.   Filed Vitals:   05/26/12 1401  BP: 113/76  Pulse: 82  Temp: 97.5 F (36.4 C)  Resp: 20   Wt Readings from Last 3 Encounters:  05/26/12 180 lb 8 oz (81.874 kg)  03/08/12 178 lb (80.74 kg)  12/25/11 179 lb (81.194 kg)   ECOG Performance status: 0  PHYSICAL EXAMINATION:   General: well-nourished in no acute distress. Eyes: no scleral icterus. ENT: There were no oropharyngeal lesions. Neck was without thyromegaly. Lymphatics: Negative cervical, supraclavicular or axillary adenopathy. Respiratory: lungs were clear bilaterally without wheezing or crackles. Cardiovascular: Regular rate and rhythm, S1/S2, without murmur, rub or gallop. There was no pedal edema. GI: abdomen was soft, flat, nontender, nondistended, without organomegaly. Muscoloskeletal: no spinal tenderness of palpation of vertebral spine. Skin  exam was without echymosis, petichae. Neuro exam was nonfocal. Patient was able to get on and off exam table without assistance. Gait was normal. Patient was alerted and oriented. Attention was good. Language was appropriate. Mood was anxious appearing (which is not different than his usual baseline). Speech was slightly pressured. Thought content was not tangential.    LABORATORY/RADIOLOGY DATA:  Lab Results  Component Value Date   WBC 6.6 05/26/2012   HGB 15.0 05/26/2012   HCT 44.0 05/26/2012   PLT 231 05/26/2012   GLUCOSE 102* 05/26/2012   CHOL 205* 12/17/2011   TRIG 90.0 12/17/2011   HDL 51.50 12/17/2011   LDLDIRECT 140.1 12/17/2011   LDLCALC 101* 12/19/2010   ALKPHOS 47 05/26/2012   ALT 19 05/26/2012   AST  20 05/26/2012   NA 144 05/26/2012   K 4.4 05/26/2012   CL 110* 05/26/2012   CREATININE 1.2 05/26/2012   BUN 19.0 05/26/2012   CO2 27 05/26/2012    ASSESSMENT AND PLAN:   1. History of left glottic laryngeal squamous cell carcinoma:  Continue to be in remission.  As he is more than 2 years out, I will defer any more routine surveillance CT scan unless he has concerning symptoms.  He agreed with this.   2. History of lung nodule when he was in [place]. He no longer has any lung nodule on his CT chest. In the future, if he resumes smoking again, then he will meet criteria for surveillance or if he is symptomatic.  3. Hypothyroidism, radiation therapy-induced. He is on levothyroxine. TSH was within normal range.  No change in dose.  4. History of schizophrenia/bipolar disease on Haldol as needed. Today, he appeared at baseline.  5. Irritable bowel syndrome symptoms.  He is on Pentasa.  I will request note from Dr. Lamar Sprinkles office for our record.     The length of time of the face-to-face encounter was 10   minutes. More than 50% of time was spent counseling and coordination of care.

## 2012-05-27 NOTE — Telephone Encounter (Signed)
Dr. Lodema Pilot office called and wants to see if the pt needs to still be taking Pentasa. Per OV note with Dr. Marina Goodell dated 11/05/11 pt was to have colon and if no ileitis mesalamine would be stopped. Pt was to have OV in July but he called and stated that he could not afford to come. Per Dr. Marina Goodell pt needs to be seen sometime before the end of the year. Dr. Marina Goodell please advise regarding the Pentasa.

## 2012-05-27 NOTE — Telephone Encounter (Signed)
Called RN, Bonita Quin, at Dr. Lamar Sprinkles office to clarify if pt is to continue on Pentasa or not per Dr. Gaylyn Rong.

## 2012-05-27 NOTE — Telephone Encounter (Signed)
If he cannot afford the medication, then discontinue. Office followup at his convenience.

## 2012-05-27 NOTE — Telephone Encounter (Signed)
Left message for pt to call back  °

## 2012-05-27 NOTE — Telephone Encounter (Signed)
Pt aware and OV scheduled.

## 2012-06-15 ENCOUNTER — Encounter: Payer: Self-pay | Admitting: Internal Medicine

## 2012-06-15 ENCOUNTER — Ambulatory Visit (INDEPENDENT_AMBULATORY_CARE_PROVIDER_SITE_OTHER): Payer: Medicare HMO | Admitting: Internal Medicine

## 2012-06-15 VITALS — BP 110/76 | HR 88 | Temp 97.5°F | Resp 18 | Wt 174.0 lb

## 2012-06-15 DIAGNOSIS — J069 Acute upper respiratory infection, unspecified: Secondary | ICD-10-CM

## 2012-06-15 NOTE — Patient Instructions (Signed)
Take over-the-counter expectorants and cough medications such as  Mucinex DM.  Call if there is no improvement in 5 to 7 days or if he developed worsening cough, fever, or new symptoms, such as shortness of breath or chest pain. 

## 2012-06-15 NOTE — Progress Notes (Signed)
Subjective:    Patient ID: Andrew Jennings, male    DOB: 12-16-1960, 51 y.o.   MRN: [id number]  HPI  51 year old patient who is seen today for followup. He has a one-week history of cough. Cough has been mildly productive of yellow sputum. No wheezing shortness of breath chest pain fever or chills. His mother has also been ill in appearance and an MI one week ago and is still hospitalized. He has hypothyroidism and a history of bipolar disorder.  Past Medical History  Diagnosis Date  . Arthritis   . Depression   . Headache   . Glaucoma   . Allergy   . Arrhythmia   . Hyperlipidemia   . Hypothyroid   . Schizophrenia   . Laryngeal cancer     head & neck ca  . Sleep apnea   . Hemorrhoids   . Intestinal polyps   . Terminal ileitis   . Adenoma of large intestine   . AAA (abdominal aortic aneurysm)     History   Social History  . Marital Status: Married    Spouse Name: N/A    Number of Children: N/A  . Years of Education: N/A   Occupational History  . Not on file.   Social History Main Topics  . Smoking status: Former Smoker    Types: Cigarettes    Quit date: 05/02/2009  . Smokeless tobacco: Never Used  . Alcohol Use: No  . Drug Use: No  . Sexually Active: Not on file   Other Topics Concern  . Not on file   Social History Narrative  . No narrative on file    Past Surgical History  Procedure Date  . Neck surgery     BLOOD CLOT IN NECK  . Umbilical hernia repair   . Colonoscopy     Family History  Problem Relation Age of Onset  . Arthritis Mother   . Hyperlipidemia Mother   . Heart disease Mother   . Arthritis Father   . Colon cancer Neg Hx     Allergies  Allergen Reactions  . Darvocet (Propoxyphene-Acetaminophen) Other (See Comments)  . Penicillins     Current Outpatient Prescriptions on File Prior to Visit  Medication Sig Dispense Refill  . diphenhydrAMINE (BENADRYL) 50 MG capsule Take 50 mg by mouth every 6 (six) hours as needed.        .  haloperidol (HALDOL) 0.5 MG tablet 10 mg. Take 3.5 tablets daily; Total Dosage 35mg .      . levothyroxine (SYNTHROID, LEVOTHROID) 125 MCG tablet TAKE 1 TABLET (125 MCG TOTAL) BY MOUTH DAILY.  90 tablet  1  . PENTASA 250 MG CR capsule TAKE 1 CAPSULE (250 MG TOTAL) BY MOUTH 2 (TWO) TIMES DAILY.  180 capsule  1  . rosuvastatin (CRESTOR) 5 MG tablet Take 1 tablet (5 mg total) by mouth daily.  90 tablet  3  . sildenafil (VIAGRA) 100 MG tablet Take 1 tablet (100 mg total) by mouth as needed for erectile dysfunction.  20 tablet  1  . TRILIPIX 135 MG capsule TAKE ONE CAPSULE BY MOUTH AT BEDTIME  90 each  1    BP 110/76  Pulse 88  Temp 97.5 F (36.4 C) (Oral)  Resp 18  Wt 174 lb (78.926 kg)  SpO2 97%       Review of Systems  Constitutional: Negative for fever, chills, appetite change and fatigue.  HENT: Positive for congestion. Negative for hearing loss, ear pain, sore throat, trouble swallowing, neck  stiffness, dental problem, voice change and tinnitus.   Eyes: Negative for pain, discharge and visual disturbance.  Respiratory: Positive for cough. Negative for chest tightness, wheezing and stridor.   Cardiovascular: Negative for chest pain, palpitations and leg swelling.  Gastrointestinal: Negative for nausea, vomiting, abdominal pain, diarrhea, constipation, blood in stool and abdominal distention.  Genitourinary: Negative for urgency, hematuria, flank pain, discharge, difficulty urinating and genital sores.  Musculoskeletal: Negative for myalgias, back pain, joint swelling, arthralgias and gait problem.  Skin: Negative for rash.  Neurological: Negative for dizziness, syncope, speech difficulty, weakness, numbness and headaches.  Hematological: Negative for adenopathy. Does not bruise/bleed easily.  Psychiatric/Behavioral: Negative for behavioral problems and dysphoric mood. The patient is not nervous/anxious.        Objective:   Physical Exam  Constitutional: He is oriented to person,  place, and time. He appears well-developed.  HENT:  Head: Normocephalic.  Right Ear: External ear normal.  Left Ear: External ear normal.       Oropharynx slightly erythematous  Eyes: Conjunctivae normal and EOM are normal.  Neck: Normal range of motion.  Cardiovascular: Normal rate and normal heart sounds.   Pulmonary/Chest: Breath sounds normal.  Abdominal: Bowel sounds are normal.  Musculoskeletal: Normal range of motion. He exhibits no edema and no tenderness.  Neurological: He is alert and oriented to person, place, and time.  Psychiatric: He has a normal mood and affect. His behavior is normal.          Assessment & Plan:    Viral URI with cough. We'll treat symptomatically Hypothyroidism Dyslipidemia

## 2012-06-22 ENCOUNTER — Ambulatory Visit (INDEPENDENT_AMBULATORY_CARE_PROVIDER_SITE_OTHER): Payer: Medicare HMO | Admitting: Internal Medicine

## 2012-06-22 ENCOUNTER — Encounter: Payer: Self-pay | Admitting: Internal Medicine

## 2012-06-22 VITALS — BP 98/60 | HR 76 | Ht 67.0 in | Wt 172.6 lb

## 2012-06-22 DIAGNOSIS — K529 Noninfective gastroenteritis and colitis, unspecified: Secondary | ICD-10-CM

## 2012-06-22 DIAGNOSIS — Z860100 Personal history of colon polyps, unspecified: Secondary | ICD-10-CM

## 2012-06-22 DIAGNOSIS — K589 Irritable bowel syndrome without diarrhea: Secondary | ICD-10-CM

## 2012-06-22 DIAGNOSIS — R197 Diarrhea, unspecified: Secondary | ICD-10-CM

## 2012-06-22 DIAGNOSIS — K5289 Other specified noninfective gastroenteritis and colitis: Secondary | ICD-10-CM

## 2012-06-22 NOTE — Patient Instructions (Signed)
Please follow up with Dr. Perry in a year 

## 2012-06-22 NOTE — Progress Notes (Signed)
HISTORY OF PRESENT ILLNESS:  Andrew Jennings is a 51 y.o. 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 He presents today for followup. He was initially evaluated 11/05/2011 regarding the need for colonoscopy. The patient relocated from [place] state to [place] within the past year. I had a large volume of records from [place] reviewed. Patient has a history of chronic alternating bowel habits. Also, a history of minor rectal bleeding and a family history of colon polyps. He underwent complete colonoscopy in April of 2011. Her was a question of proctitis. However, colon biopsies were normal. He did have internal hemorrhoids as the source for bleeding. Also, multiple diminutive adenomas. Finally, there was mild inflammation in the ileum. Biopsies revealed mild nonspecific ileitis. He was placed on Pentasa, which is continued on since. He tells me that he was contacted by his physician recommending followup colonoscopy at the time of his last visit. He subsequently underwent surveillance colonoscopy on 11/19/2011. The examination was completely normal with normal colonic mucosa and ileal mucosa. No polyps or cancers. Small internal hemorrhoids noted. He has continued on Pentasa 500 mg daily. He has had some reluctance and discontinuing the medication. Review of outside laboratories from last month shows a normal CBC with hemoglobin 15.0 and normal comprehensive metabolic panel. He continues with occasional intermittent loose stools, as he has had for some time. His GI review of systems is essentially positive as is his non-GI review of systems.   REVIEW OF SYSTEMS:  All non-GI ROS negative except for sinus and allergy, arthritis, anxiety, back pain, visual change, cough, fatigue, night sweats, sore throat, sleeping problems, headaches, heart rhythm changes  Past Medical History  Diagnosis Date   . Arthritis   . Depression   . Headache   . Glaucoma   . Allergy   . Arrhythmia   . Hyperlipidemia   . Hypothyroid   . Schizophrenia   . Laryngeal cancer     head & neck ca  . Sleep apnea   . Hemorrhoids   . Intestinal polyps   . Terminal ileitis   . Adenoma of large intestine   . AAA (abdominal aortic aneurysm)     Past Surgical History  Procedure Date  . Neck surgery     BLOOD CLOT IN NECK  . Umbilical hernia repair   . Colonoscopy     Social History Andrew Jennings  reports that he quit smoking about 3 years ago. His smoking use included Cigarettes. He has never used smokeless tobacco. He reports that he drinks alcohol. He reports that he does not use illicit drugs.  family history includes Arthritis in his father and mother; Heart disease in his mother; and Hyperlipidemia in his mother.  There is no history of Colon cancer.  Allergies  Allergen Reactions  . Darvocet (Propoxyphene-Acetaminophen) Other (See Comments)  . Penicillins        PHYSICAL EXAMINATION: Vital signs: BP 98/60  Pulse 76  Ht 5\' 7"  (1.702 m)  Wt 172 lb 9.6 oz (78.291 kg)  BMI 27.03 kg/m2 General: Well-developed, well-nourished, no acute distress HEENT: Sclerae are anicteric, conjunctiva pink. Oral mucosa intact Lungs: Clear Heart: Regular Abdomen: soft, nontender, nondistended, no obvious ascites, no peritoneal signs, normal bowel sounds. No organomegaly. Extremities: No edema Psychiatric: alert and oriented x3. Cooperative    ASSESSMENT:  #1. Irritable bowel #2. No evidence for inflammatory bowel disease #3. Schizophrenia/bipolar disorder #4. History of adenomatous colon  polyps. Negative colonoscopy May 2013  PLAN:  #1. Recommended to discontinue Pentasa. He is uncomfortable with this stating that his diarrhea may worsen. As such, he is agreeable to decrease the dosage to one tablet or 250 mg daily #2. Surveillance colonoscopy May 2018 #3. Routine GI followup in one year

## 2012-07-21 ENCOUNTER — Other Ambulatory Visit: Payer: Self-pay | Admitting: Internal Medicine

## 2012-07-22 NOTE — Telephone Encounter (Signed)
Pt following up on Viagra request.

## 2012-07-22 NOTE — Telephone Encounter (Signed)
ok 

## 2012-08-09 ENCOUNTER — Telehealth: Payer: Self-pay | Admitting: Internal Medicine

## 2012-08-09 ENCOUNTER — Ambulatory Visit: Payer: Self-pay | Admitting: Family

## 2012-08-09 ENCOUNTER — Ambulatory Visit (INDEPENDENT_AMBULATORY_CARE_PROVIDER_SITE_OTHER): Payer: Medicare HMO | Admitting: Family Medicine

## 2012-08-09 ENCOUNTER — Encounter: Payer: Self-pay | Admitting: Family Medicine

## 2012-08-09 VITALS — BP 110/70 | Temp 97.5°F | Wt 176.0 lb

## 2012-08-09 DIAGNOSIS — R079 Chest pain, unspecified: Secondary | ICD-10-CM

## 2012-08-09 NOTE — Progress Notes (Signed)
  Subjective:    Patient ID: Andrew Jennings, male    DOB: 12-22-1960, 52 y.o.   MRN: [id number]  HPI Pleasant is a 52 year old single male nonsmoker originally from [place] who comes in today for evaluation of chest pain  He states last Friday he was sitting and developed a dull ache in the right side of his chest. It lasted for a few minutes and went light. He had no cardiac or pulmonary symptoms. He had one episode on Saturday and into episodes on Sunday. He came in today without an appointment for evaluation  His medication reviewed in detail. He states he was told he cannot take Motrin with Haldol. He also has a history of schizophrenia hypothyroidism inflammatory bowel disease hyperlipidemia and erectile dysfunction.     Review of Systems Review of systems otherwise negative pain is nonexertional    Objective:   Physical Exam  Well-developed male no acute distress cardiopulmonary exam normal EKG normal except for an occasional PVC which she states has been previously present      Assessment & Plan:  Chest wall pain reassured Tylenol 2 tabs 3 times a day when necessary

## 2012-08-09 NOTE — Telephone Encounter (Signed)
Patient Information:  Caller Name: Yovany  Phone: [phone]  Patient: Andrew Jennings, Andrew Jennings  Gender: Male  DOB: Jun 14, 1961  Age: 52 Years  PCP: Eleonore Chiquito [facility])  Office Follow Up:  Does the office need to follow up with this patient?: No  Instructions For The Office: N/A  RN Note:  Pt  Symptoms  Reason For Call & Symptoms: Dull chest pain for 3 days.  Intermittent pain lasing 2-3 minutes.  Pt kept saying he is not going to the ED and can't go to the ED.  Hard to get information from the caller.  Denies cough/Cold sx, or nausea.  When asked about SOB caller states he has some shortness of breath but mild.     Reviewed Health History In EMR: Yes, but not with pt  Reviewed Medications In EMR: yes but not with pt  Reviewed Allergies In EMR: Yes but not with pt  Reviewed Surgeries / Procedures: No  Date of Onset of Symptoms: 08/07/2012  Guideline(s) Used:  Chest Pain  Disposition Per Guideline:   See Today in Office  Reason For Disposition Reached:   Intermittent chest pains persist > 3 days  Advice Given:  Expected Course:  These mild chest pains usually disappear within 3 days.  Call Back If:  Severe chest pain  You become worse.  Appointment Scheduled:  08/09/2012 11:30:00 Appointment Scheduled Provider:  Adline Mango [facility])

## 2012-08-09 NOTE — Telephone Encounter (Signed)
Beth Ammerman 08/09/2012 10:05 AM Signed  Patient Information:  Caller Name: Raymel  Phone: [phone]  Patient: Andrew Jennings, Andrew Jennings  Gender: Male  DOB: 1961-07-06  Age: 52 Years  PCP: Eleonore Chiquito [facility])  Office Follow Up:  Does the office need to follow up with this patient?: Yes Instructions For The Office: Pt refuses ED.  Requests to be seen in office today.  No available appts.  Please f/u with pt. RN Note:  Pt  Symptoms  Reason For Call & Symptoms: Dull chest pain for 3 days. Intermittent pain lasing 2-3 minutes. Pt kept saying he is not going to the ED and can't go to the ED. Hard to get information from the caller. Denies cough/Cold sx, or nausea. When asked about SOB caller states he has some shortness of breath but mild.  Reviewed Health History In EMR: Yes, but not with pt  Reviewed Medications In EMR: yes but not with pt  Reviewed Allergies In EMR: Yes but not with pt  Reviewed Surgeries / Procedures: No  Date of Onset of Symptoms: 08/07/2012  Guideline(s) Used:  Chest Pain  Disposition Per Guideline:  See Today in Office  Reason For Disposition Reached:  Intermittent chest pains persist > 3 days  Advice Given:  Expected Course:  These mild chest pains usually disappear within 3 days.  Call Back If:  Severe chest pain  You become worse.

## 2012-08-09 NOTE — Telephone Encounter (Signed)
Patient came over as a walk in. Pt sched w/Dr. Tawanna Cooler. Encounter closed.

## 2012-09-18 ENCOUNTER — Other Ambulatory Visit: Payer: Self-pay | Admitting: Internal Medicine

## 2012-11-24 ENCOUNTER — Other Ambulatory Visit: Payer: Self-pay | Admitting: Oncology

## 2012-11-24 ENCOUNTER — Ambulatory Visit (HOSPITAL_BASED_OUTPATIENT_CLINIC_OR_DEPARTMENT_OTHER): Payer: Medicare HMO | Admitting: Oncology

## 2012-11-24 ENCOUNTER — Telehealth: Payer: Self-pay | Admitting: Oncology

## 2012-11-24 ENCOUNTER — Other Ambulatory Visit (HOSPITAL_BASED_OUTPATIENT_CLINIC_OR_DEPARTMENT_OTHER): Payer: Medicare HMO

## 2012-11-24 VITALS — BP 98/57 | HR 89 | Temp 97.6°F | Resp 20 | Ht 68.0 in | Wt 175.7 lb

## 2012-11-24 DIAGNOSIS — M6282 Rhabdomyolysis: Secondary | ICD-10-CM

## 2012-11-24 DIAGNOSIS — Z8521 Personal history of malignant neoplasm of larynx: Secondary | ICD-10-CM

## 2012-11-24 DIAGNOSIS — E890 Postprocedural hypothyroidism: Secondary | ICD-10-CM

## 2012-11-24 DIAGNOSIS — C329 Malignant neoplasm of larynx, unspecified: Secondary | ICD-10-CM

## 2012-11-24 DIAGNOSIS — E039 Hypothyroidism, unspecified: Secondary | ICD-10-CM

## 2012-11-24 LAB — CBC WITH DIFFERENTIAL/PLATELET
Basophils Absolute: 0 10*3/uL (ref 0.0–0.1)
Eosinophils Absolute: 0.1 10*3/uL (ref 0.0–0.5)
HCT: 47.8 % (ref 38.4–49.9)
HGB: 16.1 g/dL (ref 13.0–17.1)
NEUT#: 4.5 10*3/uL (ref 1.5–6.5)
RDW: 13.6 % (ref 11.0–14.6)
lymph#: 1.6 10*3/uL (ref 0.9–3.3)

## 2012-11-24 LAB — COMPREHENSIVE METABOLIC PANEL (CC13)
Albumin: 4.1 g/dL (ref 3.5–5.0)
CO2: 25 mEq/L (ref 22–29)
Calcium: 10 mg/dL (ref 8.4–10.4)
Chloride: 109 mEq/L — ABNORMAL HIGH (ref 98–107)
Glucose: 95 mg/dl (ref 70–99)
Potassium: 4.3 mEq/L (ref 3.5–5.1)
Sodium: 143 mEq/L (ref 136–145)
Total Protein: 7.3 g/dL (ref 6.4–8.3)

## 2012-11-24 NOTE — Progress Notes (Signed)
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 s/p XRT to larynx which finished on 09/04/08.   CURRENT THERAPY: watchful observation.  INTERVAL HISTORY: Andrew Jennings 52 y.o. male returns for regular follow up by himself.  He reports feeling well.  He denied smoking, chewing tobacco.  He has good appetite and stable weight.  He denied dysphagia, odynophagia, neck swelling, hoarse voice.  He recently stopped mesalamine.  He denied abdominal pain, cramping, diarrhea, GI bleeding.  The rest of the 14 point review of system was negative.     Past Medical History  Diagnosis Date  . Arthritis   . Depression   . Headache   . Glaucoma   . Allergy   . Arrhythmia   . Hyperlipidemia   . Hypothyroid   . Schizophrenia   . Laryngeal cancer     head & neck ca  . Sleep apnea   . Hemorrhoids   . Intestinal polyps   . Terminal ileitis   . Adenoma of large intestine   . AAA (abdominal aortic aneurysm)     Past Surgical History  Procedure Laterality Date  . Neck surgery      BLOOD CLOT IN NECK  . Umbilical hernia repair    . Colonoscopy      Current Outpatient Prescriptions  Medication Sig Dispense Refill  . Choline Fenofibrate (FENOFIBRIC ACID) 135 MG CPDR       . diphenhydrAMINE (BENADRYL) 50 MG capsule Take 50 mg by mouth every 6 (six) hours as needed.        . haloperidol (HALDOL) 0.5 MG tablet 10 mg. Take 3.5 tablets daily; Total Dosage 35mg .      . levothyroxine (SYNTHROID, LEVOTHROID) 25 MCG tablet Take 35 mcg by mouth daily. States takes one 30 mcg and one 5 mcg to equal 35 mcg total daily      . rosuvastatin (CRESTOR) 5 MG tablet Take 1 tablet (5 mg total) by mouth daily.  90 tablet  3  . sildenafil (VIAGRA) 100 MG tablet Take 100 mg by mouth as needed.       No current  facility-administered medications for this visit.    ALLERGIES:  is allergic to darvocet and penicillins.  REVIEW OF SYSTEMS:  The rest of the 14-point review of system was negative.   Filed Vitals:   11/24/12 1403  BP: 98/57  Pulse: 89  Temp: 97.6 F (36.4 C)  Resp: 20   Wt Readings from Last 3 Encounters:  11/24/12 175 lb 11.2 oz (79.697 kg)  08/09/12 176 lb (79.833 kg)  06/22/12 172 lb 9.6 oz (78.291 kg)   ECOG Performance status: 0  PHYSICAL EXAMINATION:   General: well-nourished man in no acute distress. Eyes: no scleral icterus. ENT: There were no oropharyngeal lesions. Neck was without thyromegaly. Lymphatics: Negative cervical, supraclavicular or axillary adenopathy. Respiratory: lungs were clear bilaterally without wheezing or crackles. Cardiovascular: Regular rate and rhythm, S1/S2, without murmur, rub or gallop. There was no pedal edema. GI: abdomen was soft, flat, nontender, nondistended, without organomegaly. Muscoloskeletal: no spinal tenderness of palpation of vertebral spine. Skin exam was without echymosis, petichae. Neuro exam was nonfocal. Patient was able to get on and off exam table without assistance. Gait was normal. Patient was alerted and oriented. Attention was good. Language was appropriate. Mood was anxious  which is baseline for him). Speech was normal and not pressued. Thought content was not tangential.    LABORATORY/RADIOLOGY DATA:  Lab Results  Component Value Date   WBC 7.0 11/24/2012   HGB 16.1 11/24/2012   HCT 47.8 11/24/2012   PLT 265 11/24/2012   GLUCOSE 102* 05/26/2012   CHOL 205* 12/17/2011   TRIG 90.0 12/17/2011   HDL 51.50 12/17/2011   LDLDIRECT 140.1 12/17/2011   LDLCALC 101* 12/19/2010   ALKPHOS 47 05/26/2012   ALT 19 05/26/2012   AST 20 05/26/2012   NA 144 05/26/2012   K 4.4 05/26/2012   CL 110* 05/26/2012   CREATININE 1.2 05/26/2012   BUN 19.0 05/26/2012   CO2 27 05/26/2012    ASSESSMENT AND PLAN:   1. History of left glottic  laryngeal squamous cell carcinoma:  He is in remission.  2. History of lung nodule when he was in [place]. He no longer has any lung nodule on his CT chest. I requested CXR next visit in 6 months to rule out lung met from history of laryngeal cancer.  3. Hypothyroidism, radiation therapy-induced. He is on levothyroxine. TSH from today is still pending.   4. History of schizophrenia/bipolar disease on Haldol as needed. Today, he appeared at baseline.  5. History of irritable bowel syndrome:  He is now off of Pentasa without symptoms.   I informed the patient that I am leaving the practice.  [facility] will [facility] for him to see a new provider when he returns.      The length of time of the face-to-face encounter was 10 minutes. More than 50% of time was spent counseling and coordination of care.

## 2012-11-24 NOTE — Telephone Encounter (Signed)
Gave pt appt in November 2014 lab and MD

## 2012-11-25 ENCOUNTER — Telehealth: Payer: Self-pay | Admitting: *Deleted

## 2012-11-25 ENCOUNTER — Telehealth: Payer: Self-pay | Admitting: Internal Medicine

## 2012-11-25 NOTE — Telephone Encounter (Signed)
Please advise, see note from Dr. Gaylyn Rong on 5/15 pt had labs on 5/14 and was told CK abnormal and need to follow up with PCP.

## 2012-11-25 NOTE — Telephone Encounter (Signed)
Please tell him that his TSH was normal.  Continue thyroid medication as before.  His CPK was elevated.  Please ask him to follow up with his doctor as to what to do.  We only got CPK for him to minimize blood draw from his PCP.  Please fax CPK result to his PCP.    Thanks.

## 2012-11-25 NOTE — Telephone Encounter (Signed)
Pt left VM asking about results of his lab work from yesterday?

## 2012-11-25 NOTE — Telephone Encounter (Signed)
PT called and stated that he was instructed to speak with his primary care regarding his recent lab results. Please assist the patient, at a time later that 10:00am.

## 2012-11-25 NOTE — Telephone Encounter (Signed)
Notify patient that all his labs were unremarkable except for a single muscle enzyme was mildly elevated. Suggest we repeat in about one week and to avoid vigorous physical activity for 2 days prior

## 2012-11-25 NOTE — Telephone Encounter (Signed)
Instructed pt TSH normal and continue current dose of Synthroid per Dr. Gaylyn Rong.  Also informed of elevated CK and to f/u w/ Dr. Lesia Hausen for this.   Routed lab results to Dr. Kirtland Bouchard.  Pt verbalized understanding.

## 2012-11-26 ENCOUNTER — Telehealth: Payer: Self-pay | Admitting: Internal Medicine

## 2012-11-26 NOTE — Telephone Encounter (Signed)
Spoke to patient told him that all his labs were unremarkable except for a single muscle enzyme was mildly elevated. Suggest we repeat in about one week and to avoid vigorous physical activity for 2 days prior per Dr. Amador Cunas. Pt verbalized understanding and stated he has an appointment on the 30th and will do then. Told him needs to be done next week? Pt stated why can't it be done on the 30th? Told pt I will check with Dr.K and get back to him on Monday. Pt verbalized understanding.

## 2012-11-26 NOTE — Telephone Encounter (Signed)
Caller: Andrew Jennings/Patient; Phone: [phone]; Reason for Call: Patient calling to find out lab test results.  Per Epic, patient advised that lab results unremarkable except for one muscle enzyme being mildly elevated.  Advised per Dr.  Amador Cunas to repeat the lab test in 1 week, and refrain from heavy physical activity 2 days prior to lab being drawn.  Patient verbalizes understanding; states has appt already and will return for labwork/appt.  Krs/can

## 2012-11-26 NOTE — Telephone Encounter (Signed)
Spoke to pt told me he is sleeping and to call back later. Told him okay.

## 2012-11-29 NOTE — Telephone Encounter (Signed)
Ok to do on the 30th.

## 2012-11-29 NOTE — Telephone Encounter (Signed)
Spoke to pt told him okay to wait till May 30th appt to have repeat lab work. Pt verbalized understanding.

## 2012-12-10 ENCOUNTER — Encounter: Payer: Self-pay | Admitting: Internal Medicine

## 2012-12-10 ENCOUNTER — Ambulatory Visit (INDEPENDENT_AMBULATORY_CARE_PROVIDER_SITE_OTHER): Payer: Medicare HMO | Admitting: Internal Medicine

## 2012-12-10 VITALS — BP 90/60 | HR 80 | Temp 98.1°F | Resp 18 | Ht 67.25 in | Wt 175.0 lb

## 2012-12-10 DIAGNOSIS — E039 Hypothyroidism, unspecified: Secondary | ICD-10-CM

## 2012-12-10 DIAGNOSIS — Z0000 Encounter for general adult medical examination without abnormal findings: Secondary | ICD-10-CM

## 2012-12-10 DIAGNOSIS — C329 Malignant neoplasm of larynx, unspecified: Secondary | ICD-10-CM

## 2012-12-10 DIAGNOSIS — F319 Bipolar disorder, unspecified: Secondary | ICD-10-CM

## 2012-12-10 DIAGNOSIS — F209 Schizophrenia, unspecified: Secondary | ICD-10-CM

## 2012-12-10 NOTE — Progress Notes (Signed)
Subjective:    Patient ID: Andrew Jennings, male    DOB: [birth year], 52 y.o.   MRN: [id number]  HPI  52 year old patient who is seen today for a preventive health examination. He has a history of laryngeal cancer and has recently been seen by oncology. Screening laboratory data reviewed. He has dyslipidemia hypothyroidism and a history of schizophrenia and bipolar disorder. He has OSA and a history of ulcerative colitis. In general doing well today.  Past Medical History  Diagnosis Date  . Arthritis   . Depression   . Headache(784.0)   . Glaucoma   . Allergy   . Arrhythmia   . Hyperlipidemia   . Hypothyroid   . Schizophrenia   . Laryngeal cancer     head & neck ca  . Sleep apnea   . Hemorrhoids   . Intestinal polyps   . Terminal ileitis   . Adenoma of large intestine   . AAA (abdominal aortic aneurysm)     History   Social History  . Marital Status: Married    Spouse Name: N/A    Number of Children: N/A  . Years of Education: N/A   Occupational History  . Not on file.   Social History Main Topics  . Smoking status: Former Smoker    Types: Cigarettes    Quit date: 05/02/2009  . Smokeless tobacco: Never Used  . Alcohol Use: Yes     Comment: occasional  . Drug Use: No  . Sexually Active: Not on file   Other Topics Concern  . Not on file   Social History Narrative  . No narrative on file    Past Surgical History  Procedure Laterality Date  . Neck surgery      BLOOD CLOT IN NECK  . Umbilical hernia repair    . Colonoscopy      Family History  Problem Relation Age of Onset  . Arthritis Mother   . Hyperlipidemia Mother   . Heart disease Mother   . Arthritis Father   . Colon cancer Neg Hx     Allergies  Allergen Reactions  . Darvocet (Propoxyphene-Acetaminophen) Other (See Comments)  . Penicillins     Current Outpatient Prescriptions on File Prior to Visit  Medication Sig Dispense Refill  . Choline Fenofibrate (FENOFIBRIC ACID) 135 MG CPDR        . diphenhydrAMINE (BENADRYL) 50 MG capsule Take 50 mg by mouth every 6 (six) hours as needed.        . haloperidol (HALDOL) 0.5 MG tablet 10 mg. Take 3.5 tablets daily; Total Dosage 35mg .      . levothyroxine (SYNTHROID, LEVOTHROID) 25 MCG tablet Take 35 mcg by mouth daily. States takes one 30 mcg and one 5 mcg to equal 35 mcg total daily      . rosuvastatin (CRESTOR) 5 MG tablet Take 1 tablet (5 mg total) by mouth daily.  90 tablet  3  . sildenafil (VIAGRA) 100 MG tablet Take 100 mg by mouth as needed.       No current facility-administered medications on file prior to visit.    BP 90/60  Pulse 80  Temp(Src) 98.1 F (36.7 C) (Oral)  Resp 18  Ht 5' 7.25" (1.708 m)  Wt 175 lb (79.379 kg)  BMI 27.21 kg/m2  SpO2 95%       Review of Systems  Constitutional: Negative for fever, chills, appetite change and fatigue.  HENT: Negative for hearing loss, ear pain, congestion, sore throat, trouble swallowing, neck  stiffness, dental problem, voice change and tinnitus.   Eyes: Negative for pain, discharge and visual disturbance.  Respiratory: Negative for cough, chest tightness, wheezing and stridor.   Cardiovascular: Negative for chest pain, palpitations and leg swelling.  Gastrointestinal: Negative for nausea, vomiting, abdominal pain, diarrhea, constipation, blood in stool and abdominal distention.  Genitourinary: Negative for urgency, hematuria, flank pain, discharge, difficulty urinating and genital sores.  Musculoskeletal: Negative for myalgias, back pain, joint swelling, arthralgias and gait problem.  Skin: Negative for rash.  Neurological: Negative for dizziness, syncope, speech difficulty, weakness, numbness and headaches.  Hematological: Negative for adenopathy. Does not bruise/bleed easily.  Psychiatric/Behavioral: Negative for behavioral problems and dysphoric mood. The patient is not nervous/anxious.        Objective:   Physical Exam  Constitutional: He appears  well-developed and well-nourished.  Blood pressure low normal  HENT:  Head: Normocephalic and atraumatic.  Right Ear: External ear normal.  Left Ear: External ear normal.  Nose: Nose normal.  Mouth/Throat: Oropharynx is clear and moist.  Eyes: Conjunctivae and EOM are normal. Pupils are equal, round, and reactive to light. No scleral icterus.  Neck: Normal range of motion. Neck supple. No JVD present. No thyromegaly present.  Cardiovascular: Regular rhythm, normal heart sounds and intact distal pulses.  Exam reveals no gallop and no friction rub.   No murmur heard. Pulmonary/Chest: Effort normal and breath sounds normal. He exhibits no tenderness.  Abdominal: Soft. Bowel sounds are normal. He exhibits no distension and no mass. There is no tenderness.  Genitourinary: Prostate normal and penis normal.  Uncircumcised  Musculoskeletal: Normal range of motion. He exhibits no edema and no tenderness.  Lymphadenopathy:    He has no cervical adenopathy.  Neurological: He is alert. He has normal reflexes. No cranial nerve deficit. Coordination normal.  Skin: Skin is warm and dry. No rash noted.  Psychiatric: He has a normal mood and affect. His behavior is normal.          Assessment & Plan:   Preventive health examination Dyslipidemia. We'll continue Crestor. In view of his history of elevated CPK we'll recommend discontinuation of fenofibrate Hypothyroidism  Recheck 1 year Followup oncology GI and psychiatry

## 2012-12-10 NOTE — Patient Instructions (Signed)
Discontinue fenofibrate    It is important that you exercise regularly, at least 20 minutes 3 to 4 times per week.  If you develop chest pain or shortness of breath seek  medical attention.  Return in one year for follow-up

## 2012-12-11 ENCOUNTER — Other Ambulatory Visit: Payer: Self-pay | Admitting: Internal Medicine

## 2012-12-27 ENCOUNTER — Other Ambulatory Visit: Payer: Self-pay | Admitting: Internal Medicine

## 2013-01-04 ENCOUNTER — Encounter: Payer: Self-pay | Admitting: Family Medicine

## 2013-01-04 ENCOUNTER — Ambulatory Visit (INDEPENDENT_AMBULATORY_CARE_PROVIDER_SITE_OTHER): Payer: Medicare HMO | Admitting: Family Medicine

## 2013-01-04 ENCOUNTER — Telehealth: Payer: Self-pay | Admitting: Internal Medicine

## 2013-01-04 VITALS — BP 120/72 | Temp 97.8°F | Wt 174.0 lb

## 2013-01-04 DIAGNOSIS — R0789 Other chest pain: Secondary | ICD-10-CM

## 2013-01-04 NOTE — Progress Notes (Addendum)
Chief Complaint  Patient presents with  . Chest Pain    HPI:  Acute visit for LUQ pain: -started this morning, but has had in the past recently - usually at night when sleeps in certain position or twists in bed -symptoms: medial LUQ pain, "pulling" mod "sharp" pain - lasts for a few seconds then goes away -intermittent for 1 hour this morning, now resolved -occured with walking and at rest -denies: fevers, cough, SOB, DOE, nausea, vomiting, radiation of pain, chest pressure diarrhea, changes in bowels   ROS: See pertinent positives and negatives per HPI.  Past Medical History  Diagnosis Date  . Arthritis   . Depression   . Headache(784.0)   . Glaucoma   . Allergy   . Arrhythmia   . Hyperlipidemia   . Hypothyroid   . Schizophrenia   . Laryngeal cancer     head & neck ca  . Sleep apnea   . Hemorrhoids   . Intestinal polyps   . Terminal ileitis   . Adenoma of large intestine   . AAA (abdominal aortic aneurysm)     Family History  Problem Relation Age of Onset  . Arthritis Mother   . Hyperlipidemia Mother   . Heart disease Mother   . Arthritis Father   . Colon cancer Neg Hx     History   Social History  . Marital Status: Married    Spouse Name: N/A    Number of Children: N/A  . Years of Education: N/A   Social History Main Topics  . Smoking status: Former Smoker    Types: Cigarettes    Quit date: 05/02/2009  . Smokeless tobacco: Never Used  . Alcohol Use: Yes     Comment: occasional  . Drug Use: No  . Sexually Active: None   Other Topics Concern  . None   Social History Narrative  . None    Current outpatient prescriptions:Choline Fenofibrate (FENOFIBRIC ACID) 135 MG CPDR, TAKE ONE CAPSULE BY MOUTH AT BEDTIME, Disp: 90 capsule, Rfl: 3;  CRESTOR 5 MG tablet, TAKE 1 TABLET (5 MG TOTAL) BY MOUTH DAILY., Disp: 90 tablet, Rfl: 2;  diphenhydrAMINE (BENADRYL) 50 MG capsule, Take 50 mg by mouth every 6 (six) hours as needed.  , Disp: , Rfl: ;  haloperidol  (HALDOL) 0.5 MG tablet, 10 mg. Take 3.5 tablets daily; Total Dosage 35mg ., Disp: , Rfl:  levothyroxine (SYNTHROID, LEVOTHROID) 25 MCG tablet, Take 35 mcg by mouth daily. States takes one 30 mcg and one 5 mcg to equal 35 mcg total daily, Disp: , Rfl: ;  sildenafil (VIAGRA) 100 MG tablet, Take 100 mg by mouth as needed., Disp: , Rfl:   EXAM:  Filed Vitals:   01/04/13 1253  BP: 120/72  Temp: 97.8 F (36.6 C)  P 84  Body mass index is 27.05 kg/(m^2).  GENERAL: vitals reviewed and listed above, alert, oriented, appears well hydrated and in no acute distress  HEENT: atraumatic, conjunttiva clear, no obvious abnormalities on inspection of external nose and ears  NECK: no obvious masses on inspection  LUNGS: clear to auscultation bilaterally, no wheezes, rales or rhonchi, good air movement  CV: HRRR, no peripheral edema  ABD: BS+, soft, NTTP, no rebound or guarding  MS: moves all extremities without noticeable abnormality TTP lower costal cartilage on L - palpation here reproduces patient's pain - he says "that's it"  PSYCH: pleasant and cooperative, no obvious depression or anxiety  ASSESSMENT AND PLAN:  Discussed the following assessment and  plan:  Atypical chest pain - Plan: EKG 12-Lead  -hx and exam suggest musculoskeletal chest wall pain, do not suggest CV, GI or pulm origin at all, symptoms reproduced with palpation -Vitals normal, EKG obtained and unchanged from prior 1 PVC, NSR, no signs of ACS -discussed other potential etiologies for CP , pt does not wish to pursue any further workup at this time -advised heat, tylenol prn per instructions, follow up with PCP, ED precuations -Patient advised to return or notify a doctor immediately if symptoms worsen or persist or new concerns arise.  Patient Instructions  Costochondritis Costochondritis (Tietze syndrome), or costochondral separation, is a swelling and irritation (inflammation) of the tissue (cartilage) that connects  your ribs with your breastbone (sternum). It may occur on its own (spontaneously), through damage caused by an accident (trauma), or simply from coughing or minor exercise. It may take up to 6 weeks to get better and longer if you are unable to be conservative in your activities. HOME CARE INSTRUCTIONS   Avoid exhausting physical activity. Try not to strain your ribs during normal activity. This would include any activities using chest, belly (abdominal), and side muscles, especially if heavy weights are used.  Use ice or heat for 15-20 minutes twice daily. Place the ice in a plastic bag, and place a towel between the bag of ice and your skin.  Only take over-the-counter or prescription medicines for pain, discomfort, or fever as directed by your caregiver. SEEK IMMEDIATE MEDICAL CARE IF:   Your pain increases or you are very uncomfortable.  You have a fever.  You develop difficulty with your breathing.  You cough up blood.  You develop worse chest pains, shortness of breath, sweating, or vomiting.  You develop new, unexplained problems (symptoms). MAKE SURE YOU:   Understand these instructions.  Will watch your condition.  Will get help right away if you are not doing well or get worse. Document Released: 04/09/2005 Document Revised: 09/22/2011 Document Reviewed: 02/16/2008 [facility] Patient Information 2014 [place], Lona Kettle, Dahlia Client R.

## 2013-01-04 NOTE — Telephone Encounter (Signed)
Pt stated that pain is below his heart.  He does not have money for the ER.  Per Dr Kirtland Bouchard he can not see him today so put pt with any physician.  Pt is coming in at 1pm to see Dr Selena Batten

## 2013-01-04 NOTE — Telephone Encounter (Signed)
Patient Information:  Caller Name: Sabin  Phone: [phone]  Patient: Andrew Jennings, Andrew Jennings  Gender: Male  DOB: Nov 04, 1960  Age: 52 Years  PCP: Eleonore Chiquito [facility])  Office Follow Up:  Does the office need to follow up with this patient?: Yes  Instructions For The Office: PT REFUSING 911 OR ED, PLEASE CALL BACK FOR APPT  RN Note:  Pt refusing 911 and ED, states he doesn't have money for ED visit or ambulance, requesting appt w/ Dr Amador Cunas.  No same day appts remaining.   Symptoms  Reason For Call & Symptoms: ER CALL.  Chest Pain  Reviewed Health History In EMR: N/A  Reviewed Medications In EMR: N/A  Reviewed Allergies In EMR: N/A  Reviewed Surgeries / Procedures: N/A  Date of Onset of Symptoms: 01/04/2013  Treatments Tried: ASA 81mg  prior to call  Treatments Tried Worked: No  Guideline(s) Used:  Chest Pain  Disposition Per Guideline:   Call EMS 911 Now  Reason For Disposition Reached:   Chest pain lasting longer than 5 minutes and ANY of the following:  Over 23 years old Over 72 years old and at least one cardiac risk factor (i.e., high blood pressure, diabetes, high cholesterol, obesity, smoker or strong family history of heart disease) Pain is crushing, pressure-like, or heavy  Took nitroglycerin and chest pain was not relieved History of heart disease (i.e., angina, heart attack, bypass surgery, angioplasty, CHF)  Advice Given:  N/A  Patient Refused Recommendation:  Patient Refused Care Advice  Request appt w/ MD

## 2013-01-04 NOTE — Patient Instructions (Signed)
Costochondritis Costochondritis (Tietze syndrome), or costochondral separation, is a swelling and irritation (inflammation) of the tissue (cartilage) that connects your ribs with your breastbone (sternum). It may occur on its own (spontaneously), through damage caused by an accident (trauma), or simply from coughing or minor exercise. It may take up to 6 weeks to get better and longer if you are unable to be conservative in your activities. HOME CARE INSTRUCTIONS   Avoid exhausting physical activity. Try not to strain your ribs during normal activity. This would include any activities using chest, belly (abdominal), and side muscles, especially if heavy weights are used.  Use ice or heat for 15-20 minutes twice daily. Place the ice in a plastic bag, and place a towel between the bag of ice and your skin.  Only take over-the-counter or prescription medicines for pain, discomfort, or fever as directed by your caregiver. SEEK IMMEDIATE MEDICAL CARE IF:   Your pain increases or you are very uncomfortable.  You have a fever.  You develop difficulty with your breathing.  You cough up blood.  You develop worse chest pains, shortness of breath, sweating, or vomiting.  You develop new, unexplained problems (symptoms). MAKE SURE YOU:   Understand these instructions.  Will watch your condition.  Will get help right away if you are not doing well or get worse. Document Released: 04/09/2005 Document Revised: 09/22/2011 Document Reviewed: 02/16/2008 [facility] Patient Information 2014 [place], [place].

## 2013-02-08 ENCOUNTER — Other Ambulatory Visit: Payer: Self-pay | Admitting: *Deleted

## 2013-02-08 DIAGNOSIS — R079 Chest pain, unspecified: Secondary | ICD-10-CM

## 2013-02-14 ENCOUNTER — Encounter: Payer: Self-pay | Admitting: Cardiovascular Disease

## 2013-02-15 ENCOUNTER — Telehealth: Payer: Self-pay | Admitting: Cardiovascular Disease

## 2013-02-15 NOTE — Telephone Encounter (Signed)
Returned call.  Pt informed lab results not in paper chart.  Informed JC, LPN will be notified to review correspondence for Dr. Alanda Amass.  Pt verbalized understanding and agreed w/ plan.    Message forwarded to [facility]. Berlinda Last, LPN.

## 2013-02-15 NOTE — Telephone Encounter (Signed)
Wants to know if you are going to get his lab from  Dr Ha,or does he need to take another test?

## 2013-02-15 NOTE — Telephone Encounter (Signed)
Pt. Informed to have labs he gets done to dr. Alanda Amass. Pt. Stated understanding of instructions

## 2013-02-21 ENCOUNTER — Other Ambulatory Visit (INDEPENDENT_AMBULATORY_CARE_PROVIDER_SITE_OTHER): Payer: Medicare HMO

## 2013-02-21 ENCOUNTER — Other Ambulatory Visit: Payer: Self-pay | Admitting: Internal Medicine

## 2013-02-21 ENCOUNTER — Ambulatory Visit: Payer: Medicare HMO | Admitting: Internal Medicine

## 2013-02-21 DIAGNOSIS — E782 Mixed hyperlipidemia: Secondary | ICD-10-CM

## 2013-02-21 LAB — LIPID PANEL
HDL: 39.4 mg/dL (ref >39.00)
VLDL: 51.8 mg/dL — ABNORMAL HIGH (ref 0.0–40.0)

## 2013-02-21 LAB — LDL CHOLESTEROL, DIRECT: Direct LDL: 143 mg/dL

## 2013-03-03 ENCOUNTER — Telehealth: Payer: Self-pay | Admitting: Cardiovascular Disease

## 2013-03-03 ENCOUNTER — Ambulatory Visit (HOSPITAL_COMMUNITY)
Admission: RE | Admit: 2013-03-03 | Discharge: 2013-03-03 | Disposition: A | Discharge status: Home / Self Care | Payer: Medicare HMO | Source: Ambulatory Visit | Attending: Cardiovascular Disease | Admitting: Cardiovascular Disease

## 2013-03-03 DIAGNOSIS — Z87891 Personal history of nicotine dependence: Secondary | ICD-10-CM | POA: Insufficient documentation

## 2013-03-03 DIAGNOSIS — R079 Chest pain, unspecified: Secondary | ICD-10-CM

## 2013-03-03 DIAGNOSIS — Z8249 Family history of ischemic heart disease and other diseases of the circulatory system: Secondary | ICD-10-CM | POA: Insufficient documentation

## 2013-03-03 DIAGNOSIS — R5381 Other malaise: Secondary | ICD-10-CM | POA: Insufficient documentation

## 2013-03-03 DIAGNOSIS — J438 Other emphysema: Secondary | ICD-10-CM | POA: Insufficient documentation

## 2013-03-03 DIAGNOSIS — E663 Overweight: Secondary | ICD-10-CM | POA: Insufficient documentation

## 2013-03-03 HISTORY — PX: CARDIOVASCULAR STRESS TEST: SHX262

## 2013-03-03 MED ORDER — REGADENOSON 0.4 MG/5ML IV SOLN
0.4000 mg | Freq: Once | INTRAVENOUS | Status: AC
  Administered 2013-03-03: 0.4 mg via INTRAVENOUS

## 2013-03-03 MED ORDER — TECHNETIUM TC 99M SESTAMIBI GENERIC - CARDIOLITE
10.0000 | Freq: Once | INTRAVENOUS | Status: AC | PRN
  Administered 2013-03-03: 10 via INTRAVENOUS

## 2013-03-03 MED ORDER — TECHNETIUM TC 99M SESTAMIBI GENERIC - CARDIOLITE
30.0000 | Freq: Once | INTRAVENOUS | Status: AC | PRN
  Administered 2013-03-03: 30 via INTRAVENOUS

## 2013-03-03 NOTE — Procedures (Addendum)
[facility] [address] [place] [address] [place] [address] [phone]  Cardiology Nuclear Med Study  Andrew Jennings is a 52 y.o. male     MRN : [id number]     DOB: Apr 17, 1961  Procedure Date: 03/03/2013  Nuclear Med Background Indication for Stress Test:  Evaluation for Ischemia History:  Emphysema Cardiac Risk Factors: Family History - CAD, History of Smoking, Lipids and Overweight  Symptoms:  Chest Pain, Fatigue and SOB   Nuclear Pre-Procedure Caffeine/Decaff Intake:  1:00am NPO After: 11AM   IV Site: R Hand  IV 0.9% NS with Angio Cath:  22g  Chest Size (in):  42" IV Started by: Emmit Pomfret, RN  Height: 5\' 7"  (1.702 m)  Cup Size: n/a  BMI:  Body mass index is 28.03 kg/(m^2). Weight:  179 lb (81.194 kg)   Tech Comments:  N/A    Nuclear Med Study 1 or 2 day study: 1 day  Stress Test Type:  Lexiscan  Order Authorizing Provider:  Susa Griffins, MD   Resting Radionuclide: Technetium 60m Sestamibi  Resting Radionuclide Dose: 10.6 mCi   Stress Radionuclide:  Technetium 38m Sestamibi  Stress Radionuclide Dose: 30.8 mCi           Stress Protocol Rest HR: 72 Stress HR: 109  Rest BP: 100/80 Stress BP:112/62  Exercise Time (min): n/a METS: n/a          Dose of Adenosine (mg):  n/a Dose of Lexiscan: 0.4 mg  Dose of Atropine (mg): n/a Dose of Dobutamine: n/a mcg/kg/min (at max HR)  Stress Test Technologist: [name], CCT Nuclear Technologist: [name], CNMT   Rest Procedure:  Myocardial perfusion imaging was performed at rest 45 minutes following the intravenous administration of Technetium 70m Sestamibi. Stress Procedure:  The patient received IV Lexiscan 0.4 mg over 15-seconds.  Technetium 64m Sestamibi injected at 30-seconds.  There were no significant changes with Lexiscan.  Quantitative spect images were obtained after a 45 minute delay.  Transient Ischemic Dilatation (Normal <1.22):  1.14 Lung/Heart Ratio  (Normal <0.45):  0.27 QGS EDV:  110 ml QGS ESV:  65 ml LV Ejection Fraction: 41%  Signed by  [name], CNMT  PHYSICIAN INTERPRETATION  Rest ECG: NSR with non-specific ST-T wave changes  Stress ECG: No significant change from baseline ECG and No significant ST segment change suggestive of ischemia.  QPS Raw Data Images:  Patient motion noted.  Increased splanchnic tracer uptake partially obscures the inferoseptal wall.  Does not interfere with the ability to interpret the study, but does lead to a partially distotred evaluation of wall motion. The original image alignment was completely off leading to misinterpretation of the transition point from myocardial tissue to fibrous tissue in the septum in the stress images. Stress Images:  Normal homogeneous uptake in all areas of the myocardium. Rest Images:  Normal homogeneous uptake in all areas of the myocardium. Subtraction (SDS):  There is no evidence of scar or ischemia.  Impression Exercise Capacity:  Lexiscan with no exercise. BP Response:  Normal blood pressure response. Clinical Symptoms:  There is dyspnea. ECG Impression:  No significant ECG changes with Lexiscan. Comparison with Prior Nuclear Study: No previous nuclear study performed  Overall Impression:  Normal stress nuclear study. and Low risk stress nuclear study with no evidence of ischemia or infarction.  LV Wall Motion:  NL LV Function; NL Wall Motion -- on direct visualization, no wall motion abnormality is noted.   Andrew Lex, MD  03/03/2013 4:10 PM

## 2013-03-03 NOTE — Telephone Encounter (Signed)
Had stress test  Today-wants to know if he can take his [employer] today?

## 2013-03-04 NOTE — Telephone Encounter (Signed)
Returned call.  Pt informed result has been received, but not read and interpreted by Dr. Alanda Amass.  Pt informed Dr. Alanda Amass will be back in the office on Tuesday and will review result then.  Pt verbalized understanding and agreed w/ plan.

## 2013-03-11 ENCOUNTER — Other Ambulatory Visit: Payer: Self-pay | Admitting: Internal Medicine

## 2013-04-28 ENCOUNTER — Other Ambulatory Visit: Payer: Self-pay | Admitting: Oncology

## 2013-04-29 ENCOUNTER — Telehealth: Payer: Self-pay | Admitting: Hematology and Oncology

## 2013-04-29 NOTE — Telephone Encounter (Signed)
s.w. pt and advised on 11.14.14 appt time change...pt ok and aware

## 2013-05-26 ENCOUNTER — Other Ambulatory Visit: Payer: Self-pay | Admitting: Hematology and Oncology

## 2013-05-26 DIAGNOSIS — E039 Hypothyroidism, unspecified: Secondary | ICD-10-CM

## 2013-05-26 DIAGNOSIS — C329 Malignant neoplasm of larynx, unspecified: Secondary | ICD-10-CM

## 2013-05-27 ENCOUNTER — Other Ambulatory Visit (HOSPITAL_BASED_OUTPATIENT_CLINIC_OR_DEPARTMENT_OTHER): Payer: Commercial Managed Care - HMO

## 2013-05-27 ENCOUNTER — Ambulatory Visit (HOSPITAL_BASED_OUTPATIENT_CLINIC_OR_DEPARTMENT_OTHER): Payer: Medicare HMO | Admitting: Hematology and Oncology

## 2013-05-27 ENCOUNTER — Encounter (INDEPENDENT_AMBULATORY_CARE_PROVIDER_SITE_OTHER): Payer: Self-pay

## 2013-05-27 ENCOUNTER — Telehealth: Payer: Self-pay | Admitting: *Deleted

## 2013-05-27 ENCOUNTER — Ambulatory Visit: Payer: Medicare HMO | Admitting: Oncology

## 2013-05-27 ENCOUNTER — Ambulatory Visit (HOSPITAL_COMMUNITY)
Admission: RE | Admit: 2013-05-27 | Discharge: 2013-05-27 | Disposition: A | Discharge status: Home / Self Care | Payer: Medicare HMO | Source: Ambulatory Visit | Attending: Oncology | Admitting: Oncology

## 2013-05-27 ENCOUNTER — Other Ambulatory Visit: Payer: Medicare HMO | Admitting: Lab

## 2013-05-27 VITALS — BP 128/88 | HR 75 | Temp 96.6°F | Resp 18 | Ht 67.0 in | Wt 176.9 lb

## 2013-05-27 DIAGNOSIS — Z8521 Personal history of malignant neoplasm of larynx: Secondary | ICD-10-CM

## 2013-05-27 DIAGNOSIS — E039 Hypothyroidism, unspecified: Secondary | ICD-10-CM

## 2013-05-27 DIAGNOSIS — C329 Malignant neoplasm of larynx, unspecified: Secondary | ICD-10-CM

## 2013-05-27 LAB — CBC WITH DIFFERENTIAL/PLATELET
BASO%: 0.7 % (ref 0.0–2.0)
Basophils Absolute: 0 10*3/uL (ref 0.0–0.1)
EOS%: 1.2 % (ref 0.0–7.0)
HCT: 48.4 % (ref 38.4–49.9)
HGB: 16.1 g/dL (ref 13.0–17.1)
LYMPH%: 22.6 % (ref 14.0–49.0)
MCH: 29.2 pg (ref 27.2–33.4)
MCHC: 33.2 g/dL (ref 32.0–36.0)
MONO#: 0.5 10*3/uL (ref 0.1–0.9)
MONO%: 8.1 % (ref 0.0–14.0)
NEUT%: 67.4 % (ref 39.0–75.0)
Platelets: 237 10*3/uL (ref 140–400)
RBC: 5.5 10*6/uL (ref 4.20–5.82)
RDW: 13.8 % (ref 11.0–14.6)
WBC: 6.4 10*3/uL (ref 4.0–10.3)
lymph#: 1.4 10*3/uL (ref 0.9–3.3)

## 2013-05-27 LAB — COMPREHENSIVE METABOLIC PANEL (CC13)
ALT: 18 U/L (ref 0–55)
AST: 19 U/L (ref 5–34)
Albumin: 4.2 g/dL (ref 3.5–5.0)
Anion Gap: 10 mEq/L (ref 3–11)
CO2: 27 mEq/L (ref 22–29)
Calcium: 10 mg/dL (ref 8.4–10.4)
Chloride: 106 mEq/L (ref 98–109)
Creatinine: 1 mg/dL (ref 0.7–1.3)
Potassium: 4.3 mEq/L (ref 3.5–5.1)

## 2013-05-27 NOTE — Telephone Encounter (Signed)
Notified that pt needs to see PCP to have medicine adjusted for his thyroid. Verbalized understanding

## 2013-05-27 NOTE — Progress Notes (Signed)
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 s/p XRT to larynx which finished on 09/04/08.   CURRENT THERAPY: watchful observation.  INTERVAL HISTORY: Andrew Jennings 52 y.o. male returns for further followup. The patient was in a hurry to catch a bus. He denies any hoarseness. Denies any palpable lumps in his neck. No change in his voice. He denies any recent fever, chills, night sweats or abnormal weight loss  I have reviewed the past medical history, past surgical history, social history and family history with the patient and they are unchanged from previous note.  ALLERGIES:  is allergic to darvocet and penicillins.  MEDICATIONS:  Current Outpatient Prescriptions  Medication Sig Dispense Refill  . Choline Fenofibrate (FENOFIBRIC ACID) 135 MG CPDR TAKE ONE CAPSULE BY MOUTH AT BEDTIME  90 capsule  3  . CRESTOR 5 MG tablet TAKE 1 TABLET (5 MG TOTAL) BY MOUTH DAILY.  90 tablet  1  . diphenhydrAMINE (BENADRYL) 50 MG capsule Take 50 mg by mouth every 6 (six) hours as needed.        . haloperidol (HALDOL) 0.5 MG tablet 10 mg. Take 3.5 tablets daily; Total Dosage 35mg .      . levothyroxine (SYNTHROID, LEVOTHROID) 125 MCG tablet TAKE 1 TABLET (125 MCG TOTAL) BY MOUTH DAILY.  90 tablet  1  . levothyroxine (SYNTHROID, LEVOTHROID) 25 MCG tablet Take 35 mcg by mouth daily. States takes one 30 mcg and one 5 mcg to equal 35 mcg total daily      . sildenafil (VIAGRA) 100 MG tablet Take 100 mg by mouth as needed.       No current facility-administered medications for this  visit.    REVIEW OF SYSTEMS:   Constitutional: Denies fevers, chills or abnormal weight loss Eyes: Denies blurriness of vision Ears, nose, mouth, throat, and face: Denies mucositis or sore throat Respiratory: Denies cough, dyspnea or wheezes Cardiovascular: Denies palpitation, chest discomfort or lower extremity swelling Gastrointestinal:  Denies nausea, heartburn or change in bowel habits Skin: Denies abnormal skin rashes Lymphatics: Denies new lymphadenopathy or easy bruising Neurological:Denies numbness, tingling or new weaknesses Behavioral/Psych: Mood is stable, no new changes  All other systems were reviewed with the patient and are negative.  PHYSICAL EXAMINATION: ECOG PERFORMANCE STATUS: 0 - Asymptomatic  Filed Vitals:   05/27/13 1323  BP: 128/88  Pulse: 75  Temp: 96.6 F (35.9 C)  Resp: 18   Filed Weights   05/27/13 1323  Weight: 176 lb 14.4 oz (80.241 kg)    GENERAL:alert, no distress and comfortable SKIN: skin color, texture, turgor are normal, no rashes or significant lesions EYES: normal, Conjunctiva are pink and non-injected, sclera clear OROPHARYNX:no exudate, no erythema and lips, buccal mucosa, and tongue normal  NECK: supple, thyroid normal size, non-tender, without nodularity LYMPH:  no palpable lymphadenopathy in the cervical, axillary or inguinal LUNGS: clear to auscultation and percussion with normal breathing effort HEART: regular rate & rhythm and no murmurs and no lower extremity edema ABDOMEN:abdomen soft, non-tender and normal bowel sounds Musculoskeletal:no cyanosis of digits and no clubbing  NEURO: alert & oriented x 3 with fluent speech, no focal  motor/sensory deficits  LABORATORY DATA:  I have reviewed the data as listed    Component Value Date/Time   NA 143 05/27/2013 1222   NA 143 11/21/2011 1149   K 4.3 05/27/2013 1222   K 4.5 11/21/2011 1149   CL 109* 11/24/2012 1351   CL 107 11/21/2011 1149   CO2 27 05/27/2013 1222   CO2 29  11/21/2011 1149   GLUCOSE 91 05/27/2013 1222   GLUCOSE 95 11/24/2012 1351   GLUCOSE 75 11/21/2011 1149   BUN 15.9 05/27/2013 1222   BUN 15 11/21/2011 1149   CREATININE 1.0 05/27/2013 1222   CREATININE 1.25 11/21/2011 1149   CALCIUM 10.0 05/27/2013 1222   CALCIUM 10.1 11/21/2011 1149   PROT 7.4 05/27/2013 1222   PROT 6.7 11/21/2011 1149   ALBUMIN 4.2 05/27/2013 1222   ALBUMIN 4.4 11/21/2011 1149   AST 19 05/27/2013 1222   AST 18 11/21/2011 1149   ALT 18 05/27/2013 1222   ALT 12 11/21/2011 1149   ALKPHOS 59 05/27/2013 1222   ALKPHOS 39 11/21/2011 1149   BILITOT 0.82 05/27/2013 1222   BILITOT 0.5 11/21/2011 1149    No results found for this basename: SPEP,  UPEP,   kappa and lambda light chains    Lab Results  Component Value Date   WBC 6.4 05/27/2013   NEUTROABS 4.3 05/27/2013   HGB 16.1 05/27/2013   HCT 48.4 05/27/2013   MCV 88.0 05/27/2013   PLT 237 05/27/2013      Chemistry      Component Value Date/Time   NA 143 05/27/2013 1222   NA 143 11/21/2011 1149   K 4.3 05/27/2013 1222   K 4.5 11/21/2011 1149   CL 109* 11/24/2012 1351   CL 107 11/21/2011 1149   CO2 27 05/27/2013 1222   CO2 29 11/21/2011 1149   BUN 15.9 05/27/2013 1222   BUN 15 11/21/2011 1149   CREATININE 1.0 05/27/2013 1222   CREATININE 1.25 11/21/2011 1149      Component Value Date/Time   CALCIUM 10.0 05/27/2013 1222   CALCIUM 10.1 11/21/2011 1149   ALKPHOS 59 05/27/2013 1222   ALKPHOS 39 11/21/2011 1149   AST 19 05/27/2013 1222   AST 18 11/21/2011 1149   ALT 18 05/27/2013 1222   ALT 12 11/21/2011 1149   BILITOT 0.82 05/27/2013 1222   BILITOT 0.5 11/21/2011 1149    ASSESSMENT & PLAN:  #1 T1a N0 M0 laryngeal cancer The patient is cured. He never received any form of chemotherapy. He continue followup with ENT with laryngoscope be periodically. He is 5 years out from treatment. I have not make a return appointment for the patient to come back. #2 hypothyroidism At the time of dictation TSH came back elevated. I  will copy of a note to his primary care provider to titrate his thyroid medicine.  No orders of the defined types were placed in this encounter.   All questions were answered. The patient knows to call the clinic with any problems, questions or concerns. No barriers to learning was detected. I spent 15 minutes counseling the patient face to face. The total time spent in the appointment was 20 minutes and more than 50% was on counseling and review of test results     [facility], Afsana Liera, MD 05/27/2013 1:41 PM

## 2013-06-02 ENCOUNTER — Telehealth: Payer: Self-pay | Admitting: Internal Medicine

## 2013-06-02 NOTE — Telephone Encounter (Signed)
Spoke to pt told him his Oncology provider just checked Thyroid 11/14. Pt verbalized understanding.

## 2013-06-02 NOTE — Telephone Encounter (Signed)
Pt requesting order from PCP for TSH to be checked again.

## 2013-07-15 ENCOUNTER — Other Ambulatory Visit: Payer: Self-pay | Admitting: Internal Medicine

## 2013-08-04 ENCOUNTER — Other Ambulatory Visit: Payer: Self-pay | Admitting: Internal Medicine

## 2013-09-06 ENCOUNTER — Other Ambulatory Visit: Payer: Self-pay | Admitting: Internal Medicine

## 2013-10-26 ENCOUNTER — Other Ambulatory Visit: Payer: Self-pay | Admitting: Internal Medicine

## 2013-11-29 ENCOUNTER — Encounter: Payer: Self-pay | Admitting: Internal Medicine

## 2013-11-29 ENCOUNTER — Ambulatory Visit (INDEPENDENT_AMBULATORY_CARE_PROVIDER_SITE_OTHER): Payer: Commercial Managed Care - HMO | Admitting: Internal Medicine

## 2013-11-29 VITALS — BP 110/70 | HR 89 | Temp 98.1°F | Resp 20 | Ht 67.75 in | Wt 179.0 lb

## 2013-11-29 DIAGNOSIS — I714 Abdominal aortic aneurysm, without rupture, unspecified: Secondary | ICD-10-CM

## 2013-11-29 DIAGNOSIS — C329 Malignant neoplasm of larynx, unspecified: Secondary | ICD-10-CM

## 2013-11-29 DIAGNOSIS — E039 Hypothyroidism, unspecified: Secondary | ICD-10-CM

## 2013-11-29 DIAGNOSIS — F319 Bipolar disorder, unspecified: Secondary | ICD-10-CM

## 2013-11-29 DIAGNOSIS — Z0000 Encounter for general adult medical examination without abnormal findings: Secondary | ICD-10-CM

## 2013-11-29 DIAGNOSIS — E785 Hyperlipidemia, unspecified: Secondary | ICD-10-CM

## 2013-11-29 DIAGNOSIS — F209 Schizophrenia, unspecified: Secondary | ICD-10-CM

## 2013-11-29 LAB — TSH: TSH: 1.21 u[IU]/mL (ref 0.35–4.50)

## 2013-11-29 MED ORDER — ROSUVASTATIN CALCIUM 5 MG PO TABS: ORAL_TABLET | ORAL | Status: DC

## 2013-11-29 NOTE — Progress Notes (Signed)
Pre-visit discussion using our clinic review tool. No additional management support is needed unless otherwise documented below in the visit note.  

## 2013-11-29 NOTE — Patient Instructions (Signed)
It is important that you exercise regularly, at least 20 minutes 3 to 4 times per week.  If you develop chest pain or shortness of breath seek  medical attention.  Ultrasound of the abdominal aortic as discussed  Followup GI oncology and psychiatry  Return in 6 months for follow-up

## 2013-11-29 NOTE — Progress Notes (Signed)
Subjective:    Patient ID: Andrew Jennings, male    DOB: Nov 01, 1960, 53 y.o.   MRN: [id number]  HPI 41 -year-old patient who is seen today for a preventive health examination. He has a history of laryngeal cancer and has recently been seen by oncology. Screening laboratory data reviewed. He has dyslipidemia hypothyroidism and a history of schizophrenia and bipolar disorder. He has OSA and a history of ulcerative colitis. In general doing well today. He states that he has missed 11 doses of thyroid medication.  Over the past 3 months.  He no longer is on CPAP.  He states that his insurance no longer pays for this.  He does have a history of AAA, but unable to find documentation or followup  Past Medical History  Diagnosis Date  . Arthritis   . Depression   . Headache(784.0)   . Glaucoma   . Allergy   . Arrhythmia   . Hyperlipidemia   . Hypothyroid   . Schizophrenia   . Laryngeal cancer     head & neck ca  . Sleep apnea   . Hemorrhoids   . Intestinal polyps   . Terminal ileitis   . Adenoma of large intestine   . AAA (abdominal aortic aneurysm)     History   Social History  . Marital Status: Married    Spouse Name: N/A    Number of Children: N/A  . Years of Education: N/A   Occupational History  . Not on file.   Social History Main Topics  . Smoking status: Former Smoker    Types: Cigarettes    Quit date: 05/02/2009  . Smokeless tobacco: Never Used  . Alcohol Use: Yes     Comment: occasional  . Drug Use: No  . Sexual Activity: Not on file   Other Topics Concern  . Not on file   Social History Narrative  . No narrative on file    Past Surgical History  Procedure Laterality Date  . Neck surgery      BLOOD CLOT IN NECK  . Umbilical hernia repair    . Colonoscopy      Family History  Problem Relation Age of Onset  . Arthritis Mother   . Hyperlipidemia Mother   . Heart disease Mother   . Arthritis Father   . Colon cancer Neg Hx     Allergies   Allergen Reactions  . Darvocet [Propoxyphene N-Acetaminophen] Other (See Comments)  . Penicillins     Current Outpatient Prescriptions on File Prior to Visit  Medication Sig Dispense Refill  . Choline Fenofibrate (FENOFIBRIC ACID) 135 MG CPDR TAKE ONE CAPSULE BY MOUTH AT BEDTIME  90 capsule  3  . diphenhydrAMINE (BENADRYL) 50 MG capsule Take 50 mg by mouth every 6 (six) hours as needed.        . haloperidol (HALDOL) 0.5 MG tablet 10 mg. Take 3.5 tablets daily; Total Dosage 35mg .      . levothyroxine (SYNTHROID, LEVOTHROID) 125 MCG tablet TAKE 1 TABLET (125 MCG TOTAL) BY MOUTH DAILY.  90 tablet  1  . VIAGRA 100 MG tablet TAKE 1 TABLET (100 MG TOTAL) BY MOUTH AS NEEDED FOR ERECTILE DYSFUNCTION.  1 tablet  3   No current facility-administered medications on file prior to visit.    BP 110/70  Pulse 89  Temp(Src) 98.1 F (36.7 C) (Oral)  Resp 20  Ht 5' 7.75" (1.721 m)  Wt 179 lb (81.194 kg)  BMI 27.41 kg/m2  SpO2 98%  1. Risk factors, based on past  M,S,F history .  Cardiovascular risk factors include dyslipidemia  2.  Physical activities: No restrictions  3.  Depression/mood: History of schizophrenia and bipolar depression  4.  Hearing: No deficits  5.  ADL's: Independent in all aspects of daily living  6.  Fall risk: No  7.  Home safety: No problems identified  8.  Height weight, and visual acuity; height weight, stable.  No change in visual acuity  9.  Counseling: Compliance with medication.  Encouraged 10. Lab orders based on risk factors: Followup aortic ultrasound  11. Referral : Follow GI oncology and psychiatry  12. Care plan: Continue present medical regimen  13. Cognitive assessment: Alert and oriented with normal affect      Review of Systems  Constitutional: Negative for fever, chills, appetite change and fatigue.  HENT: Negative for congestion, dental problem, ear pain, hearing loss, sore throat, tinnitus, trouble swallowing and voice change.    Eyes: Negative for pain, discharge and visual disturbance.  Respiratory: Negative for cough, chest tightness, wheezing and stridor.   Cardiovascular: Negative for chest pain, palpitations and leg swelling.  Gastrointestinal: Positive for abdominal pain. Negative for nausea, vomiting, diarrhea, constipation, blood in stool and abdominal distention.  Genitourinary: Negative for urgency, hematuria, flank pain, discharge, difficulty urinating and genital sores.  Musculoskeletal: Negative for arthralgias, back pain, gait problem, joint swelling, myalgias and neck stiffness.  Skin: Negative for rash.  Neurological: Negative for dizziness, syncope, speech difficulty, weakness, numbness and headaches.  Hematological: Negative for adenopathy. Does not bruise/bleed easily.  Psychiatric/Behavioral: Negative for behavioral problems and dysphoric mood. The patient is not nervous/anxious.        Objective:   Physical Exam  Constitutional: He is oriented to person, place, and time. He appears well-developed and well-nourished.  Blood pressure low normal  HENT:  Head: Normocephalic and atraumatic.  Right Ear: External ear normal.  Left Ear: External ear normal.  Nose: Nose normal.  Mouth/Throat: Oropharynx is clear and moist.  Eyes: Conjunctivae and EOM are normal. Pupils are equal, round, and reactive to light. No scleral icterus.  Neck: Normal range of motion. Neck supple. No JVD present. No thyromegaly present.  Cardiovascular: Normal rate, regular rhythm, normal heart sounds and intact distal pulses.  Exam reveals no gallop and no friction rub.   No murmur heard. Pulmonary/Chest: Effort normal and breath sounds normal. He exhibits no tenderness.  Abdominal: Soft. Bowel sounds are normal. He exhibits no distension and no mass. There is no tenderness.  Genitourinary: Prostate normal and penis normal. Guaiac negative stool.  Uncircumcised  Musculoskeletal: Normal range of motion. He exhibits no  edema and no tenderness.  Lymphadenopathy:    He has no cervical adenopathy.  Neurological: He is alert and oriented to person, place, and time. He has normal reflexes. No cranial nerve deficit. Coordination normal.  Parkinsonian tremor, right hand  Skin: Skin is warm and dry. No rash noted.  Psychiatric: He has a normal mood and affect. His behavior is normal.          Assessment & Plan:   Preventive health examination Dyslipidemia. We'll continue Crestor. In view of his history of elevated CPK we'll recommend discontinuation of fenofibrate Hypothyroidism  Recheck 1 year Followup oncology GI and psychiatry Followup abdominal ultrasound

## 2013-12-01 ENCOUNTER — Telehealth: Payer: Self-pay | Admitting: Internal Medicine

## 2013-12-01 NOTE — Telephone Encounter (Signed)
Patient is aware of lab results.

## 2013-12-01 NOTE — Telephone Encounter (Signed)
Pt would like blood work results. Please try hm # first then cell if no ans

## 2013-12-01 NOTE — Telephone Encounter (Signed)
Please call/notify patient that lab/test/procedure is normal 

## 2014-01-20 ENCOUNTER — Ambulatory Visit (INDEPENDENT_AMBULATORY_CARE_PROVIDER_SITE_OTHER): Payer: Commercial Managed Care - HMO | Admitting: Cardiovascular Disease

## 2014-01-20 ENCOUNTER — Encounter: Payer: Self-pay | Admitting: Cardiovascular Disease

## 2014-01-20 VITALS — BP 108/84 | HR 62 | Ht 67.0 in | Wt 176.2 lb

## 2014-01-20 DIAGNOSIS — E785 Hyperlipidemia, unspecified: Secondary | ICD-10-CM

## 2014-01-20 DIAGNOSIS — I712 Thoracic aortic aneurysm, without rupture, unspecified: Secondary | ICD-10-CM

## 2014-01-20 HISTORY — DX: Thoracic aortic aneurysm, without rupture, unspecified: I71.20

## 2014-01-20 HISTORY — DX: Thoracic aortic aneurysm, without rupture: I71.2

## 2014-01-20 NOTE — Progress Notes (Signed)
Andrew Jennings Date of Birth  11-21-1960       [facility]    [employer] [address], [place], [place]  [address]   [place], [place]  [address] [address]   Fax  [phone]     Fax [phone]  Problem List: 1. Thoracic aortic aneurism 2. Hyperlipidemia 3. Laryngeal cancer ~2010, XRT 4. Bipolar disorder    History of Present Illness:  Andrew Jennings is a 53 year old gentleman who presents today for followup of his thoracic aortic aneurysm. He was originally told in [place] that he had a thoracic aneurysm. He was previously followed by Dr. Rollene Fare.    Occasional CP - seems musculoskelatal.  Walks 20-30 minutes a day.   Used to smoke - stopped when he developed larangyeal cancer    Current Outpatient Prescriptions on File Prior to Visit  Medication Sig Dispense Refill  . diphenhydrAMINE (BENADRYL) 50 MG capsule Take 50 mg by mouth every 6 (six) hours as needed.        . haloperidol (HALDOL) 0.5 MG tablet 10 mg. Take 3.5 tablets daily; Total Dosage 35mg .      . levothyroxine (SYNTHROID, LEVOTHROID) 125 MCG tablet TAKE 1 TABLET (125 MCG TOTAL) BY MOUTH DAILY.  90 tablet  1  . rosuvastatin (CRESTOR) 5 MG tablet TAKE 1 TABLET (5 MG TOTAL) BY MOUTH DAILY.  90 tablet  3  . VIAGRA 100 MG tablet TAKE 1 TABLET (100 MG TOTAL) BY MOUTH AS NEEDED FOR ERECTILE DYSFUNCTION.  1 tablet  3   No current facility-administered medications on file prior to visit.    Allergies  Allergen Reactions  . Darvocet [Propoxyphene N-Acetaminophen] Other (See Comments)  . Penicillins     Past Medical History  Diagnosis Date  . Arthritis   . Depression   . Headache(784.0)   . Glaucoma   . Allergy   . Arrhythmia   . Hyperlipidemia   . Hypothyroid   . Schizophrenia   . Laryngeal cancer     head & neck ca  . Sleep apnea   . Hemorrhoids   . Intestinal polyps   . Terminal ileitis   . Adenoma of large intestine   . AAA  (abdominal aortic aneurysm)     Past Surgical History  Procedure Laterality Date  . Neck surgery      BLOOD CLOT IN NECK  . Umbilical hernia repair    . Colonoscopy      History  Smoking status  . Former Smoker  . Types: Cigarettes  . Quit date: 05/02/2009  Smokeless tobacco  . Never Used    History  Alcohol Use  . Yes    Comment: occasional    Family History  Problem Relation Age of Onset  . Arthritis Mother   . Hyperlipidemia Mother   . Heart disease Mother   . Arthritis Father   . Colon cancer Neg Hx     Reviw of Systems:  Reviewed in the HPI.  All other systems are negative.  Physical Exam: Blood pressure 108/84, pulse 62, height 5\' 7"  (1.702 m), weight 176 lb 3.2 oz (79.924 kg). Wt Readings from Last 3 Encounters:  01/20/14 176 lb 3.2 oz (79.924 kg)  11/29/13 179 lb (81.194 kg)  05/27/13 176 lb 14.4 oz (80.241 kg)     General: Well developed, well nourished, in no acute distress.  Head: Normocephalic, atraumatic, sclera non-icteric, mucus membranes are  moist,   Neck: Supple. Carotids are 2 + without bruits. No JVD   Lungs: Clear   Heart: RR, normal S1S2  Abdomen: Soft, non-tender, non-distended with normal bowel sounds.  Msk:  Strength and tone are normal   Extremities: No clubbing or cyanosis. No edema.  Distal pedal pulses are 2+ and equal    Neuro: CN II - XII intact.  Alert and oriented X 3.   Psych:  Normal  ECG: January 20, 2014:   NSR at 69.   Assessment / Plan:

## 2014-01-20 NOTE — Assessment & Plan Note (Signed)
Marlene presents for further management of a small arrest aortic aneurysm that was diagnosed when he lived in [place] several years ago. He's had a CT scan at home health which revealed mild dilatation of his thoracic aorta. He's completely asymptomatic. His blood pressures well controlled.  We'll schedule him for an MR angiogram of his chest to further evaluate the thoracic aortic aneurysm.  I will see him back in 1 year.

## 2014-01-20 NOTE — Patient Instructions (Signed)
Your physician has requested that you have a MRA. Cardiac MRA uses a computer to create images of your heart as its beating, producing both still and moving pictures of your heart and major blood vessels. For further information please visit [email or web address] Please follow the instruction sheet given to you today for more information.  Your physician recommends that you continue on your current medications as directed. Please refer to the Current Medication list given to you today.  Your physician wants you to follow-up in: 1 year with Dr. Acie Fredrickson.  You will receive a reminder letter in the mail two months in advance. If you don't receive a letter, please call our office to schedule the follow-up appointment.

## 2014-01-31 ENCOUNTER — Ambulatory Visit (HOSPITAL_COMMUNITY): Admission: RE | Admit: 2014-01-31 | Payer: Medicare HMO | Source: Ambulatory Visit

## 2014-02-09 ENCOUNTER — Ambulatory Visit (HOSPITAL_COMMUNITY)
Admission: RE | Admit: 2014-02-09 | Discharge: 2014-02-09 | Disposition: A | Discharge status: Home / Self Care | Payer: Medicare HMO | Source: Ambulatory Visit | Attending: Cardiovascular Disease | Admitting: Cardiovascular Disease

## 2014-02-09 DIAGNOSIS — I7090 Unspecified atherosclerosis: Secondary | ICD-10-CM | POA: Diagnosis not present

## 2014-02-09 DIAGNOSIS — I712 Thoracic aortic aneurysm, without rupture, unspecified: Secondary | ICD-10-CM | POA: Insufficient documentation

## 2014-02-09 MED ORDER — GADOBENATE DIMEGLUMINE 529 MG/ML IV SOLN
20.0000 mL | Freq: Once | INTRAVENOUS | Status: AC | PRN
  Administered 2014-02-09: 20 mL via INTRAVENOUS

## 2014-02-14 ENCOUNTER — Telehealth: Payer: Self-pay | Admitting: Cardiovascular Disease

## 2014-02-14 NOTE — Telephone Encounter (Signed)
New Message  Pt calling for MRI results please call//SR

## 2014-02-14 NOTE — Telephone Encounter (Signed)
Left pt a message to call back. 

## 2014-02-15 NOTE — Telephone Encounter (Signed)
Attempted to call patient - phone rings, no answer; unable to leave message

## 2014-02-15 NOTE — Telephone Encounter (Signed)
Dr. Marlou Porch, DOD, reviewed MR report and advised there is some change in size but stable and Dr. Acie Fredrickson should review and advise patient regarding follow-up.  Dr. Acie Fredrickson is on vacation this week.  Patient aware of these results and that I will call him back next week when Dr. Acie Fredrickson has reviewed.  Patient verbalized understanding and agreement.

## 2014-08-03 ENCOUNTER — Telehealth: Payer: Self-pay | Admitting: Cardiovascular Disease

## 2014-08-03 NOTE — Telephone Encounter (Signed)
Spoke with patient who has a question about his bill.  I advised him to call back on Monday as it is after 5 pm and the office is closed tomorrow due to inclement weather.  Patient verbalized understanding and agreement.

## 2014-08-03 NOTE — Telephone Encounter (Signed)
Left message for patient to call back  

## 2014-08-03 NOTE — Telephone Encounter (Signed)
F/U ° ° ° ° ° ° ° ° ° °Pt returning call. Please call back.  °

## 2014-08-03 NOTE — Telephone Encounter (Signed)
New Message  Pt requested to speak w/ Andrew Jennings. Please call back and discuss.

## 2014-10-14 ENCOUNTER — Other Ambulatory Visit: Payer: Self-pay | Admitting: Internal Medicine

## 2014-10-17 DIAGNOSIS — D3131 Benign neoplasm of right choroid: Secondary | ICD-10-CM | POA: Diagnosis not present

## 2014-10-17 DIAGNOSIS — H524 Presbyopia: Secondary | ICD-10-CM | POA: Diagnosis not present

## 2014-10-17 DIAGNOSIS — H5210 Myopia, unspecified eye: Secondary | ICD-10-CM | POA: Diagnosis not present

## 2014-12-01 ENCOUNTER — Encounter: Payer: Self-pay | Admitting: Internal Medicine

## 2014-12-01 ENCOUNTER — Ambulatory Visit (INDEPENDENT_AMBULATORY_CARE_PROVIDER_SITE_OTHER): Payer: Commercial Managed Care - HMO | Admitting: Internal Medicine

## 2014-12-01 VITALS — BP 100/80 | HR 84 | Temp 97.9°F | Ht 67.0 in | Wt 175.0 lb

## 2014-12-01 DIAGNOSIS — C329 Malignant neoplasm of larynx, unspecified: Secondary | ICD-10-CM | POA: Diagnosis not present

## 2014-12-01 DIAGNOSIS — E785 Hyperlipidemia, unspecified: Secondary | ICD-10-CM

## 2014-12-01 DIAGNOSIS — I712 Thoracic aortic aneurysm, without rupture, unspecified: Secondary | ICD-10-CM

## 2014-12-01 DIAGNOSIS — Z0000 Encounter for general adult medical examination without abnormal findings: Secondary | ICD-10-CM

## 2014-12-01 DIAGNOSIS — E039 Hypothyroidism, unspecified: Secondary | ICD-10-CM | POA: Diagnosis not present

## 2014-12-01 LAB — CBC WITH DIFFERENTIAL/PLATELET
Basophils Absolute: 0 10*3/uL (ref 0.0–0.1)
Basophils Relative: 0.5 % (ref 0.0–3.0)
EOS ABS: 0.1 10*3/uL (ref 0.0–0.7)
Eosinophils Relative: 1 % (ref 0.0–5.0)
HCT: 48.5 % (ref 39.0–52.0)
HEMOGLOBIN: 16.6 g/dL (ref 13.0–17.0)
LYMPHS ABS: 1.6 10*3/uL (ref 0.7–4.0)
LYMPHS PCT: 23.4 % (ref 12.0–46.0)
MCHC: 34.3 g/dL (ref 30.0–36.0)
MCV: 86.7 fl (ref 78.0–100.0)
Monocytes Absolute: 0.5 10*3/uL (ref 0.1–1.0)
Monocytes Relative: 7 % (ref 3.0–12.0)
NEUTROS ABS: 4.7 10*3/uL (ref 1.4–7.7)
Neutrophils Relative %: 68.1 % (ref 43.0–77.0)
Platelets: 256 10*3/uL (ref 150.0–400.0)
RBC: 5.59 Mil/uL (ref 4.22–5.81)
RDW: 13.3 % (ref 11.5–15.5)
WBC: 6.9 10*3/uL (ref 4.0–10.5)

## 2014-12-01 LAB — LIPID PANEL
CHOLESTEROL: 201 mg/dL — AB (ref 0–200)
HDL: 43.8 mg/dL (ref >39.00)
LDL Cholesterol: 133 mg/dL — ABNORMAL HIGH (ref 0–99)
NonHDL: 157.2
TRIGLYCERIDES: 119 mg/dL (ref 0.0–149.0)
Total CHOL/HDL Ratio: 5
VLDL: 23.8 mg/dL (ref 0.0–40.0)

## 2014-12-01 LAB — CK: Total CK: 97 U/L (ref 7–232)

## 2014-12-01 LAB — TSH: TSH: 11.62 u[IU]/mL — ABNORMAL HIGH (ref 0.35–4.50)

## 2014-12-01 LAB — COMPREHENSIVE METABOLIC PANEL
ALK PHOS: 48 U/L (ref 39–117)
ALT: 16 U/L (ref 0–53)
AST: 16 U/L (ref 0–37)
Albumin: 4.4 g/dL (ref 3.5–5.2)
BUN: 14 mg/dL (ref 6–23)
CO2: 31 mEq/L (ref 19–32)
CREATININE: 1.07 mg/dL (ref 0.40–1.50)
Calcium: 9.7 mg/dL (ref 8.4–10.5)
Chloride: 102 mEq/L (ref 96–112)
GFR: 75.94 mL/min (ref >60.00)
Glucose, Bld: 92 mg/dL (ref 70–99)
Potassium: 4.1 mEq/L (ref 3.5–5.1)
Sodium: 138 mEq/L (ref 135–145)
Total Bilirubin: 1.3 mg/dL — ABNORMAL HIGH (ref 0.2–1.2)
Total Protein: 7.3 g/dL (ref 6.0–8.3)

## 2014-12-01 NOTE — Progress Notes (Signed)
Pre visit review using our clinic review tool, if applicable. No additional management support is needed unless otherwise documented below in the visit note. 

## 2014-12-01 NOTE — Progress Notes (Signed)
Subjective:    Patient ID: Andrew Jennings, male    DOB: May 30, 1961, 54 y.o.   MRN: [id number]  HPI  Subjective:    Patient ID: Andrew Jennings, male    DOB: 26-Sep-1960, 54 y.o.   MRN: [id number]  HPI 54  -year-old patient who is seen today for a preventive health examination.  He has a history of laryngeal cancer and has recently been seen by oncology.  No recent laboratory data. He has dyslipidemia hypothyroidism and a history of schizophrenia and bipolar disorder. He has OSA and a history of ulcerative colitis. In general doing well today. He states that he has missed 18 doses of thyroid medication over the past 3 months.  He is followed by cardiology due to a thoracic aortic aneurysm   Past Medical History  Diagnosis Date  . Arthritis   . Depression   . Headache(784.0)   . Glaucoma   . Allergy   . Arrhythmia   . Hyperlipidemia   . Hypothyroid   . Schizophrenia   . Laryngeal cancer     head & neck ca  . Sleep apnea   . Hemorrhoids   . Intestinal polyps   . Terminal ileitis   . Adenoma of large intestine   . AAA (abdominal aortic aneurysm)     History   Social History  . Marital Status: Married    Spouse Name: N/A  . Number of Children: N/A  . Years of Education: N/A   Occupational History  . Not on file.   Social History Main Topics  . Smoking status: Former Smoker    Types: Cigarettes    Quit date: 05/02/2009  . Smokeless tobacco: Never Used  . Alcohol Use: Yes     Comment: occasional  . Drug Use: No  . Sexual Activity: Not on file   Other Topics Concern  . Not on file   Social History Narrative    Past Surgical History  Procedure Laterality Date  . Neck surgery      BLOOD CLOT IN NECK  . Umbilical hernia repair    . Colonoscopy      Family History  Problem Relation Age of Onset  . Arthritis Mother   . Hyperlipidemia Mother   . Heart disease Mother   . Arthritis Father   . Colon cancer Neg Hx     Allergies  Allergen Reactions  .  Darvocet [Propoxyphene N-Acetaminophen] Other (See Comments)  . Penicillins     Current Outpatient Prescriptions on File Prior to Visit  Medication Sig Dispense Refill  . diphenhydrAMINE (BENADRYL) 50 MG capsule Take 50 mg by mouth every 6 (six) hours as needed.      . haloperidol (HALDOL) 0.5 MG tablet 10 mg. Take 3.5 tablets daily; Total Dosage 35mg .    . levothyroxine (SYNTHROID, LEVOTHROID) 125 MCG tablet TAKE 1 TABLET (125 MCG TOTAL) BY MOUTH DAILY. 90 tablet 1  . levothyroxine (SYNTHROID, LEVOTHROID) 125 MCG tablet TAKE 1 TABLET (125 MCG TOTAL) BY MOUTH DAILY. 90 tablet 1  . rosuvastatin (CRESTOR) 5 MG tablet TAKE 1 TABLET (5 MG TOTAL) BY MOUTH DAILY. 90 tablet 3  . VIAGRA 100 MG tablet TAKE 1 TABLET (100 MG TOTAL) BY MOUTH AS NEEDED FOR ERECTILE DYSFUNCTION. 1 tablet 3   No current facility-administered medications on file prior to visit.    BP 100/80 mmHg  Pulse 84  Temp(Src) 97.9 F (36.6 C) (Oral)  Ht 5\' 7"  (1.702 m)  Wt 175 lb (79.379 kg)  BMI 27.40 kg/m2   1. Risk factors, based on past  M,S,F history .  Cardiovascular risk factors include dyslipidemia And vascular disease  2.  Physical activities: No restrictions  3.  Depression/mood: History of schizophrenia and bipolar depression  4.  Hearing: No deficits  5.  ADL's: Independent in all aspects of daily living  6.  Fall risk: No  7.  Home safety: No problems identified  8.  Height weight, and visual acuity; height weight, stable.  No change in visual acuity  9.  Counseling: Compliance with medication.  Encouraged 10. Lab orders based on risk factors: Followup aortic ultrasound  11. Referral : Follow GI oncology and psychiatry  12. Care plan: Continue present medical regimen  13. Cognitive assessment: Alert and oriented with normal affect  14.  Preventive services will include an annual examination with screening lab.  His thoracic aortic aneurysm.  Will be monitored at least annually.  He will continue  on 10 year interval colonoscopies.  15.  Provider list includes cardiology, oncology, ENT, ophthalmology and GI      Review of Systems  Constitutional: Negative for fever, chills, appetite change and fatigue.  HENT: Negative for congestion, dental problem, ear pain, hearing loss, sore throat, tinnitus, trouble swallowing and voice change.   Eyes: Negative for pain, discharge and visual disturbance.  Respiratory: Negative for cough, chest tightness, wheezing and stridor.   Cardiovascular: Negative for chest pain, palpitations and leg swelling.  Gastrointestinal: Positive for abdominal pain. Negative for nausea, vomiting, diarrhea, constipation, blood in stool and abdominal distention.  Genitourinary: Negative for urgency, hematuria, flank pain, discharge, difficulty urinating and genital sores.  Musculoskeletal: Negative for arthralgias, back pain, gait problem, joint swelling, myalgias and neck stiffness.  Skin: Negative for rash.  Neurological: Negative for dizziness, syncope, speech difficulty, weakness, numbness and headaches.  Hematological: Negative for adenopathy. Does not bruise/bleed easily.  Psychiatric/Behavioral: Negative for behavioral problems and dysphoric mood. The patient is not nervous/anxious.        Objective:   Physical Exam  Constitutional: He is oriented to person, place, and time. He appears well-developed and well-nourished.  Blood pressure low normal  HENT:  Head: Normocephalic and atraumatic.  Right Ear: External ear normal.  Left Ear: External ear normal.  Nose: Nose normal.  Mouth/Throat: Oropharynx is clear and moist.  Eyes: Conjunctivae and EOM are normal. Pupils are equal, round, and reactive to light. No scleral icterus.  Neck: Normal range of motion. Neck supple. No JVD present. No thyromegaly present.  Cardiovascular: Normal rate, regular rhythm, normal heart sounds and intact distal pulses.  Exam reveals no gallop and no friction rub.   No  murmur heard. Pulmonary/Chest: Effort normal and breath sounds normal. He exhibits no tenderness.  Abdominal: Soft. Bowel sounds are normal. He exhibits no distension and no mass. There is no tenderness.  Genitourinary: Prostate normal and penis normal. Guaiac negative stool.  Uncircumcised  Musculoskeletal: Normal range of motion. He exhibits no edema and no tenderness.  Lymphadenopathy:    He has no cervical adenopathy.  Neurological: He is alert and oriented to person, place, and time. He has normal reflexes. No cranial nerve deficit. Coordination normal.  Parkinsonian tremor, right hand  Skin: Skin is warm and dry. No rash noted.  Psychiatric: He has a normal mood and affect. His behavior is normal.          Assessment & Plan:   Preventive health examination Dyslipidemia. We'll continue Crestor.  Hypothyroidis.  Compliance with his  medications.  Encouraged  Recheck 1 year Followup oncology GI and psychiatry Followup abdominal ultrasound Per cardiology   Review of Systems As above     Objective:   Physical Exam  Skin:  Small surgical scars anterior neck and umbilical area    As above      Assessment & Plan:  Preventive health examination Thoracic aortic aneurysm.  Will continue aggressive risk factor modification with statin therapy.  Blood pressure is well-controlled today in a low-normal range Laryngeal cancer.  Will continue close observation Schizophrenia/bipolar disorder.  Follow-up psychiatry Hypothyroidism.  We'll check a TSH Dyslipidemia.  Continue statin therapy

## 2014-12-01 NOTE — Patient Instructions (Signed)
Limit your sodium (Salt) intake    It is important that you exercise regularly, at least 20 minutes 3 to 4 times per week.  If you develop chest pain or shortness of breath seek  medical attention.  Return in 6 months for follow-up Health Maintenance A healthy lifestyle and preventative care can promote health and wellness.  Maintain regular health, dental, and eye exams.  Eat a healthy diet. Foods like vegetables, fruits, whole grains, low-fat dairy products, and lean protein foods contain the nutrients you need and are low in calories. Decrease your intake of foods high in solid fats, added sugars, and salt. Get information about a proper diet from your health care provider, if necessary.  Regular physical exercise is one of the most important things you can do for your health. Most adults should get at least 150 minutes of moderate-intensity exercise (any activity that increases your heart rate and causes you to sweat) each week. In addition, most adults need muscle-strengthening exercises on 2 or more days a week.   Maintain a healthy weight. The body mass index (BMI) is a screening tool to identify possible weight problems. It provides an estimate of body fat based on height and weight. Your health care provider can find your BMI and can help you achieve or maintain a healthy weight. For males 20 years and older:  A BMI below 18.5 is considered underweight.  A BMI of 18.5 to 24.9 is normal.  A BMI of 25 to 29.9 is considered overweight.  A BMI of 30 and above is considered obese.  Maintain normal blood lipids and cholesterol by exercising and minimizing your intake of saturated fat. Eat a balanced diet with plenty of fruits and vegetables. Blood tests for lipids and cholesterol should begin at age 20 and be repeated every 5 years. If your lipid or cholesterol levels are high, you are over age 50, or you are at high risk for heart disease, you may need your cholesterol levels checked  more frequently.Ongoing high lipid and cholesterol levels should be treated with medicines if diet and exercise are not working.  If you smoke, find out from your health care provider how to quit. If you do not use tobacco, do not start.  Lung cancer screening is recommended for adults aged 55-80 years who are at high risk for developing lung cancer because of a history of smoking. A yearly low-dose CT scan of the lungs is recommended for people who have at least a 30-pack-year history of smoking and are current smokers or have quit within the past 15 years. A pack year of smoking is smoking an average of 1 pack of cigarettes a day for 1 year (for example, a 30-pack-year history of smoking could mean smoking 1 pack a day for 30 years or 2 packs a day for 15 years). Yearly screening should continue until the smoker has stopped smoking for at least 15 years. Yearly screening should be stopped for people who develop a health problem that would prevent them from having lung cancer treatment.  If you choose to drink alcohol, do not have more than 2 drinks per day. One drink is considered to be 12 oz (360 mL) of beer, 5 oz (150 mL) of wine, or 1.5 oz (45 mL) of liquor.  Avoid the use of street drugs. Do not share needles with anyone. Ask for help if you need support or instructions about stopping the use of drugs.  High blood pressure causes heart disease   and increases the risk of stroke. Blood pressure should be checked at least every 1-2 years. Ongoing high blood pressure should be treated with medicines if weight loss and exercise are not effective.  If you are 45-79 years old, ask your health care provider if you should take aspirin to prevent heart disease.  Diabetes screening involves taking a blood sample to check your fasting blood sugar level. This should be done once every 3 years after age 45 if you are at a normal weight and without risk factors for diabetes. Testing should be considered at a  younger age or be carried out more frequently if you are overweight and have at least 1 risk factor for diabetes.  Colorectal cancer can be detected and often prevented. Most routine colorectal cancer screening begins at the age of 50 and continues through age 75. However, your health care provider may recommend screening at an earlier age if you have risk factors for colon cancer. On a yearly basis, your health care provider may provide home test kits to check for hidden blood in the stool. A small camera at the end of a tube may be used to directly examine the colon (sigmoidoscopy or colonoscopy) to detect the earliest forms of colorectal cancer. Talk to your health care provider about this at age 50 when routine screening begins. A direct exam of the colon should be repeated every 5-10 years through age 75, unless early forms of precancerous polyps or small growths are found.  People who are at an increased risk for hepatitis B should be screened for this virus. You are considered at high risk for hepatitis B if:  You were born in a country where hepatitis B occurs often. Talk with your health care provider about which countries are considered high risk.  Your parents were born in a high-risk country and you have not received a shot to protect against hepatitis B (hepatitis B vaccine).  You have HIV or AIDS.  You use needles to inject street drugs.  You live with, or have sex with, someone who has hepatitis B.  You are a man who has sex with other men (MSM).  You get hemodialysis treatment.  You take certain medicines for conditions like cancer, organ transplantation, and autoimmune conditions.  Hepatitis C blood testing is recommended for all people born from 1945 through 1965 and any individual with known risk factors for hepatitis C.  Healthy men should no longer receive prostate-specific antigen (PSA) blood tests as part of routine cancer screening. Talk to your health care provider  about prostate cancer screening.  Testicular cancer screening is not recommended for adolescents or adult males who have no symptoms. Screening includes self-exam, a health care provider exam, and other screening tests. Consult with your health care provider about any symptoms you have or any concerns you have about testicular cancer.  Practice safe sex. Use condoms and avoid high-risk sexual practices to reduce the spread of sexually transmitted infections (STIs).  You should be screened for STIs, including gonorrhea and chlamydia if:  You are sexually active and are younger than 24 years.  You are older than 24 years, and your health care provider tells you that you are at risk for this type of infection.  Your sexual activity has changed since you were last screened, and you are at an increased risk for chlamydia or gonorrhea. Ask your health care provider if you are at risk.  If you are at risk of being infected with   HIV, it is recommended that you take a prescription medicine daily to prevent HIV infection. This is called pre-exposure prophylaxis (PrEP). You are considered at risk if:  You are a man who has sex with other men (MSM).  You are a heterosexual man who is sexually active with multiple partners.  You take drugs by injection.  You are sexually active with a partner who has HIV.  Talk with your health care provider about whether you are at high risk of being infected with HIV. If you choose to begin PrEP, you should first be tested for HIV. You should then be tested every 3 months for as long as you are taking PrEP.  Use sunscreen. Apply sunscreen liberally and repeatedly throughout the day. You should seek shade when your shadow is shorter than you. Protect yourself by wearing long sleeves, pants, a wide-brimmed hat, and sunglasses year round whenever you are outdoors.  Tell your health care provider of new moles or changes in moles, especially if there is a change in shape  or color. Also, tell your health care provider if a mole is larger than the size of a pencil eraser.  A one-time screening for abdominal aortic aneurysm (AAA) and surgical repair of large AAAs by ultrasound is recommended for men aged 65-75 years who are current or former smokers.  Stay current with your vaccines (immunizations). Document Released: 12/27/2007 Document Revised: 07/05/2013 Document Reviewed: 11/25/2010 Exit[employer] Patient Information 2015 [employer]. This information is not intended to replace advice given to you by your health care provider. Make sure you discuss any questions you have with your health care provider.  

## 2014-12-05 ENCOUNTER — Telehealth: Payer: Self-pay | Admitting: Internal Medicine

## 2014-12-05 NOTE — Telephone Encounter (Signed)
Spoke to pt told him that all lab work was normal except for thyroid function studies. Asked pt if taking thyroid medication everyday? Pt said sometimes.Encouraged pt to take thyroid  Medication daily prior to eating breakfast and wait 30-60 minutes to eat. Pt verbalized understanding.

## 2014-12-05 NOTE — Telephone Encounter (Signed)
Pt needs blood work results °

## 2014-12-05 NOTE — Telephone Encounter (Signed)
Notify patient that all lab work was normal except for thyroid function studies.  Encourage compliance with daily thyroid medication

## 2014-12-05 NOTE — Telephone Encounter (Signed)
Pt calling for lab results, please advise. 

## 2015-01-22 ENCOUNTER — Encounter: Payer: Self-pay | Admitting: Cardiovascular Disease

## 2015-01-22 ENCOUNTER — Ambulatory Visit (INDEPENDENT_AMBULATORY_CARE_PROVIDER_SITE_OTHER): Payer: Commercial Managed Care - HMO | Admitting: Cardiovascular Disease

## 2015-01-22 VITALS — BP 104/86 | HR 76 | Ht 67.0 in | Wt 173.4 lb

## 2015-01-22 DIAGNOSIS — I712 Thoracic aortic aneurysm, without rupture, unspecified: Secondary | ICD-10-CM

## 2015-01-22 NOTE — Patient Instructions (Signed)
Medication Instructions:  Your physician recommends that you continue on your current medications as directed. Please refer to the Current Medication list given to you today.   Labwork: None Ordered   Testing/Procedures: None Ordered   Follow-Up: Your physician wants you to follow-up in: 1 year with Dr. Nahser.  You will receive a reminder letter in the mail two months in advance. If you don't receive a letter, please call our office to schedule the follow-up appointment.      

## 2015-01-22 NOTE — Progress Notes (Signed)
Andrew Jennings Date of Birth  April 03, 1961       [facility]    [employer] [address], [place], [place]  [address]   [place], [place]  [address] [place]   Fax  [phone]     Fax [phone]  Problem List: 1. Thoracic aortic aneurism 2. Hyperlipidemia 3. Laryngeal cancer ~2010, XRT 4. Bipolar disorder    History of Present Illness:  Andrew Jennings is a 54 year old gentleman who presents today for followup of his thoracic aortic aneurysm. He was originally told in [place] that he had a thoracic aneurysm. He was previously followed by Dr. Rollene Fare.    Occasional CP - seems musculoskelatal.  Walks 20-30 minutes a day.   Used to smoke - stopped when he developed larangyeal cancer  January 22, 2015:  Andrew Jennings is seen in follow up for his ascending aortic aneurism. The MRI from 2015 reveals:  IMPRESSION: Maximal diameter of the ascending aorta above the valve has increased from 3.8 cm to 4.2 cm. At the sino-tubular junction, it has increased from 3.8 cm to 4.1 cm. It is grossly unchanged at the sinus of Valsalva.  He did not like the MRA because he was not able to hold his breath that long .  He is doing well.    Current Outpatient Prescriptions on File Prior to Visit  Medication Sig Dispense Refill  . aspirin 81 MG tablet Take 81 mg by mouth daily.    . diphenhydrAMINE (BENADRYL) 50 MG capsule Take 50 mg by mouth every 6 (six) hours as needed for itching or allergies.     Marland Kitchen levothyroxine (SYNTHROID, LEVOTHROID) 125 MCG tablet TAKE 1 TABLET (125 MCG TOTAL) BY MOUTH DAILY. 90 tablet 1  . rosuvastatin (CRESTOR) 5 MG tablet TAKE 1 TABLET (5 MG TOTAL) BY MOUTH DAILY. 90 tablet 3  . VIAGRA 100 MG tablet TAKE 1 TABLET (100 MG TOTAL) BY MOUTH AS NEEDED FOR ERECTILE DYSFUNCTION. 1 tablet 3   No current facility-administered medications on file prior to visit.    Allergies  Allergen Reactions  . Darvocet  [Propoxyphene N-Acetaminophen] Other (See Comments)    "Makes me feel strange"..Per pt   . Penicillins     Makes sick, per pt     Past Medical History  Diagnosis Date  . Arthritis   . Depression   . Headache(784.0)   . Glaucoma   . Allergy   . Arrhythmia   . Hyperlipidemia   . Hypothyroid   . Schizophrenia   . Laryngeal cancer     head & neck ca  . Sleep apnea   . Hemorrhoids   . Intestinal polyps   . Terminal ileitis   . Adenoma of large intestine   . AAA (abdominal aortic aneurysm)     Past Surgical History  Procedure Laterality Date  . Neck surgery      BLOOD CLOT IN NECK  . Umbilical hernia repair    . Colonoscopy      History  Smoking status  . Former Smoker  . Types: Cigarettes  . Quit date: 05/02/2009  Smokeless tobacco  . Never Used    History  Alcohol Use  . Yes    Comment: occasional    Family History  Problem Relation Age of Onset  . Arthritis Mother   . Hyperlipidemia Mother   . Heart disease Mother   . Arthritis Father   .  Colon cancer Neg Hx     Reviw of Systems:  Reviewed in the HPI.  All other systems are negative.  Physical Exam: Blood pressure 104/86, pulse 76, height 5\' 7"  (1.702 m), weight 78.654 kg (173 lb 6.4 oz). Wt Readings from Last 3 Encounters:  01/22/15 78.654 kg (173 lb 6.4 oz)  12/01/14 79.379 kg (175 lb)  01/20/14 79.924 kg (176 lb 3.2 oz)     General: Well developed, well nourished, in no acute distress.  Head: Normocephalic, atraumatic, sclera non-icteric, mucus membranes are moist,   Neck: Supple. Carotids are 2 + without bruits. No JVD   Lungs: Clear   Heart: RR, normal S1S2  Abdomen: Soft, non-tender, non-distended with normal bowel sounds.  Msk:  Strength and tone are normal   Extremities: No clubbing or cyanosis. No edema.  Distal pedal pulses are 2+ and equal    Neuro: CN II - XII intact.  Alert and oriented X 3.   Psych:  Normal  ECG: January 22, 2015:  NSR at 41.    Assessment /  Plan:   1. Thoracic aortic aneurism - he did not do well in the MRI scanner and does not want to have any more .  Will do a CT of the chest in 1 year.   2. Hyperlipidemia 3. Laryngeal cancer ~2010, XRT 4. Bipolar disorder     Raiford Fetterman, Wonda Cheng, MD  01/22/2015 3:27 PM    [facility] [place],  [place], [place]  [address] Pager [phone] Phone: [phone]; Fax: [phone]   [facility]  [address] [place], [place]  [address] [phone]    Fax [phone]

## 2015-01-24 DIAGNOSIS — F200 Paranoid schizophrenia: Secondary | ICD-10-CM | POA: Diagnosis not present

## 2015-01-26 ENCOUNTER — Other Ambulatory Visit: Payer: Self-pay | Admitting: Internal Medicine

## 2015-04-05 ENCOUNTER — Other Ambulatory Visit: Payer: Self-pay | Admitting: Internal Medicine

## 2015-04-20 ENCOUNTER — Telehealth: Payer: Self-pay | Admitting: Hematology and Oncology

## 2015-04-20 NOTE — Telephone Encounter (Signed)
Returned patient's call re message left to see if he had an appointment in our office 11/14. Patient last seen 05/27/13 and no f/u was needed. Left message for patient and asked that he call back should he need an appointment.

## 2015-05-24 ENCOUNTER — Ambulatory Visit: Payer: Commercial Managed Care - HMO | Admitting: Internal Medicine

## 2015-06-11 ENCOUNTER — Encounter: Payer: Self-pay | Admitting: Internal Medicine

## 2015-06-11 ENCOUNTER — Ambulatory Visit (INDEPENDENT_AMBULATORY_CARE_PROVIDER_SITE_OTHER): Payer: Commercial Managed Care - HMO | Admitting: Internal Medicine

## 2015-06-11 VITALS — BP 102/68 | HR 81 | Temp 97.8°F | Resp 20 | Ht 67.0 in | Wt 176.0 lb

## 2015-06-11 DIAGNOSIS — C329 Malignant neoplasm of larynx, unspecified: Secondary | ICD-10-CM | POA: Diagnosis not present

## 2015-06-11 DIAGNOSIS — I712 Thoracic aortic aneurysm, without rupture, unspecified: Secondary | ICD-10-CM

## 2015-06-11 DIAGNOSIS — R748 Abnormal levels of other serum enzymes: Secondary | ICD-10-CM | POA: Diagnosis not present

## 2015-06-11 DIAGNOSIS — E039 Hypothyroidism, unspecified: Secondary | ICD-10-CM | POA: Diagnosis not present

## 2015-06-11 NOTE — Progress Notes (Signed)
Subjective:    Patient ID: Andrew Jennings, male    DOB: Jan 16, 1961, 54 y.o.   MRN: [id number]  HPI  54 year old patient who is seen today for his biannual follow-up.  He has a history of dyslipidemia and remains on low intensity Crestor.  He is followed by cardiology due to a thoracic aortic aneurysm.  This has been stable. He has remote history of laryngeal cancer diagnosed approximately 7 years ago He has hypothyroidism.  Last TSH was elevated, but he has been somewhat noncompliant with his medications.  He states that he has omitted taking daily levothyroxin 2 days out of the past 30  He has a history of exercise associated rhabdomyolysis and is requesting a follow-up CPK.  This was normal 6 months ago  Past Medical History  Diagnosis Date  . Arthritis   . Depression   . Headache(784.0)   . Glaucoma   . Allergy   . Arrhythmia   . Hyperlipidemia   . Hypothyroid   . Schizophrenia ([place])   . Laryngeal cancer ([place])     head & neck ca  . Sleep apnea   . Hemorrhoids   . Intestinal polyps   . Terminal ileitis ([place])   . Adenoma of large intestine   . AAA (abdominal aortic aneurysm) [facility])     Social History   Social History  . Marital Status: Married    Spouse Name: N/A  . Number of Children: N/A  . Years of Education: N/A   Occupational History  . Not on file.   Social History Main Topics  . Smoking status: Former Smoker    Types: Cigarettes    Quit date: 05/02/2009  . Smokeless tobacco: Never Used  . Alcohol Use: Yes     Comment: occasional  . Drug Use: No  . Sexual Activity: Not on file   Other Topics Concern  . Not on file   Social History Narrative    Past Surgical History  Procedure Laterality Date  . Neck surgery      BLOOD CLOT IN NECK  . Umbilical hernia repair    . Colonoscopy      Family History  Problem Relation Age of Onset  . Arthritis Mother   . Hyperlipidemia Mother   . Heart disease Mother   . Arthritis Father   . Colon cancer  Neg Hx     Allergies  Allergen Reactions  . Darvocet [Propoxyphene N-Acetaminophen] Other (See Comments)    "Makes me feel strange"..Per pt   . Penicillins     Makes sick, per pt     Current Outpatient Prescriptions on File Prior to Visit  Medication Sig Dispense Refill  . aspirin 81 MG tablet Take 81 mg by mouth daily.    . CRESTOR 5 MG tablet TAKE 1 TABLET (5 MG TOTAL) BY MOUTH DAILY. 90 tablet 3  . diphenhydrAMINE (BENADRYL) 50 MG capsule Take 50 mg by mouth every 6 (six) hours as needed for itching or allergies.     . haloperidol (HALDOL) 10 MG tablet Take 30 mg by mouth daily.    . haloperidol (HALDOL) 5 MG tablet Take 5 mg by mouth daily.    Marland Kitchen levothyroxine (SYNTHROID, LEVOTHROID) 125 MCG tablet TAKE 1 TABLET (125 MCG TOTAL) BY MOUTH DAILY. 90 tablet 1  . levothyroxine (SYNTHROID, LEVOTHROID) 125 MCG tablet TAKE 1 TABLET (125 MCG TOTAL) BY MOUTH DAILY. 30 tablet 5  . rosuvastatin (CRESTOR) 5 MG tablet TAKE 1 TABLET (5 MG TOTAL) BY  MOUTH DAILY. 90 tablet 3  . VIAGRA 100 MG tablet TAKE 1 TABLET (100 MG TOTAL) BY MOUTH AS NEEDED FOR ERECTILE DYSFUNCTION. 1 tablet 3   No current facility-administered medications on file prior to visit.    BP 102/68 mmHg  Pulse 81  Temp(Src) 97.8 F (36.6 C) (Oral)  Resp 20  Ht 5\' 7"  (1.702 m)  Wt 176 lb (79.833 kg)  BMI 27.56 kg/m2  SpO2 96%     Review of Systems  Constitutional: Negative for fever, chills, appetite change and fatigue.  HENT: Negative for congestion, dental problem, ear pain, hearing loss, sore throat, tinnitus, trouble swallowing and voice change.   Eyes: Negative for pain, discharge and visual disturbance.  Respiratory: Negative for cough, chest tightness, wheezing and stridor.   Cardiovascular: Negative for chest pain, palpitations and leg swelling.  Gastrointestinal: Negative for nausea, vomiting, abdominal pain, diarrhea, constipation, blood in stool and abdominal distention.  Genitourinary: Negative for  urgency, hematuria, flank pain, discharge, difficulty urinating and genital sores.  Musculoskeletal: Negative for myalgias, back pain, joint swelling, arthralgias, gait problem and neck stiffness.  Skin: Negative for rash.  Neurological: Negative for dizziness, syncope, speech difficulty, weakness, numbness and headaches.  Hematological: Negative for adenopathy. Does not bruise/bleed easily.  Psychiatric/Behavioral: Negative for behavioral problems and dysphoric mood. The patient is not nervous/anxious.        Objective:   Physical Exam  Constitutional: He is oriented to person, place, and time. He appears well-developed.  Blood pressure low normal  HENT:  Head: Normocephalic.  Right Ear: External ear normal.  Left Ear: External ear normal.  Eyes: Conjunctivae and EOM are normal.  Neck: Normal range of motion.  Cardiovascular: Normal rate and normal heart sounds.   Pulmonary/Chest: Breath sounds normal.  Abdominal: Bowel sounds are normal.  Musculoskeletal: Normal range of motion. He exhibits no edema or tenderness.  Neurological: He is alert and oriented to person, place, and time.  Psychiatric: He has a normal mood and affect. His behavior is normal.          Assessment & Plan:   Hypothyroidism.  We'll check a TSH History laryngeal cancer.  Stable Dyslipidemia.  Continue Crestor History of thoracic aortic aneurysm.  Follow-up cardiology History abnormal CPK.  Will check total CPK  CPX 6 months

## 2015-06-11 NOTE — Progress Notes (Signed)
Pre visit review using our clinic review tool, if applicable. No additional management support is needed unless otherwise documented below in the visit note. 

## 2015-06-11 NOTE — Patient Instructions (Signed)
Return in 6 months for follow-up  Heart-Healthy Eating Plan Many factors influence your heart health, including eating and exercise habits. Heart (coronary) risk increases with abnormal blood fat (lipid) levels. Heart-healthy meal planning includes limiting unhealthy fats, increasing healthy fats, and making other small dietary changes. This includes maintaining a healthy body weight to help keep lipid levels within a normal range. WHAT IS MY PLAN?  Your health care provider recommends that you:  Get no more than _________% of the total calories in your daily diet from fat.  Limit your intake of saturated fat to less than _________% of your total calories each day.  Limit the amount of cholesterol in your diet to less than _________ mg per day. WHAT TYPES OF FAT SHOULD I CHOOSE?  Choose healthy fats more often. Choose monounsaturated and polyunsaturated fats, such as olive oil and canola oil, flaxseeds, walnuts, almonds, and seeds.  Eat more omega-3 fats. Good choices include salmon, mackerel, sardines, tuna, flaxseed oil, and ground flaxseeds. Aim to eat fish at least two times each week.  Limit saturated fats. Saturated fats are primarily found in animal products, such as meats, butter, and cream. Plant sources of saturated fats include palm oil, palm kernel oil, and coconut oil.  Avoid foods with partially hydrogenated oils in them. These contain trans fats. Examples of foods that contain trans fats are stick margarine, some tub margarines, cookies, crackers, and other baked goods. WHAT GENERAL GUIDELINES DO I NEED TO FOLLOW?  Check food labels carefully to identify foods with trans fats or high amounts of saturated fat.  Fill one half of your plate with vegetables and green salads. Eat 4-5 servings of vegetables per day. A serving of vegetables equals 1 cup of raw leafy vegetables,  cup of raw or cooked cut-up vegetables, or  cup of vegetable juice.  Fill one fourth of your plate  with whole grains. Look for the word "whole" as the first word in the ingredient list.  Fill one fourth of your plate with lean protein foods.  Eat 4-5 servings of fruit per day. A serving of fruit equals one medium whole fruit,  cup of dried fruit,  cup of fresh, frozen, or canned fruit, or  cup of 100% fruit juice.  Eat more foods that contain soluble fiber. Examples of foods that contain this type of fiber are apples, broccoli, carrots, beans, peas, and barley. Aim to get 20-30 g of fiber per day.  Eat more home-cooked food and less restaurant, buffet, and fast food.  Limit or avoid alcohol.  Limit foods that are high in starch and sugar.  Avoid fried foods.  Cook foods by using methods other than frying. Baking, boiling, grilling, and broiling are all great options. Other fat-reducing suggestions include:  Removing the skin from poultry.  Removing all visible fats from meats.  Skimming the fat off of stews, soups, and gravies before serving them.  Steaming vegetables in water or broth.  Lose weight if you are overweight. Losing just 5-10% of your initial body weight can help your overall health and prevent diseases such as diabetes and heart disease.  Increase your consumption of nuts, legumes, and seeds to 4-5 servings per week. One serving of dried beans or legumes equals  cup after being cooked, one serving of nuts equals 1 ounces, and one serving of seeds equals  ounce or 1 tablespoon.  You may need to monitor your salt (sodium) intake, especially if you have high blood pressure. Talk with your  health care provider or dietitian to get more information about reducing sodium. WHAT FOODS CAN I EAT? Grains Breads, including [place], white, pita, wheat, raisin, rye, oatmeal, and [place]. Tortillas that are neither fried nor made with lard or trans fat. Low-fat rolls, including hotdog and hamburger buns and English muffins. Biscuits. Muffins. Waffles. Pancakes. Light popcorn.  Whole-grain cereals. Flatbread. Melba toast. Pretzels. Breadsticks. Rusks. Low-fat snacks and crackers, including oyster, saltine, matzo, graham, animal, and rye. Rice and pasta, including brown rice and those that are made with whole wheat. Vegetables All vegetables. Fruits All fruits, but limit coconut. Meats and Other Protein Sources Lean, well-trimmed beef, veal, pork, and lamb. Chicken and [place] without skin. All fish and shellfish. Wild duck, rabbit, pheasant, and venison. Egg whites or low-cholesterol egg substitutes. Dried beans, peas, lentils, and tofu.Seeds and most nuts. Dairy Low-fat or nonfat cheeses, including ricotta, string, and mozzarella. Skim or 1% milk that is liquid, powdered, or evaporated. Buttermilk that is made with low-fat milk. Nonfat or low-fat yogurt. Beverages Mineral water. Diet carbonated beverages. Sweets and Desserts Sherbets and fruit ices. Honey, jam, marmalade, jelly, and syrups. Meringues and gelatins. Pure sugar candy, such as hard candy, jelly beans, gumdrops, mints, marshmallows, and small amounts of dark chocolate. [employer] Eat all sweets and desserts in moderation. Fats and Oils Nonhydrogenated (trans-free) margarines. Vegetable oils, including soybean, sesame, sunflower, olive, peanut, safflower, corn, canola, and cottonseed. Salad dressings or mayonnaise that are made with a vegetable oil. Limit added fats and oils that you use for cooking, baking, salads, and as spreads. Other Cocoa powder. Coffee and tea. All seasonings and condiments. The items listed above may not be a complete list of recommended foods or beverages. Contact your dietitian for more options. WHAT FOODS ARE NOT RECOMMENDED? Grains Breads that are made with saturated or trans fats, oils, or whole milk. Croissants. Butter rolls. Cheese breads. Sweet rolls. Donuts. Buttered popcorn. Chow mein noodles. High-fat crackers, such as cheese or butter crackers. Meats and Other  Protein Sources Fatty meats, such as hotdogs, short ribs, sausage, spareribs, bacon, ribeye roast or steak, and mutton. High-fat deli meats, such as salami and bologna. Caviar. Domestic duck and goose. Organ meats, such as kidney, liver, sweetbreads, brains, gizzard, chitterlings, and heart. Dairy Cream, sour cream, cream cheese, and creamed cottage cheese. Whole milk cheeses, including blue (bleu), [place], [place], [place], American, [place], Swiss, [place], [place], and [place]. Whole or 2% milk that is liquid, evaporated, or condensed. Whole buttermilk. Cream sauce or high-fat cheese sauce. Yogurt that is made from whole milk. Beverages Regular sodas and drinks with added sugar. Sweets and Desserts Frosting. Pudding. Cookies. Cakes other than angel food cake. Candy that has milk chocolate or white chocolate, hydrogenated fat, butter, coconut, or unknown ingredients. Buttered syrups. Full-fat ice cream or ice cream drinks. Fats and Oils Gravy that has suet, meat fat, or shortening. Cocoa butter, hydrogenated oils, palm oil, coconut oil, palm kernel oil. These can often be found in baked products, candy, fried foods, nondairy creamers, and whipped toppings. Solid fats and shortenings, including bacon fat, salt pork, lard, and butter. Nondairy cream substitutes, such as coffee creamers and sour cream substitutes. Salad dressings that are made of unknown oils, cheese, or sour cream. The items listed above may not be a complete list of foods and beverages to avoid. Contact your dietitian for more information.   This information is not intended to replace advice given to you by your health care provider. Make sure you discuss  any questions you have with your health care provider.   Document Released: 04/08/2008 Document Revised: 07/21/2014 Document Reviewed: 12/22/2013 Elsevier Interactive Patient Education [employer].

## 2015-06-12 LAB — CK: Total CK: 103 U/L (ref 7–232)

## 2015-06-12 LAB — TSH: TSH: 36.78 u[IU]/mL — ABNORMAL HIGH (ref 0.35–4.50)

## 2015-06-18 DIAGNOSIS — F200 Paranoid schizophrenia: Secondary | ICD-10-CM | POA: Diagnosis not present

## 2015-07-24 ENCOUNTER — Encounter: Payer: Self-pay | Admitting: Internal Medicine

## 2015-07-24 ENCOUNTER — Ambulatory Visit (INDEPENDENT_AMBULATORY_CARE_PROVIDER_SITE_OTHER): Payer: Commercial Managed Care - HMO | Admitting: Internal Medicine

## 2015-07-24 VITALS — BP 118/80 | HR 81 | Temp 98.0°F | Resp 20 | Ht 67.0 in | Wt 176.0 lb

## 2015-07-24 DIAGNOSIS — E039 Hypothyroidism, unspecified: Secondary | ICD-10-CM | POA: Diagnosis not present

## 2015-07-24 DIAGNOSIS — E785 Hyperlipidemia, unspecified: Secondary | ICD-10-CM

## 2015-07-24 LAB — TSH: TSH: 26.25 u[IU]/mL — ABNORMAL HIGH (ref 0.35–4.50)

## 2015-07-24 LAB — T4, FREE: FREE T4: 0.96 ng/dL (ref 0.60–1.60)

## 2015-07-24 NOTE — Patient Instructions (Signed)
It is important that you exercise regularly, at least 20 minutes 3 to 4 times per week.  If you develop chest pain or shortness of breath seek  medical attention.  Return in 6 months for follow-up  

## 2015-07-24 NOTE — Progress Notes (Signed)
Subjective:    Patient ID: Andrew Jennings, male    DOB: 1961/01/25, 55 y.o.   MRN: [id number]  HPI  55 year old patient who has hypothyroidism.  TSH was elevated 6 weeks ago, but he had been noncompliant with his supplemental levothyroxine.  He states that over the past 6 weeks, he has only missed a single dose.  He generally feels well.  No other concerns or complaints.  Remains on statin therapy for dyslipidemia.  Past Medical History  Diagnosis Date  . Arthritis   . Depression   . Headache(784.0)   . Glaucoma   . Allergy   . Arrhythmia   . Hyperlipidemia   . Hypothyroid   . Schizophrenia ([place])   . 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 Sleep apnea   . Hemorrhoids   . Intestinal polyps   . 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 Adenoma of large intestine   . AAA (abdominal aortic aneurysm) [facility])     Social History   Social History  . Marital Status: Married    Spouse Name: N/A  . Number of Children: N/A  . Years of Education: N/A   Occupational History  . Not on file.   Social History Main Topics  . Smoking status: Former Smoker    Types: Cigarettes    Quit date: 05/02/2009  . Smokeless tobacco: Never Used  . Alcohol Use: Yes     Comment: occasional  . Drug Use: No  . Sexual Activity: Not on file   Other Topics Concern  . Not on file   Social History Narrative    Past Surgical History  Procedure Laterality Date  . Neck surgery      BLOOD CLOT IN NECK  . Umbilical hernia repair    . Colonoscopy      Family History  Problem Relation Age of Onset  . Arthritis Mother   . Hyperlipidemia Mother   . Heart disease Mother   . Arthritis Father   . Colon cancer Neg Hx     Allergies  Allergen Reactions  . Darvocet [Propoxyphene N-Acetaminophen] Other (See Comments)    "Makes me feel strange"..Per pt   . Penicillins     Makes sick, per pt     Current Outpatient Prescriptions on File Prior to Visit  Medication Sig Dispense Refill  . aspirin 81 MG  tablet Take 81 mg by mouth daily.    . CRESTOR 5 MG tablet TAKE 1 TABLET (5 MG TOTAL) BY MOUTH DAILY. 90 tablet 3  . diphenhydrAMINE (BENADRYL) 50 MG capsule Take 50 mg by mouth every 6 (six) hours as needed for itching or allergies.     . haloperidol (HALDOL) 10 MG tablet Take 30 mg by mouth daily.    . haloperidol (HALDOL) 5 MG tablet Take 5 mg by mouth daily.    Marland Kitchen levothyroxine (SYNTHROID, LEVOTHROID) 125 MCG tablet TAKE 1 TABLET (125 MCG TOTAL) BY MOUTH DAILY. 90 tablet 1  . levothyroxine (SYNTHROID, LEVOTHROID) 125 MCG tablet TAKE 1 TABLET (125 MCG TOTAL) BY MOUTH DAILY. 30 tablet 5  . rosuvastatin (CRESTOR) 5 MG tablet TAKE 1 TABLET (5 MG TOTAL) BY MOUTH DAILY. 90 tablet 3  . VIAGRA 100 MG tablet TAKE 1 TABLET (100 MG TOTAL) BY MOUTH AS NEEDED FOR ERECTILE DYSFUNCTION. 1 tablet 3   No current facility-administered medications on file prior to visit.    BP 118/80 mmHg  Pulse 81  Temp(Src) 98 F (36.7 C) (  Oral)  Resp 20  Ht 5\' 7"  (1.702 m)  Wt 176 lb (79.833 kg)  BMI 27.56 kg/m2  SpO2 97%     Review of Systems  Constitutional: Negative for fever, chills, appetite change and fatigue.  HENT: Negative for congestion, dental problem, ear pain, hearing loss, sore throat, tinnitus, trouble swallowing and voice change.   Eyes: Negative for pain, discharge and visual disturbance.  Respiratory: Negative for cough, chest tightness, wheezing and stridor.   Cardiovascular: Negative for chest pain, palpitations and leg swelling.  Gastrointestinal: Negative for nausea, vomiting, abdominal pain, diarrhea, constipation, blood in stool and abdominal distention.  Genitourinary: Negative for urgency, hematuria, flank pain, discharge, difficulty urinating and genital sores.  Musculoskeletal: Negative for myalgias, back pain, joint swelling, arthralgias, gait problem and neck stiffness.  Skin: Negative for rash.  Neurological: Negative for dizziness, syncope, speech difficulty, weakness, numbness  and headaches.  Hematological: Negative for adenopathy. Does not bruise/bleed easily.  Psychiatric/Behavioral: Negative for behavioral problems and dysphoric mood. The patient is not nervous/anxious.        Objective:   Physical Exam  Constitutional: He appears well-developed and well-nourished. No distress.  Cardiovascular: Normal rate and regular rhythm.   Pulmonary/Chest: Effort normal and breath sounds normal.  Musculoskeletal: He exhibits no edema.  Psychiatric: He has a normal mood and affect. His behavior is normal. Judgment and thought content normal.          Assessment & Plan:   Hypothyroidism.  Compliance stressed.  Will check a TSH  Recheck 6 months

## 2015-07-24 NOTE — Progress Notes (Signed)
Pre visit review using our clinic review tool, if applicable. No additional management support is needed unless otherwise documented below in the visit note. 

## 2015-07-25 ENCOUNTER — Other Ambulatory Visit: Payer: Self-pay | Admitting: Internal Medicine

## 2015-07-25 ENCOUNTER — Other Ambulatory Visit: Payer: Self-pay | Admitting: *Deleted

## 2015-07-25 DIAGNOSIS — E039 Hypothyroidism, unspecified: Secondary | ICD-10-CM

## 2015-07-25 MED ORDER — LEVOTHYROXINE SODIUM 150 MCG PO TABS: 150.0000 ug | ORAL_TABLET | Freq: Every day | ORAL | Status: DC

## 2015-08-03 ENCOUNTER — Other Ambulatory Visit: Payer: Commercial Managed Care - HMO

## 2015-09-03 ENCOUNTER — Other Ambulatory Visit (INDEPENDENT_AMBULATORY_CARE_PROVIDER_SITE_OTHER): Payer: Commercial Managed Care - HMO

## 2015-09-03 DIAGNOSIS — E039 Hypothyroidism, unspecified: Secondary | ICD-10-CM | POA: Diagnosis not present

## 2015-09-03 LAB — TSH: TSH: 4.05 u[IU]/mL (ref 0.35–4.50)

## 2015-09-11 ENCOUNTER — Telehealth: Payer: Self-pay | Admitting: Internal Medicine

## 2015-09-11 NOTE — Telephone Encounter (Signed)
Thyroid studies were normal.  No change in therapy

## 2015-09-11 NOTE — Telephone Encounter (Signed)
Pt calling for thyroid results. Please advise.

## 2015-09-11 NOTE — Telephone Encounter (Signed)
Pt would like blood work results from last week °

## 2015-09-11 NOTE — Telephone Encounter (Signed)
See other message sent to Dr.K.

## 2015-09-11 NOTE — Telephone Encounter (Addendum)
Pt call  about his tsh result stated he need to know if any change and want a call back soon as possible.

## 2015-09-11 NOTE — Telephone Encounter (Signed)
Spoke to pt, thyroid studies normal, no change in medication per Dr.K. Pt verbalized understanding.

## 2015-09-16 ENCOUNTER — Other Ambulatory Visit: Payer: Self-pay | Admitting: Internal Medicine

## 2015-09-17 DIAGNOSIS — F200 Paranoid schizophrenia: Secondary | ICD-10-CM | POA: Diagnosis not present

## 2015-12-03 ENCOUNTER — Encounter: Payer: Self-pay | Admitting: Internal Medicine

## 2015-12-03 ENCOUNTER — Telehealth: Payer: Self-pay | Admitting: Cardiovascular Disease

## 2015-12-03 ENCOUNTER — Encounter: Payer: Commercial Managed Care - HMO | Admitting: Internal Medicine

## 2015-12-03 ENCOUNTER — Ambulatory Visit (INDEPENDENT_AMBULATORY_CARE_PROVIDER_SITE_OTHER): Payer: Commercial Managed Care - HMO | Admitting: Internal Medicine

## 2015-12-03 VITALS — BP 100/62 | HR 81 | Temp 97.9°F | Ht 67.0 in | Wt 174.0 lb

## 2015-12-03 DIAGNOSIS — E039 Hypothyroidism, unspecified: Secondary | ICD-10-CM | POA: Diagnosis not present

## 2015-12-03 DIAGNOSIS — I712 Thoracic aortic aneurysm, without rupture, unspecified: Secondary | ICD-10-CM

## 2015-12-03 DIAGNOSIS — E785 Hyperlipidemia, unspecified: Secondary | ICD-10-CM | POA: Diagnosis not present

## 2015-12-03 DIAGNOSIS — R748 Abnormal levels of other serum enzymes: Secondary | ICD-10-CM | POA: Diagnosis not present

## 2015-12-03 DIAGNOSIS — C329 Malignant neoplasm of larynx, unspecified: Secondary | ICD-10-CM | POA: Diagnosis not present

## 2015-12-03 DIAGNOSIS — Z0000 Encounter for general adult medical examination without abnormal findings: Secondary | ICD-10-CM

## 2015-12-03 LAB — LIPID PANEL
CHOL/HDL RATIO: 6
Cholesterol: 224 mg/dL — ABNORMAL HIGH (ref 0–200)
HDL: 38.1 mg/dL — ABNORMAL LOW (ref >39.00)
LDL CALC: 150 mg/dL — AB (ref 0–99)
NONHDL: 185.58
Triglycerides: 178 mg/dL — ABNORMAL HIGH (ref 0.0–149.0)
VLDL: 35.6 mg/dL (ref 0.0–40.0)

## 2015-12-03 LAB — CBC WITH DIFFERENTIAL/PLATELET
BASOS PCT: 0.5 % (ref 0.0–3.0)
Basophils Absolute: 0 10*3/uL (ref 0.0–0.1)
EOS ABS: 0.1 10*3/uL (ref 0.0–0.7)
Eosinophils Relative: 1.5 % (ref 0.0–5.0)
HEMATOCRIT: 47.4 % (ref 39.0–52.0)
Hemoglobin: 15.9 g/dL (ref 13.0–17.0)
LYMPHS ABS: 1.7 10*3/uL (ref 0.7–4.0)
LYMPHS PCT: 22.3 % (ref 12.0–46.0)
MCHC: 33.6 g/dL (ref 30.0–36.0)
MCV: 87.7 fl (ref 78.0–100.0)
MONO ABS: 0.6 10*3/uL (ref 0.1–1.0)
Monocytes Relative: 7.5 % (ref 3.0–12.0)
NEUTROS ABS: 5.1 10*3/uL (ref 1.4–7.7)
NEUTROS PCT: 68.2 % (ref 43.0–77.0)
PLATELETS: 256 10*3/uL (ref 150.0–400.0)
RBC: 5.41 Mil/uL (ref 4.22–5.81)
RDW: 13.7 % (ref 11.5–15.5)
WBC: 7.5 10*3/uL (ref 4.0–10.5)

## 2015-12-03 LAB — COMPREHENSIVE METABOLIC PANEL
ALT: 16 U/L (ref 0–53)
AST: 16 U/L (ref 0–37)
Albumin: 4.6 g/dL (ref 3.5–5.2)
Alkaline Phosphatase: 46 U/L (ref 39–117)
BUN: 17 mg/dL (ref 6–23)
CHLORIDE: 104 meq/L (ref 96–112)
CO2: 29 meq/L (ref 19–32)
CREATININE: 1.06 mg/dL (ref 0.40–1.50)
Calcium: 9.7 mg/dL (ref 8.4–10.5)
GFR: 76.5 mL/min (ref >60.00)
GLUCOSE: 79 mg/dL (ref 70–99)
Potassium: 4.1 mEq/L (ref 3.5–5.1)
SODIUM: 141 meq/L (ref 135–145)
Total Bilirubin: 1.2 mg/dL (ref 0.2–1.2)
Total Protein: 6.7 g/dL (ref 6.0–8.3)

## 2015-12-03 LAB — TSH: TSH: 8.36 u[IU]/mL — ABNORMAL HIGH (ref 0.35–4.50)

## 2015-12-03 NOTE — Addendum Note (Signed)
Addended by: Gari Crown D on: 12/03/2015 02:08 PM   Modules accepted: Orders

## 2015-12-03 NOTE — Progress Notes (Signed)
Subjective:    Patient ID: Andrew Jennings, male    DOB: 21-Mar-1961, 55 y.o.   MRN: [id number]  HPI   Subjective:    Patient ID: Andrew Jennings, male    DOB: Dec 07, 1960, 55 y.o.   MRN: [id number]  HPI 55   -year-old patient who is seen today for a preventive health examination.  He has a history of laryngeal cancer and has recently been seen by oncology.  He has been followed by Dr. Redmond Baseman and has been released.  No recent laboratory data. He has dyslipidemia hypothyroidism and a history of schizophrenia and bipolar disorder. He has OSA and a history of ulcerative colitis. In general doing well today. He states that he has missed 2 doses of thyroid medication over the past 3 months.  He is followed by cardiology due to a thoracic aortic aneurysm and is scheduled for follow-up in July.  Past Medical History  Diagnosis Date  . Arthritis   . Depression   . Headache(784.0)   . Glaucoma   . Allergy   . Arrhythmia   . Hyperlipidemia   . Hypothyroid   . Schizophrenia ([place])   . 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 Sleep apnea   . Hemorrhoids   . Intestinal polyps   . 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 Adenoma of large intestine   . AAA (abdominal aortic aneurysm) [facility])     Social History   Social History  . Marital Status: Married    Spouse Name: N/A  . Number of Children: N/A  . Years of Education: N/A   Occupational History  . Not on file.   Social History Main Topics  . Smoking status: Former Smoker    Types: Cigarettes    Quit date: 05/02/2009  . Smokeless tobacco: Never Used  . Alcohol Use: Yes     Comment: occasional  . Drug Use: No  . Sexual Activity: Not on file   Other Topics Concern  . Not on file   Social History Narrative    Past Surgical History  Procedure Laterality Date  . Neck surgery      BLOOD CLOT IN NECK  . Umbilical hernia repair    . Colonoscopy      Family History  Problem Relation Age of Onset  . Arthritis Mother   .  Hyperlipidemia Mother   . Heart disease Mother   . Arthritis Father   . Colon cancer Neg Hx     Allergies  Allergen Reactions  . Darvocet [Propoxyphene N-Acetaminophen] Other (See Comments)    "Makes me feel strange"..Per pt   . Penicillins     Makes sick, per pt     Current Outpatient Prescriptions on File Prior to Visit  Medication Sig Dispense Refill  . aspirin 81 MG tablet Take 81 mg by mouth daily.    . CRESTOR 5 MG tablet TAKE 1 TABLET (5 MG TOTAL) BY MOUTH DAILY. 90 tablet 3  . diphenhydrAMINE (BENADRYL) 50 MG capsule Take 50 mg by mouth every 6 (six) hours as needed for itching or allergies.     . haloperidol (HALDOL) 10 MG tablet Take 30 mg by mouth daily.    . haloperidol (HALDOL) 5 MG tablet Take 5 mg by mouth daily.    Marland Kitchen levothyroxine (SYNTHROID, LEVOTHROID) 150 MCG tablet TAKE 1 TABLET (150 MCG TOTAL) BY MOUTH DAILY. 90 tablet 2  . rosuvastatin (CRESTOR) 5 MG tablet TAKE 1 TABLET (5 MG  TOTAL) BY MOUTH DAILY. 90 tablet 3  . VIAGRA 100 MG tablet TAKE 1 TABLET (100 MG TOTAL) BY MOUTH AS NEEDED FOR ERECTILE DYSFUNCTION. 1 tablet 3   No current facility-administered medications on file prior to visit.    BP 100/62 mmHg  Pulse 81  Temp(Src) 97.9 F (36.6 C) (Oral)  Ht 5\' 7"  (1.702 m)  Wt 174 lb (78.926 kg)  BMI 27.25 kg/m2  SpO2 97%   1. Risk factors, based on past  M,S,F history .  Cardiovascular risk factors include dyslipidemia And vascular disease  2.  Physical activities: No restrictions.  Goes to the gym several times per week  3.  Depression/mood: History of schizophrenia and bipolar depression  4.  Hearing: No deficits  5.  ADL's: Independent in all aspects of daily living  6.  Fall risk: Low  7.  Home safety: No problems identified  8.  Height weight, and visual acuity; height weight, stable.  No change in visual acuity  9.  Counseling: Compliance with medication.  Encouraged 10. Lab orders based on risk factors: Followup aortic  ultrasound  11. Referral : Follow GI oncology and psychiatry  12. Care plan: Continue present medical regimen  13. Cognitive assessment: Alert and oriented with normal affect  14.  Preventive services will include an annual examination with screening lab.  His thoracic aortic aneurysm.  Will be monitored at least annually.  He will continue on 5 year interval colonoscopies.  15.  Provider list includes cardiology, oncology, ENT, ophthalmology and GI as well as psychiatry      Review of Systems  Constitutional: Negative for fever, chills, appetite change and fatigue.  HENT: Negative for congestion, dental problem, ear pain, hearing loss, sore throat, tinnitus, trouble swallowing and voice change.   Eyes: Negative for pain, discharge and visual disturbance.  Respiratory: Negative for cough, chest tightness, wheezing and stridor.   Cardiovascular: Negative for chest pain, palpitations and leg swelling.  Gastrointestinal: Positive for abdominal pain. Negative for nausea, vomiting, diarrhea, constipation, blood in stool and abdominal distention.  Genitourinary: Negative for urgency, hematuria, flank pain, discharge, difficulty urinating and genital sores.  Musculoskeletal: Negative for arthralgias, back pain, gait problem, joint swelling, myalgias and neck stiffness.  Skin: Negative for rash.  Neurological: Negative for dizziness, syncope, speech difficulty, weakness, numbness and headaches.  Hematological: Negative for adenopathy. Does not bruise/bleed easily.  Psychiatric/Behavioral: Negative for behavioral problems and dysphoric mood. The patient is not nervous/anxious.        Objective:   Physical Exam  Constitutional: He is oriented to person, place, and time. He appears well-developed and well-nourished.  Blood pressure low normal  HENT:  Head: Normocephalic and atraumatic.  Right Ear: External ear normal.  Left Ear: External ear normal.  Nose: Nose normal.  Mouth/Throat:  Oropharynx is clear and moist.  Eyes: Conjunctivae and EOM are normal. Pupils are equal, round, and reactive to light. No scleral icterus.  Neck: Normal range of motion. Neck supple. No JVD present. No thyromegaly present.  Cardiovascular: Normal rate, regular rhythm, normal heart sounds and intact distal pulses.  Exam reveals no gallop and no friction rub.   No murmur heard. Pulmonary/Chest: Effort normal and breath sounds normal. He exhibits no tenderness.  Abdominal: Soft. Bowel sounds are normal. He exhibits no distension and no mass. There is no tenderness.  Genitourinary: Prostate normal and penis normal. Guaiac negative stool.  Uncircumcised  Musculoskeletal: Normal range of motion. He exhibits no edema and no tenderness.  Lymphadenopathy:  He has no cervical adenopathy.  Neurological: He is alert and oriented to person, place, and time. He has normal reflexes. No cranial nerve deficit. Coordination normal.  Parkinsonian tremor, right hand  Skin: Skin is warm and dry. No rash noted.  Psychiatric: He has a normal mood and affect. His behavior is normal.          Assessment & Plan:   Preventive health examination Dyslipidemia. We'll continue Crestor.  Hypothyroid.  Compliance with his medications.  Encouraged  Recheck 1 year Followup oncology GI and psychiatry Followup abdominal ultrasound Per cardiology   Review of Systems  As above     Objective:   Physical Exam  Skin:  Small surgical scars anterior neck and umbilical area   As above      Assessment & Plan:  Preventive health examination Thoracic aortic aneurysm.  Will continue aggressive risk factor modification with statin therapy.  Blood pressure is well-controlled today in a low-normal range Laryngeal cancer.  Will continue close observation Schizophrenia/bipolar disorder.  Follow-up psychiatry Hypothyroidism.  We'll check a TSH Dyslipidemia.  Continue statin therapy  [name], MD

## 2015-12-03 NOTE — Patient Instructions (Signed)
It is important that you exercise regularly, at least 20 minutes 3 to 4 times per week.  If you develop chest pain or shortness of breath seek  medical attention.  Cardiology follow-up as scheduled  Return in one year or as needed

## 2015-12-03 NOTE — Telephone Encounter (Signed)
New Message:  Please,would not give any details.

## 2015-12-03 NOTE — Telephone Encounter (Signed)
Left message for patient to call me back on Wednesday because I will not be in the office tomorrow.

## 2015-12-04 ENCOUNTER — Telehealth: Payer: Self-pay | Admitting: Internal Medicine

## 2015-12-04 LAB — CK TOTAL AND CKMB (NOT AT ARMC)
CK TOTAL: 97 U/L (ref 7–232)
CK, MB: 1 ng/mL (ref 0.0–5.0)
RELATIVE INDEX: 1 (ref 0.0–4.0)

## 2015-12-04 LAB — HEPATITIS C ANTIBODY: HCV AB: NEGATIVE

## 2015-12-04 NOTE — Telephone Encounter (Signed)
Pt has new pharm [facility] crestor, haldol, levothyroxine #90 w/refills and viagra #12 for 90 day supply

## 2015-12-05 MED ORDER — ROSUVASTATIN CALCIUM 5 MG PO TABS: ORAL_TABLET | ORAL | Status: DC

## 2015-12-05 MED ORDER — LEVOTHYROXINE SODIUM 150 MCG PO TABS: ORAL_TABLET | ORAL | Status: DC

## 2015-12-05 MED ORDER — SILDENAFIL CITRATE 100 MG PO TABS: ORAL_TABLET | ORAL | Status: DC

## 2015-12-05 NOTE — Telephone Encounter (Signed)
Spoke to pt, told him Rx's sent to pharmacy except Haldol need to contact Pysciatrist for refill. Pt verbalized understanding.

## 2015-12-05 NOTE — Telephone Encounter (Signed)
Spoke with patient who called to schedule appointments for his mother and him on the same day.  They are scheduled for 03/27/16.  He thanked me for my help.

## 2015-12-06 ENCOUNTER — Telehealth: Payer: Self-pay | Admitting: Cardiovascular Disease

## 2015-12-06 NOTE — Telephone Encounter (Signed)
Mr. Su Ley is calling to speak w/ Sharyn Lull . (only wanted to speak with Sharyn Lull)

## 2015-12-07 NOTE — Telephone Encounter (Signed)
Spoke with patient who called to request that his appointment time be changed.  I have made the change in epic.

## 2015-12-20 ENCOUNTER — Telehealth: Payer: Self-pay | Admitting: Internal Medicine

## 2015-12-20 NOTE — Telephone Encounter (Signed)
Noted, chart was updated  

## 2015-12-20 NOTE — Telephone Encounter (Signed)
FYI pt just wanted to let you all know his New Pharm: [facility]

## 2015-12-24 DIAGNOSIS — F200 Paranoid schizophrenia: Secondary | ICD-10-CM | POA: Diagnosis not present

## 2016-02-05 ENCOUNTER — Encounter: Payer: Self-pay | Admitting: Internal Medicine

## 2016-02-05 ENCOUNTER — Ambulatory Visit (INDEPENDENT_AMBULATORY_CARE_PROVIDER_SITE_OTHER): Payer: Medicare Other | Admitting: Internal Medicine

## 2016-02-05 VITALS — BP 98/72 | HR 80 | Temp 98.0°F | Ht 67.0 in | Wt 176.1 lb

## 2016-02-05 DIAGNOSIS — E785 Hyperlipidemia, unspecified: Secondary | ICD-10-CM

## 2016-02-05 DIAGNOSIS — M791 Myalgia: Secondary | ICD-10-CM

## 2016-02-05 DIAGNOSIS — IMO0001 Reserved for inherently not codable concepts without codable children: Secondary | ICD-10-CM

## 2016-02-05 DIAGNOSIS — E034 Atrophy of thyroid (acquired): Secondary | ICD-10-CM

## 2016-02-05 DIAGNOSIS — E038 Other specified hypothyroidism: Secondary | ICD-10-CM | POA: Diagnosis not present

## 2016-02-05 DIAGNOSIS — M609 Myositis, unspecified: Secondary | ICD-10-CM

## 2016-02-05 NOTE — Progress Notes (Signed)
Subjective:    Patient ID: Andrew Jennings, male    DOB: 04/06/1961, 55 y.o.   MRN: [id number]  HPI 55 year old patient who was seen 2 months ago for his annual exam.  He states that he has been exercising more regularly and has developed some muscle weakness.  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  Is requesting a follow-up CPK  Past Medical History:  Diagnosis Date  . AAA (abdominal aortic aneurysm) ([place])   . Adenoma of large intestine   . Allergy   . Arrhythmia   . Arthritis   . Depression   . Glaucoma   . Headache(784.0)   . Hemorrhoids   . Hyperlipidemia   . Hypothyroid   . Intestinal polyps   . 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 Schizophrenia ([place])   . Sleep apnea   . Terminal ileitis [facility])      Social History   Social History  . Marital status: Married    Spouse name: N/A  . Number of children: N/A  . Years of education: N/A   Occupational History  . Not on file.   Social History Main Topics  . Smoking status: Former Smoker    Types: Cigarettes    Quit date: 05/02/2009  . Smokeless tobacco: Never Used  . Alcohol use Yes     Comment: occasional  . Drug use: No  . Sexual activity: Not on file   Other Topics Concern  . Not on file   Social History Narrative  . No narrative on file    Past Surgical History:  Procedure Laterality Date  . COLONOSCOPY    . NECK SURGERY     BLOOD CLOT IN NECK  . UMBILICAL HERNIA REPAIR      Family History  Problem Relation Age of Onset  . Arthritis Mother   . Hyperlipidemia Mother   . Heart disease Mother   . Arthritis Father   . Colon cancer Neg Hx     Allergies  Allergen Reactions  . Darvocet [Propoxyphene N-Acetaminophen] Other (See Comments)    "Makes me feel strange"..Per pt   . Penicillins     Makes sick, per pt     Current Outpatient Prescriptions on File Prior to Visit  Medication Sig Dispense Refill  . aspirin 81 MG tablet  Take 81 mg by mouth daily.    . diphenhydrAMINE (BENADRYL) 50 MG capsule Take 50 mg by mouth every 6 (six) hours as needed for itching or allergies.     . haloperidol (HALDOL) 10 MG tablet Take 30 mg by mouth daily.    . haloperidol (HALDOL) 5 MG tablet Take 5 mg by mouth daily.    Marland Kitchen levothyroxine (SYNTHROID, LEVOTHROID) 150 MCG tablet TAKE 1 TABLET (150 MCG TOTAL) BY MOUTH DAILY. 90 tablet 3  . rosuvastatin (CRESTOR) 5 MG tablet TAKE 1 TABLET (5 MG TOTAL) BY MOUTH DAILY. 90 tablet 3  . sildenafil (VIAGRA) 100 MG tablet TAKE 1 TABLET (100 MG TOTAL) BY MOUTH AS NEEDED FOR ERECTILE DYSFUNCTION. 12 tablet 3   No current facility-administered medications on file prior to visit.     BP 98/72   Pulse 80   Temp 98 F (36.7 C) (Oral)   Ht 5\' 7"  (1.702 m)   Wt 176 lb 2 oz (79.9 kg)   SpO2 97%   BMI 27.59 kg/m     Review of Systems  Constitutional:  Negative.   HENT: Negative.   Respiratory: Negative.   Gastrointestinal: Negative.   Genitourinary: Negative.   Musculoskeletal: Positive for myalgias.       Objective:   Physical Exam  Constitutional: He appears well-developed and well-nourished. No distress.  Blood pressure low normal  Cardiovascular: Normal rate and regular rhythm.   Pulmonary/Chest: Effort normal and breath sounds normal.  Musculoskeletal: Normal range of motion.  No muscle tenderness          Assessment & Plan:   History of exercise associated myositis.  Will check CPK.  Patient also requests HIV  Follow-up CPX one year  [name], MD

## 2016-02-05 NOTE — Progress Notes (Signed)
Pre visit review using our clinic review tool, if applicable. No additional management support is needed unless otherwise documented below in the visit note. 

## 2016-02-05 NOTE — Patient Instructions (Signed)
It is important that you exercise regularly, at least 20 minutes 3 to 4 times per week.  If you develop chest pain or shortness of breath seek  medical attention.  Return in one year for follow-up   

## 2016-02-06 LAB — CK TOTAL AND CKMB (NOT AT ARMC)
CK TOTAL: 104 U/L (ref 7–232)
CK, MB: 1.1 ng/mL (ref 0.0–5.0)
Relative Index: 1.1 (ref 0.0–4.0)

## 2016-02-06 LAB — HIV ANTIBODY (ROUTINE TESTING W REFLEX): HIV 1&2 Ab, 4th Generation: NONREACTIVE

## 2016-02-28 ENCOUNTER — Encounter: Payer: Self-pay | Admitting: Cardiovascular Disease

## 2016-03-27 ENCOUNTER — Ambulatory Visit: Payer: Commercial Managed Care - HMO | Admitting: Cardiovascular Disease

## 2016-04-09 ENCOUNTER — Telehealth: Payer: Self-pay | Admitting: Internal Medicine

## 2016-04-09 MED ORDER — SILDENAFIL CITRATE 100 MG PO TABS: ORAL_TABLET | ORAL | 3 refills | Status: DC

## 2016-04-09 NOTE — Telephone Encounter (Signed)
Pt need new Rx for Viagra  Pt state he really need to have it today if possible.   Pharm:  [facility]

## 2016-04-09 NOTE — Telephone Encounter (Signed)
Left message on voicemail Rx resent to corrected pharmacy.

## 2016-04-09 NOTE — Telephone Encounter (Signed)
° °  Pt request refill of the following:   sildenafil (VIAGRA) 100 MG tablet   RX was sent to [facility]     Phamacy:  [facility]  [address]

## 2016-04-09 NOTE — Telephone Encounter (Signed)
Spoke to pt, told him Rx sent to pharmacy as requested. Pt verbalized understanding.

## 2016-04-11 ENCOUNTER — Ambulatory Visit (INDEPENDENT_AMBULATORY_CARE_PROVIDER_SITE_OTHER): Payer: Medicare Other | Admitting: Internal Medicine

## 2016-04-11 ENCOUNTER — Encounter: Payer: Self-pay | Admitting: Internal Medicine

## 2016-04-11 VITALS — BP 102/70 | HR 80 | Temp 98.0°F | Resp 20 | Ht 67.0 in | Wt 173.0 lb

## 2016-04-11 DIAGNOSIS — E038 Other specified hypothyroidism: Secondary | ICD-10-CM | POA: Diagnosis not present

## 2016-04-11 DIAGNOSIS — G248 Other dystonia: Secondary | ICD-10-CM

## 2016-04-11 DIAGNOSIS — E034 Atrophy of thyroid (acquired): Secondary | ICD-10-CM | POA: Diagnosis not present

## 2016-04-11 NOTE — Progress Notes (Signed)
Pre visit review using our clinic review tool, if applicable. No additional management support is needed unless otherwise documented below in the visit note. 

## 2016-04-11 NOTE — Progress Notes (Signed)
Subjective:    Patient ID: Andrew Jennings, male    DOB: [birth year], 55 y.o.   MRN: [id number]  HPI  55 year old patient who is seen today concerned about possible "mini strokes".  Over the past few weeks she has had 3 episodes of a drawling or pulling sensation involving the left facial area.  He states that he has episodes where he has apparent spasm in the left facial area the last 3-5 seconds.  Medical regimen does include high-dose Haldol.  He is followed by Dr. Casimiro Jennings  Past Medical History:  Diagnosis Date  . AAA (abdominal aortic aneurysm) ([place])   . Adenoma of large intestine   . Allergy   . Arrhythmia   . Arthritis   . Depression   . Glaucoma   . Headache(784.0)   . Hemorrhoids   . Hyperlipidemia   . Hypothyroid   . Intestinal polyps   . 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 Schizophrenia ([place])   . Sleep apnea   . Terminal ileitis [facility])      Social History   Social History  . Marital status: Married    Spouse name: N/A  . Number of children: N/A  . Years of education: N/A   Occupational History  . Not on file.   Social History Main Topics  . Smoking status: Former Smoker    Types: Cigarettes    Quit date: 05/02/2009  . Smokeless tobacco: Never Used  . Alcohol use Yes     Comment: occasional  . Drug use: No  . Sexual activity: Not on file   Other Topics Concern  . Not on file   Social History Narrative  . No narrative on file    Past Surgical History:  Procedure Laterality Date  . COLONOSCOPY    . NECK SURGERY     BLOOD CLOT IN NECK  . UMBILICAL HERNIA REPAIR      Family History  Problem Relation Age of Onset  . Arthritis Mother   . Hyperlipidemia Mother   . Heart disease Mother   . Arthritis Father   . Colon cancer Neg Hx     Allergies  Allergen Reactions  . Darvocet [Propoxyphene N-Acetaminophen] Other (See Comments)    "Makes me feel strange"..Per pt   . Penicillins     Makes sick, per pt     Current Outpatient  Prescriptions on File Prior to Visit  Medication Sig Dispense Refill  . aspirin 81 MG tablet Take 81 mg by mouth daily.    . diphenhydrAMINE (BENADRYL) 50 MG capsule Take 50 mg by mouth every 6 (six) hours as needed for itching or allergies.     . haloperidol (HALDOL) 10 MG tablet Take 30 mg by mouth daily.    . haloperidol (HALDOL) 5 MG tablet Take 5 mg by mouth daily.    Marland Kitchen levothyroxine (SYNTHROID, LEVOTHROID) 150 MCG tablet TAKE 1 TABLET (150 MCG TOTAL) BY MOUTH DAILY. 90 tablet 3  . rosuvastatin (CRESTOR) 5 MG tablet TAKE 1 TABLET (5 MG TOTAL) BY MOUTH DAILY. 90 tablet 3  . sildenafil (VIAGRA) 100 MG tablet TAKE 1 TABLET (100 MG TOTAL) BY MOUTH AS NEEDED FOR ERECTILE DYSFUNCTION. 12 tablet 3   No current facility-administered medications on file prior to visit.     BP 102/70 (BP Location: Right Arm, Patient Position: Sitting, Cuff Size: Normal)   Pulse 80   Temp 98 F (36.7 C) (Oral)   Resp 20  Ht 5\' 7"  (1.702 m)   Wt 173 lb (78.5 kg)   SpO2 96%   BMI 27.10 kg/m    Review of Systems  Neurological: Positive for facial asymmetry.       Objective:   Physical Exam  Constitutional: He is oriented to person, place, and time. He appears well-developed and well-nourished. No distress.  Neurological: He is alert and oriented to person, place, and time. He has normal reflexes. A cranial nerve deficit is present. Coordination normal.  Normal neuro exam.  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  Follow-up psychiatry  Patient reassured that this is not a TIA  Andrew Jennings

## 2016-04-11 NOTE — Patient Instructions (Signed)
[  Follow with Dr. Casimiro Needle  as scheduled]  Call or return to clinic prn if these symptoms worsen or fail to improve as anticipated. I

## 2016-04-14 ENCOUNTER — Other Ambulatory Visit: Payer: Self-pay | Admitting: *Deleted

## 2016-04-14 ENCOUNTER — Telehealth: Payer: Self-pay | Admitting: *Deleted

## 2016-04-14 MED ORDER — SILDENAFIL CITRATE 20 MG PO TABS: ORAL_TABLET | ORAL | 2 refills | Status: DC

## 2016-04-14 NOTE — Telephone Encounter (Signed)
Please call patient and schedule appointment in 6 months to see Dr.K. 6 month follow.     PLEASE NOTE: All timestamps contained within this report are represented as [place] Standard Time. CONFIDENTIALTY NOTICE: This fax transmission is intended only for the addressee. It contains information that is legally privileged, confidential or otherwise protected from use or disclosure. If you are not the intended recipient, you are strictly prohibited from reviewing, disclosing, copying using or disseminating any of this information or taking any action in reliance on or regarding this information. If you have received this fax in error, please notify us immediately by telephone so that we can arrange for its return to [place]. Phone: [phone], Toll-Free: [phone], Fax: [phone] Page: 1 of 1 Call Id: [id number] [facility] - Client Nonclinical Telephone Record [facility] - Client Client Site [facility] - Night Physician Simonne Martinet - MD Contact Type Call   Who Is Calling Patient / Member / Family / Caregiver Caller Name Saliou Mcilvain Caller Phone Number [phone] Patient Name [facility] Call Type Message Only Information Provided Reason for Call Request to Schedule Office Appointment Initial Comment Caller doesn't know if the doctor wants to see him in 6 months or not Additional Comment caller was at the office today and his paperwork states the doctor wants to see him in 6 months so he isn't sure of he needs to make an appointment or not. Call Closed By: Verner Chol Transaction Date/Time: 04/11/2016 5:27:10 PM (ET)

## 2016-04-17 ENCOUNTER — Telehealth: Payer: Self-pay | Admitting: Internal Medicine

## 2016-04-17 NOTE — Telephone Encounter (Signed)
Please advise 

## 2016-04-17 NOTE — Telephone Encounter (Signed)
Left message with family member to call office. Okay per Tommi Rumps to drink Alcohol.

## 2016-04-17 NOTE — Telephone Encounter (Signed)
[facility] - Client [facility] Patient Name: Andrew Jennings DOB: 1960-12-22 Initial Comment Caller states he is wanting to know if he can have alcoholic beverages w/ Sildenafil 20 mg, generic for Viagra. Nurse Assessment Nurse: Dimas Chyle, RN, Dellis Filbert Date/Time Eilene Ghazi Time): 04/17/2016 3:45:38 PM Confirm and document reason for call. If symptomatic, describe symptoms. You must click the next button to save text entered. ---Caller states he is wanting to know if he can have alcoholic beverages w/ Sildenafil 20 mg, generic for Viagra. Has the patient traveled out of the country within the last 30 days? ---No Does the patient have any new or worsening symptoms? ---No Please document clinical information provided and list any resource used. ---Interactions between your selected drugs Moderate ethanol sildenafil Applies to: Alcohol (contained in alcoholic beverages) (ethanol), sildenafil Sildenafil can lower blood pressure, and combining it with ethanol may further increase this effect. You may be more likely to experience symptoms such as dizziness, lightheadedness, fainting, flushing, headache, and heart palpitations. You should avoid or limit the use of alcohol while being treated with sildenafil, and use caution when getting up from a sitting or lying position. Per [employer].com Guidelines Guideline Title Affirmed Question Affirmed Notes Final Disposition User Clinical Call [place], RN, Dellis Filbert

## 2016-04-21 NOTE — Telephone Encounter (Signed)
Left message on voicemail to call office.  

## 2016-04-21 NOTE — Telephone Encounter (Signed)
Pt called back, told him Andrew Jennings said okay to drink alcohol but not a lot. Pt verbalized understanding.

## 2016-04-29 ENCOUNTER — Telehealth: Payer: Self-pay | Admitting: Cardiovascular Disease

## 2016-04-29 NOTE — Telephone Encounter (Signed)
New message      Pt has an upcoming appt with Dr Acie Fredrickson.  Calling to see if he needs to be fasting for labs?

## 2016-04-29 NOTE — Telephone Encounter (Signed)
Left pt a message to call back. 

## 2016-04-30 ENCOUNTER — Encounter: Payer: Self-pay | Admitting: Cardiovascular Disease

## 2016-04-30 ENCOUNTER — Telehealth: Payer: Self-pay | Admitting: Cardiovascular Disease

## 2016-04-30 ENCOUNTER — Ambulatory Visit (INDEPENDENT_AMBULATORY_CARE_PROVIDER_SITE_OTHER): Payer: Medicare Other | Admitting: Cardiovascular Disease

## 2016-04-30 VITALS — BP 100/80 | HR 63 | Ht 67.0 in | Wt 175.8 lb

## 2016-04-30 DIAGNOSIS — I712 Thoracic aortic aneurysm, without rupture, unspecified: Secondary | ICD-10-CM

## 2016-04-30 DIAGNOSIS — E782 Mixed hyperlipidemia: Secondary | ICD-10-CM

## 2016-04-30 MED ORDER — ROSUVASTATIN CALCIUM 10 MG PO TABS: 10.0000 mg | ORAL_TABLET | Freq: Every day | ORAL | 3 refills | Status: DC

## 2016-04-30 NOTE — Telephone Encounter (Signed)
Advised patient that he does not have to come to appointment fasting for lab work.  He verbalized understanding and agreement.

## 2016-04-30 NOTE — Telephone Encounter (Signed)
Follow Up:; ° ° °Returning your call. °

## 2016-04-30 NOTE — Patient Instructions (Addendum)
Medication Instructions:  INCREASE Crestor to 10 mg once daily   Labwork: Your physician recommends that you return for lab work in: 3 months  You will need to FAST for this appointment - nothing to eat or drink after midnight the night before except water.    Testing/Procedures: Non-Cardiac CT Angiography (CTA), is a special type of CT scan that uses a computer to produce multi-dimensional views of major blood vessels throughout the body. In CT angiography, a contrast material is injected through an IV to help visualize the blood vessels   Follow-Up: Your physician wants you to follow-up in: 1 year with Dr. Acie Fredrickson.  You will receive a reminder letter in the mail two months in advance. If you don't receive a letter, please call our office to schedule the follow-up appointment.   If you need a refill on your cardiac medications before your next appointment, please call your pharmacy.   Thank you for choosing [facility]! Christen Bame, RN [phone]

## 2016-04-30 NOTE — Progress Notes (Signed)
Lionel December Date of Birth  1961/03/22       [facility]    [employer] [address], [place], [place]  [address]   [place], [place]  [address] [place]   Fax  [phone]     Fax [phone]  Problem List: 1. Thoracic aortic aneurism - small  2. Hyperlipidemia 3. Laryngeal cancer ~2010, XRT 4. Bipolar disorder    History of Present Illness:  Cairon is a 55 year old gentleman who presents today for followup of his thoracic aortic aneurysm. He was originally told in [place] that he had a thoracic aneurysm. He was previously followed by Dr. Rollene Fare.    Occasional CP - seems musculoskelatal.  Walks 20-30 minutes a day.   Used to smoke - stopped when he developed larangyeal cancer  January 22, 2015:  Olamide is seen in follow up for his ascending aortic aneurism. The MRI from 2015 reveals:  IMPRESSION: Maximal diameter of the ascending aorta above the valve has increased from 3.8 cm to 4.2 cm. At the sino-tubular junction, it has increased from 3.8 cm to 4.1 cm. It is grossly unchanged at the sinus of Valsalva.  He did not like the MRA because he was not able to hold his breath that long .  He is doing well.   Oct. 18, 2017:  Mcneil is seen today for follow-up of his thoracic aortic aneurysm. He had an MRI scan in 2015. He does not want have an MRI a again because it requires him to hold his breath for   He denies any chest pain or shortness of breath. His blood pressure and heart rate are normal.  Current Outpatient Prescriptions on File Prior to Visit  Medication Sig Dispense Refill  . aspirin 81 MG tablet Take 81 mg by mouth daily.    . diphenhydrAMINE (BENADRYL) 50 MG capsule Take 50 mg by mouth every 6 (six) hours as needed for itching or allergies.     . haloperidol (HALDOL) 10 MG tablet Take 30 mg by mouth daily.    . haloperidol (HALDOL) 5 MG tablet Take 5 mg by mouth daily.    Marland Kitchen  levothyroxine (SYNTHROID, LEVOTHROID) 150 MCG tablet TAKE 1 TABLET (150 MCG TOTAL) BY MOUTH DAILY. 90 tablet 3  . rosuvastatin (CRESTOR) 5 MG tablet TAKE 1 TABLET (5 MG TOTAL) BY MOUTH DAILY. 90 tablet 3  . sildenafil (REVATIO) 20 MG tablet TAKE ONE TO FIVE TABLETS BY MOUTH AS NEEDED 20 tablet 2   No current facility-administered medications on file prior to visit.     Allergies  Allergen Reactions  . Darvocet [Propoxyphene N-Acetaminophen] Other (See Comments)    "Makes me feel strange"..Per pt   . Penicillins     Makes sick, per pt     Past Medical History:  Diagnosis Date  . AAA (abdominal aortic aneurysm) ([place])   . Adenoma of large intestine   . Allergy   . Arrhythmia   . Arthritis   . Depression   . Glaucoma   . Headache(784.0)   . Hemorrhoids   . Hyperlipidemia   . Hypothyroid   . Intestinal polyps   . 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 Schizophrenia ([place])   . Sleep apnea   . Terminal ileitis [facility])     Past Surgical History:  Procedure Laterality Date  . COLONOSCOPY    . NECK  SURGERY     BLOOD CLOT IN NECK  . UMBILICAL HERNIA REPAIR      History  Smoking Status  . Former Smoker  . Types: Cigarettes  . Quit date: 05/02/2009  Smokeless Tobacco  . Never Used    History  Alcohol Use  . Yes    Comment: occasional    Family History  Problem Relation Age of Onset  . Arthritis Mother   . Hyperlipidemia Mother   . Heart disease Mother   . Arthritis Father   . Colon cancer Neg Hx     Reviw of Systems:  Reviewed in the HPI.  All other systems are negative.  Physical Exam: Blood pressure 100/80, pulse 63, height 5\' 7"  (1.702 m), weight 175 lb 12.8 oz (79.7 kg). Wt Readings from Last 3 Encounters:  04/30/16 175 lb 12.8 oz (79.7 kg)  04/11/16 173 lb (78.5 kg)  02/05/16 176 lb 2 oz (79.9 kg)     General: Well developed, well nourished, in no acute distress.  Head: Normocephalic, atraumatic, sclera non-icteric, mucus membranes are  moist,   Neck: Supple. Carotids are 2 + without bruits. No JVD   Lungs: Clear   Heart: RR, normal S1S2  Abdomen: Soft, non-tender, non-distended with normal bowel sounds.  Msk:  Strength and tone are normal   Extremities: No clubbing or cyanosis. No edema.  Distal pedal pulses are 2+ and equal    Neuro: CN II - XII intact.  Alert and oriented X 3.   Psych:  Normal  ECG: Apr 30, 2016  NSR at 4.  Normal ECG     Assessment / Plan:   1. Thoracic aortic aneurism -  His aortic measurements are stable .  he did not do well in the MRI scanner and does not want to have any more .  Will do a CT of the chest . I'll see him in 1 year    2. Hyperlipidemia - on crestor 5 mg a day , increase to 10 mg a day  Check labs in 3 months   3. Laryngeal cancer ~2010, XRT 4. Bipolar disorder     Mertie Moores, MD  04/30/2016 3:50 PM    [place],  [place], [place]  [address] Pager [phone] Phone: [phone]; Fax: [phone]   [facility]  [address] [place], [place]  [address] [phone]    Fax [phone]

## 2016-05-01 MED ORDER — ROSUVASTATIN CALCIUM 10 MG PO TABS: 10.0000 mg | ORAL_TABLET | Freq: Every day | ORAL | 3 refills | Status: DC

## 2016-05-01 NOTE — Telephone Encounter (Signed)
Spoke with patient who advised that rosuvastatin Rx was sent to wrong pharmacy.  I apologized and advised him that I have cancelled the Rx to Andrew Jennings and have sent new Rx to [facility] per his request.  He thanked me for the call.

## 2016-05-02 ENCOUNTER — Telehealth: Payer: Self-pay | Admitting: Nurse Practitioner

## 2016-05-02 DIAGNOSIS — I712 Thoracic aortic aneurysm, without rupture, unspecified: Secondary | ICD-10-CM

## 2016-05-02 NOTE — Telephone Encounter (Signed)
Spoke with patient who states he cannot hold his breath in the CT machine and does not want to have the CT angiogram for evaluation of his thoracic aneurysm.  He requests a vascular u/s of aorta.  Dr. Acie Fredrickson advised that this test will not be appropriate to evaluate the aneurysm.  I spoke with Marzetta Board in [place] and she advised patient would need to hold his breath for 12-20 seconds.  He states he is unable to do this.  I advised that there is not a test that we can order that would give [place] the view we need of the aorta without holding breath for at least a short period of time.  He verbalized understanding and agreement.

## 2016-06-02 ENCOUNTER — Telehealth: Payer: Self-pay | Admitting: Internal Medicine

## 2016-06-02 NOTE — Telephone Encounter (Signed)
Okay to use 4:15 slot.

## 2016-06-02 NOTE — Telephone Encounter (Signed)
° ° °  Pt call to say he need to see Dr Raliegh Ip to discuss a blood test that he need to have. He said he need an afternoon appt and Dr Raliegh Ip does not have anything other than the 4:15 pm slot

## 2016-06-02 NOTE — Telephone Encounter (Signed)
Pt scheduled  

## 2016-06-02 NOTE — Telephone Encounter (Signed)
Pt would like to have blood work done due to him working out.

## 2016-06-10 ENCOUNTER — Encounter: Payer: Self-pay | Admitting: Internal Medicine

## 2016-06-10 ENCOUNTER — Ambulatory Visit (INDEPENDENT_AMBULATORY_CARE_PROVIDER_SITE_OTHER): Payer: Medicare Other | Admitting: Internal Medicine

## 2016-06-10 VITALS — BP 100/70 | HR 91 | Temp 97.7°F | Resp 20 | Ht 67.0 in | Wt 176.4 lb

## 2016-06-10 DIAGNOSIS — I712 Thoracic aortic aneurysm, without rupture, unspecified: Secondary | ICD-10-CM

## 2016-06-10 DIAGNOSIS — M609 Myositis, unspecified: Secondary | ICD-10-CM | POA: Diagnosis not present

## 2016-06-10 DIAGNOSIS — E039 Hypothyroidism, unspecified: Secondary | ICD-10-CM

## 2016-06-10 NOTE — Progress Notes (Signed)
Pre visit review using our clinic review tool, if applicable. No additional management support is needed unless otherwise documented below in the visit note. 

## 2016-06-10 NOTE — Progress Notes (Signed)
Subjective:    Patient ID: Andrew Jennings, male    DOB: February 19, 1961, 55 y.o.   MRN: [id number]  HPI  55 year old patient who has a history of hypothyroidism.  He also has a history of schizophrenia and bipolar disorder and is followed by psychiatry.  While in [place] he was hospitalized overnight with myositis with a CPK elevated at 8000.  This was associated apparently with exercise and dehydration.  He has been very concerned about recurrent rhabdomyolysis and has had a number of CPK levels checked over the years that have been essentially normal.  Is requesting a repeat CPK today.  He had a normal exercise routine at his health club earlier today  Past Medical History:  Diagnosis Date  . AAA (abdominal aortic aneurysm) ([place])   . Adenoma of large intestine   . Allergy   . Arrhythmia   . Arthritis   . Depression   . Glaucoma   . Headache(784.0)   . Hemorrhoids   . Hyperlipidemia   . Hypothyroid   . Intestinal polyps   . 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 Schizophrenia ([place])   . Sleep apnea   . Terminal ileitis [facility])      Social History   Social History  . Marital status: Married    Spouse name: N/A  . Number of children: N/A  . Years of education: N/A   Occupational History  . Not on file.   Social History Main Topics  . Smoking status: Former Smoker    Types: Cigarettes    Quit date: 05/02/2009  . Smokeless tobacco: Never Used  . Alcohol use Yes     Comment: occasional  . Drug use: No  . Sexual activity: Not on file   Other Topics Concern  . Not on file   Social History Narrative  . No narrative on file    Past Surgical History:  Procedure Laterality Date  . COLONOSCOPY    . NECK SURGERY     BLOOD CLOT IN NECK  . UMBILICAL HERNIA REPAIR      Family History  Problem Relation Age of Onset  . Arthritis Mother   . Hyperlipidemia Mother   . Heart disease Mother   . Arthritis Father   . Colon cancer Neg Hx     Allergies  Allergen  Reactions  . Darvocet [Propoxyphene N-Acetaminophen] Other (See Comments)    "Makes me feel strange"..Per pt   . Penicillins     Makes sick, per pt     Current Outpatient Prescriptions on File Prior to Visit  Medication Sig Dispense Refill  . aspirin 81 MG tablet Take 81 mg by mouth daily.    . diphenhydrAMINE (BENADRYL) 50 MG capsule Take 50 mg by mouth every 6 (six) hours as needed for itching or allergies.     . haloperidol (HALDOL) 10 MG tablet Take 30 mg by mouth daily.    . haloperidol (HALDOL) 5 MG tablet Take 5 mg by mouth daily.    Marland Kitchen levothyroxine (SYNTHROID, LEVOTHROID) 150 MCG tablet TAKE 1 TABLET (150 MCG TOTAL) BY MOUTH DAILY. 90 tablet 3  . rosuvastatin (CRESTOR) 10 MG tablet Take 1 tablet (10 mg total) by mouth daily. 90 tablet 3  . sildenafil (REVATIO) 20 MG tablet TAKE ONE TO FIVE TABLETS BY MOUTH AS NEEDED 20 tablet 2   No current facility-administered medications on file prior to visit.     BP 100/70 (BP Location: Left Arm,  Patient Position: Sitting, Cuff Size: Normal)   Pulse 91   Temp 97.7 F (36.5 C) (Oral)   Resp 20   Ht 5\' 7"  (1.702 m)   Wt 176 lb 6.1 oz (80 kg)   SpO2 98%   BMI 27.63 kg/m     Review of Systems  Constitutional: Negative.   Genitourinary:       Denies dark urine  Musculoskeletal: Negative for myalgias.       Objective:   Physical Exam  Constitutional: He is oriented to person, place, and time. He appears well-developed and well-nourished. No distress.  Blood pressure low normal  HENT:  Head: Normocephalic.  Right Ear: External ear normal.  Left Ear: External ear normal.  Eyes: Conjunctivae and EOM are normal.  Neck: Normal range of motion.  Cardiovascular: Normal rate and normal heart sounds.   Pulmonary/Chest: Breath sounds normal.  Abdominal: Bowel sounds are normal.  Musculoskeletal: Normal range of motion. He exhibits no edema or tenderness.  Neurological: He is alert and oriented to person, place, and time.    Psychiatric: He has a normal mood and affect. His behavior is normal.          Assessment & Plan:   Hypothyroidism.  Compliance with his medications.  Stressed  He still is missing several dosages each month History of rhabdomyolysis.  This was discussed at length.  Warning signs discussed.  The patient was told this is likely a one time event and not to be repeated in the future.  Serial CPK levels not medically necessary.  Signs and symptoms of myositis.  Discussed  Follow-up 6 months  Khyren Hing Pilar Plate

## 2016-06-10 NOTE — Patient Instructions (Signed)
It is important that you exercise regularly, at least 20 minutes 3 to 4 times per week.  If you develop chest pain or shortness of breath seek  medical attention.  Return in 6 months for follow-up  

## 2016-06-11 ENCOUNTER — Telehealth: Payer: Self-pay | Admitting: Internal Medicine

## 2016-06-11 LAB — CK: CK TOTAL: 138 U/L (ref 7–232)

## 2016-06-11 NOTE — Telephone Encounter (Signed)
Pt calling again to get lab result and was very rude (using language that is inappropriate-profanity) b/c no one has called him with the results.

## 2016-06-11 NOTE — Telephone Encounter (Signed)
Pt would like blood work result  °

## 2016-06-12 NOTE — Telephone Encounter (Signed)
Pt aware of results, see result note. 

## 2016-06-18 ENCOUNTER — Other Ambulatory Visit: Payer: Self-pay | Admitting: Family Medicine

## 2016-07-17 ENCOUNTER — Encounter: Payer: Self-pay | Admitting: Adult Health

## 2016-07-17 ENCOUNTER — Ambulatory Visit (INDEPENDENT_AMBULATORY_CARE_PROVIDER_SITE_OTHER): Payer: Medicare Other | Admitting: Adult Health

## 2016-07-17 VITALS — BP 132/70 | Temp 97.4°F | Ht 67.0 in | Wt 177.2 lb

## 2016-07-17 DIAGNOSIS — R109 Unspecified abdominal pain: Secondary | ICD-10-CM | POA: Diagnosis not present

## 2016-07-17 LAB — POCT URINALYSIS DIPSTICK
Bilirubin, UA: NEGATIVE
GLUCOSE UA: NEGATIVE
Ketones, UA: NEGATIVE
Leukocytes, UA: NEGATIVE
NITRITE UA: NEGATIVE
PH UA: 6
Protein, UA: NEGATIVE
RBC UA: NEGATIVE
Spec Grav, UA: 1.025
UROBILINOGEN UA: NEGATIVE

## 2016-07-17 LAB — BASIC METABOLIC PANEL
BUN: 16 mg/dL (ref 6–23)
CO2: 32 meq/L (ref 19–32)
Calcium: 9.6 mg/dL (ref 8.4–10.5)
Chloride: 103 mEq/L (ref 96–112)
Creatinine, Ser: 1.09 mg/dL (ref 0.40–1.50)
GFR: 73.91 mL/min (ref >60.00)
Glucose, Bld: 90 mg/dL (ref 70–99)
Potassium: 4.3 mEq/L (ref 3.5–5.1)
SODIUM: 140 meq/L (ref 135–145)

## 2016-07-17 NOTE — Progress Notes (Signed)
Subjective:    Patient ID: Andrew Jennings, male    DOB: March 27, 1961, 56 y.o.   MRN: [id number]  HPI  56 year old male, patient of Dr. Raliegh Ip  who presents to the office today for 2 days of left flank pain. He reports no trauma to the area nor does he know any reason why his flank started hurting. He is worried that it " may be my kidney." He denies any history of kidney stones. He denies any abdominal pain, n/v/d or feeling ill.   Denies any changes in urination and denies any UTI symptoms.   He is unable to describe the pain. Pain is not worse with certain movement   Review of Systems  Constitutional: Negative.   Gastrointestinal: Negative.   Genitourinary: Negative.   Musculoskeletal: Positive for back pain.  Skin: Negative.   Neurological: Negative.   All other systems reviewed and are negative.  Past Medical History:  Diagnosis Date  . AAA (abdominal aortic aneurysm) ([place])   . Adenoma of large intestine   . Allergy   . Arrhythmia   . Arthritis   . Depression   . Glaucoma   . Headache(784.0)   . Hemorrhoids   . Hyperlipidemia   . Hypothyroid   . Intestinal polyps   . 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 Schizophrenia ([place])   . Sleep apnea   . Terminal ileitis [facility])     Social History   Social History  . Marital status: Married    Spouse name: N/A  . Number of children: N/A  . Years of education: N/A   Occupational History  . Not on file.   Social History Main Topics  . Smoking status: Former Smoker    Types: Cigarettes    Quit date: 05/02/2009  . Smokeless tobacco: Never Used  . Alcohol use Yes     Comment: occasional  . Drug use: No  . Sexual activity: Not on file   Other Topics Concern  . Not on file   Social History Narrative  . No narrative on file    Past Surgical History:  Procedure Laterality Date  . COLONOSCOPY    . NECK SURGERY     BLOOD CLOT IN NECK  . UMBILICAL HERNIA REPAIR      Family History  Problem Relation Age of  Onset  . Arthritis Mother   . Hyperlipidemia Mother   . Heart disease Mother   . Arthritis Father   . Colon cancer Neg Hx     Allergies  Allergen Reactions  . Darvocet [Propoxyphene N-Acetaminophen] Other (See Comments)    "Makes me feel strange"..Per pt   . Penicillins     Makes sick, per pt     Current Outpatient Prescriptions on File Prior to Visit  Medication Sig Dispense Refill  . aspirin 81 MG tablet Take 81 mg by mouth daily.    . diphenhydrAMINE (BENADRYL) 50 MG capsule Take 50 mg by mouth every 6 (six) hours as needed for itching or allergies.     . haloperidol (HALDOL) 10 MG tablet Take 30 mg by mouth daily.    . haloperidol (HALDOL) 5 MG tablet Take 5 mg by mouth daily.    Marland Kitchen levothyroxine (SYNTHROID, LEVOTHROID) 150 MCG tablet TAKE 1 TABLET (150 MCG TOTAL) BY MOUTH DAILY. 90 tablet 3  . rosuvastatin (CRESTOR) 10 MG tablet Take 1 tablet (10 mg total) by mouth daily. 90 tablet 3  . sildenafil (REVATIO)  20 MG tablet TAKE ONE TO FIVE BY MOUTH AS NEEDED 20 tablet 1   No current facility-administered medications on file prior to visit.     BP 132/70   Temp 97.4 F (36.3 C) (Oral)   Ht 5\' 7"  (1.702 m)   Wt 177 lb 3.2 oz (80.4 kg)   BMI 27.75 kg/m       Objective:   Physical Exam  Constitutional: He is oriented to person, place, and time. He appears well-developed and well-nourished. No distress.  Cardiovascular: Normal rate, regular rhythm, normal heart sounds and intact distal pulses.  Exam reveals no friction rub.   No murmur heard. Pulmonary/Chest: Effort normal and breath sounds normal. No respiratory distress. He has no wheezes. He has no rales. He exhibits no tenderness.  Abdominal: Soft. Normal appearance, normal aorta and bowel sounds are normal. He exhibits no distension. There is no tenderness. There is CVA tenderness (left sided). There is no rebound and no guarding.    Musculoskeletal: He exhibits no edema or tenderness.  Pain not reproducible with  palpation   Neurological: He is alert and oriented to person, place, and time. He has normal reflexes. He displays normal reflexes. No cranial nerve deficit. He exhibits normal muscle tone. Coordination normal.  Skin: Skin is warm and dry. No rash noted. He is not diaphoretic. No erythema.  Psychiatric: He has a normal mood and affect. His behavior is normal. Judgment and thought content normal.  Nursing note and vitals reviewed.     Assessment & Plan:  1. Left flank pain - Unknown cause of pain. Possible kidney stone? - Basic metabolic panel; Future - DG Abd 1 View; Future - POC Urinalysis Dipstick- Negative   Dorothyann Peng, NP

## 2016-07-18 ENCOUNTER — Telehealth: Payer: Self-pay | Admitting: Internal Medicine

## 2016-07-18 ENCOUNTER — Other Ambulatory Visit (INDEPENDENT_AMBULATORY_CARE_PROVIDER_SITE_OTHER): Payer: Medicare Other

## 2016-07-18 ENCOUNTER — Ambulatory Visit (INDEPENDENT_AMBULATORY_CARE_PROVIDER_SITE_OTHER)
Admission: RE | Admit: 2016-07-18 | Discharge: 2016-07-18 | Disposition: A | Discharge status: Home / Self Care | Payer: Medicare Other | Source: Ambulatory Visit | Attending: Adult Health | Admitting: Adult Health

## 2016-07-18 DIAGNOSIS — R109 Unspecified abdominal pain: Secondary | ICD-10-CM | POA: Diagnosis not present

## 2016-07-18 LAB — CK: CK TOTAL: 109 U/L (ref 7–232)

## 2016-07-18 NOTE — Telephone Encounter (Signed)
Patient was very angry on the phone with me - he is wondering about a CPK lab, and why it wasn't done. Did you, or can we order one of these?

## 2016-07-18 NOTE — Telephone Encounter (Signed)
Pt would like results of labs. Please call back. °

## 2016-07-18 NOTE — Telephone Encounter (Signed)
Updated Cheyne on his x ray and labs

## 2016-08-15 ENCOUNTER — Other Ambulatory Visit: Payer: Self-pay | Admitting: Internal Medicine

## 2016-08-22 ENCOUNTER — Telehealth: Payer: Self-pay | Admitting: Internal Medicine

## 2016-08-22 NOTE — Telephone Encounter (Signed)
Pt would like to know results of chest xray. Would like to know specifically if he has cancer. Tommi Rumps is out today and Monday.

## 2016-08-25 NOTE — Telephone Encounter (Signed)
Pt following up on chest xray results. Would like to know if Dr Raliegh Ip can look at that xray, since cory is out

## 2016-08-25 NOTE — Telephone Encounter (Signed)
See message below, please advise.

## 2016-08-25 NOTE — Telephone Encounter (Signed)
Notify patient that x-ray on January 5 was normal

## 2016-08-25 NOTE — Telephone Encounter (Signed)
Pt very irate with Nurse, using profanity for calling "this number" nurse called [phone]. Pt stated that he was very specific that we are not allowed to call this number. Nurse attempted to tell pt that I could call him back at the other number listed on his chart.  Pt yelled louder and demanded his lab results. Pt was informed that the x-ray result were normal form 07/18/16.

## 2016-09-15 ENCOUNTER — Other Ambulatory Visit: Payer: Self-pay | Admitting: Internal Medicine

## 2016-10-03 ENCOUNTER — Telehealth: Payer: Self-pay | Admitting: Internal Medicine

## 2016-10-03 NOTE — Telephone Encounter (Signed)
Pt would like to see if Dr. Raliegh Ip will call in a Vitamin B to his drugstore he is not sure what kind to get

## 2016-10-06 DIAGNOSIS — Z8521 Personal history of malignant neoplasm of larynx: Secondary | ICD-10-CM | POA: Insufficient documentation

## 2016-10-06 DIAGNOSIS — M542 Cervicalgia: Secondary | ICD-10-CM | POA: Diagnosis not present

## 2016-10-06 MED ORDER — VITAMIN B 12 100 MCG PO LOZG: 1000.0000 mg | LOZENGE | Freq: Every day | ORAL | 2 refills | Status: DC

## 2016-10-06 NOTE — Telephone Encounter (Signed)
New Rx ws sent to pts pharmacy

## 2016-10-08 ENCOUNTER — Encounter: Payer: Self-pay | Admitting: Internal Medicine

## 2016-10-09 ENCOUNTER — Other Ambulatory Visit: Payer: Self-pay | Admitting: Internal Medicine

## 2016-10-15 ENCOUNTER — Other Ambulatory Visit: Payer: Self-pay | Admitting: Internal Medicine

## 2016-10-30 ENCOUNTER — Other Ambulatory Visit: Payer: Self-pay | Admitting: Internal Medicine

## 2016-11-11 ENCOUNTER — Encounter: Payer: Self-pay | Admitting: Internal Medicine

## 2016-11-12 ENCOUNTER — Other Ambulatory Visit: Payer: Self-pay | Admitting: Internal Medicine

## 2016-11-26 ENCOUNTER — Other Ambulatory Visit: Payer: Self-pay | Admitting: Internal Medicine

## 2016-12-05 ENCOUNTER — Encounter: Payer: Medicare Other | Admitting: Internal Medicine

## 2016-12-10 ENCOUNTER — Telehealth: Payer: Self-pay | Admitting: Internal Medicine

## 2016-12-10 NOTE — Telephone Encounter (Signed)
Pt is aware md out of office until June 1

## 2016-12-10 NOTE — Telephone Encounter (Signed)
Please see message below, please advise 

## 2016-12-10 NOTE — Telephone Encounter (Signed)
Pt wants to know if he can take sildenafil (REVATIO) 20 MG tablet With his regular meds and aspirin

## 2016-12-10 NOTE — Telephone Encounter (Signed)
Pt calling to check the status of the above msg. °

## 2016-12-11 NOTE — Telephone Encounter (Signed)
Patient returned call to office requesting to speak admin staff, attempted to discuss concerns with patient; however patient was irate and nurse unable to understand what patient was saying; requested clarification from patient multiple times and patient hung up; site administrator attempted to contact patient back; no answer.

## 2016-12-12 ENCOUNTER — Other Ambulatory Visit: Payer: Self-pay | Admitting: Internal Medicine

## 2016-12-12 NOTE — Telephone Encounter (Signed)
Yes, that medication is okay for patient to take

## 2016-12-12 NOTE — Telephone Encounter (Signed)
Spoke with pt and advised. Nothing further needed at this time.  ° °

## 2016-12-12 NOTE — Telephone Encounter (Signed)
Pt would like nurse to return his call

## 2016-12-15 ENCOUNTER — Encounter: Payer: Medicare Other | Admitting: Internal Medicine

## 2016-12-16 NOTE — Telephone Encounter (Signed)
Pt needs a refill on sildenafil

## 2016-12-30 ENCOUNTER — Encounter: Payer: Self-pay | Admitting: Internal Medicine

## 2016-12-30 ENCOUNTER — Ambulatory Visit (INDEPENDENT_AMBULATORY_CARE_PROVIDER_SITE_OTHER): Payer: Medicare Other | Admitting: Internal Medicine

## 2016-12-30 VITALS — BP 102/58 | HR 58 | Temp 97.6°F | Ht 67.0 in | Wt 172.6 lb

## 2016-12-30 DIAGNOSIS — I712 Thoracic aortic aneurysm, without rupture, unspecified: Secondary | ICD-10-CM

## 2016-12-30 DIAGNOSIS — E7800 Pure hypercholesterolemia, unspecified: Secondary | ICD-10-CM | POA: Diagnosis not present

## 2016-12-30 DIAGNOSIS — E034 Atrophy of thyroid (acquired): Secondary | ICD-10-CM

## 2016-12-30 DIAGNOSIS — Z0000 Encounter for general adult medical examination without abnormal findings: Secondary | ICD-10-CM | POA: Diagnosis not present

## 2016-12-30 LAB — LIPID PANEL
CHOLESTEROL: 179 mg/dL (ref 0–200)
HDL: 34.8 mg/dL — AB (ref >39.00)
NonHDL: 144.45
TRIGLYCERIDES: 263 mg/dL — AB (ref 0.0–149.0)
Total CHOL/HDL Ratio: 5
VLDL: 52.6 mg/dL — AB (ref 0.0–40.0)

## 2016-12-30 LAB — LDL CHOLESTEROL, DIRECT: Direct LDL: 99 mg/dL

## 2016-12-30 LAB — CK: CK TOTAL: 76 U/L (ref 7–232)

## 2016-12-30 LAB — TSH: TSH: 0.22 u[IU]/mL — AB (ref 0.35–4.50)

## 2016-12-30 NOTE — Patient Instructions (Signed)
Limit your sodium (Salt) intake    It is important that you exercise regularly, at least 20 minutes 3 to 4 times per week.  If you develop chest pain or shortness of breath seek  medical attention.  Return in one year for follow-up   

## 2016-12-30 NOTE — Addendum Note (Signed)
Addended by: Elmer Picker on: 12/30/2016 01:51 PM   Modules accepted: Orders

## 2016-12-30 NOTE — Progress Notes (Signed)
Subjective:    Patient ID: Andrew Jennings, male    DOB: 1960/10/28, 56 y.o.   MRN: [id number]  HPI  56 year old patient who is seen today for a preventive health examination He is followed by psychiatrist with a history of schizophrenia as well as bipolar disorder. He is also followed by cardiology with a history of a thoracic artery aneurysm.  This has been relatively stable.  He has been evaluated recently by ENT.  Does have a history of laryngeal cancer No concerns or complaints  Past Medical History:  Diagnosis Date  . AAA (abdominal aortic aneurysm) ([place])   . Adenoma of large intestine   . Allergy   . Arrhythmia   . Arthritis   . Depression   . Glaucoma   . Headache(784.0)   . Hemorrhoids   . Hyperlipidemia   . Hypothyroid   . Intestinal polyps   . 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 Schizophrenia ([place])   . Sleep apnea   . Terminal ileitis [facility])      Social History   Social History  . Marital status: Married    Spouse name: N/A  . Number of children: N/A  . Years of education: N/A   Occupational History  . Not on file.   Social History Main Topics  . Smoking status: Former Smoker    Types: Cigarettes    Quit date: 05/02/2009  . Smokeless tobacco: Never Used  . Alcohol use Yes     Comment: occasional  . Drug use: No  . Sexual activity: Not on file   Other Topics Concern  . Not on file   Social History Narrative  . No narrative on file    Past Surgical History:  Procedure Laterality Date  . COLONOSCOPY    . NECK SURGERY     BLOOD CLOT IN NECK  . UMBILICAL HERNIA REPAIR      Family History  Problem Relation Age of Onset  . Arthritis Mother   . Hyperlipidemia Mother   . Heart disease Mother   . Arthritis Father   . Colon cancer Neg Hx     Allergies  Allergen Reactions  . Darvocet [Propoxyphene N-Acetaminophen] Other (See Comments)    "Makes me feel strange"..Per pt   . Penicillins     Makes sick, per pt     Current  Outpatient Prescriptions on File Prior to Visit  Medication Sig Dispense Refill  . aspirin 81 MG tablet Take 81 mg by mouth daily.    . Cyanocobalamin (VITAMIN B 12) 100 MCG LOZG Take 1,000 mg by mouth daily. 100 lozenge 2  . diphenhydrAMINE (BENADRYL) 50 MG capsule Take 50 mg by mouth every 6 (six) hours as needed for itching or allergies.     . haloperidol (HALDOL) 10 MG tablet Take 30 mg by mouth daily.    . haloperidol (HALDOL) 5 MG tablet Take 5 mg by mouth daily.    Marland Kitchen levothyroxine (SYNTHROID, LEVOTHROID) 150 MCG tablet TAKE 1 TABLET (150 MCG TOTAL) BY MOUTH DAILY. 90 tablet 3  . sildenafil (REVATIO) 20 MG tablet TAKE 1 TO 5 TABLETS BY MOUTH DAILY AS NEEDED 20 tablet 0  . rosuvastatin (CRESTOR) 10 MG tablet Take 1 tablet (10 mg total) by mouth daily. 90 tablet 3   No current facility-administered medications on file prior to visit.     BP (!) 102/58 (BP Location: Left Arm, Patient Position: Sitting, Cuff Size: Normal)   Pulse Marland Kitchen)  58   Temp 97.6 F (36.4 C) (Oral)   Ht 5\' 7"  (1.702 m)   Wt 172 lb 9.6 oz (78.3 kg)   SpO2 98%   BMI 27.03 kg/m   Medicare wellness visit  1. Risk factors, based on past  M,S,F history- cardiovascular risk factors include his lipidemia.  He has remote history tobacco use but not in over 15 years.  He has PAD with a history of a thoracic aortic artery aneurysm.  2.  Physical activities:no limitations.  Does participated in activities at his health club  3.  Depression/mood:followed by psychiatry with bipolar depression and also a history of schizophrenia  4.  Hearing:no deficits  5.  ADL's:independent  6.  Fall risk:low  7.  Home safety:no problems identified  8.  Height weight, and visual acuity;height and weight stable no change in visual acuity  9.  Counseling:follow-up psychiatry  10. Lab orders based on risk factors:laboratory studies reviewed  11. Referral : follow-up colonoscopy scheduled this summer due to a history of colonic  polyps and history of ulcerative colitis  12. Care plan:continue efforts at aggressive risk factor modification  13. Cognitive assessment: alert in order with normal affect no cognitive dysfunction  14. Screening: Patient provided with a written and personalized 5-10 year screening schedule in the AVS.    15. Provider List Update: cardiology GI primary care and psychiatry  16.  Advance directives-  Patient encouraged to make arrangements for a living will as well as health care power of attorney    Review of Systems  Constitutional: Negative for appetite change, chills, fatigue and fever.  HENT: Negative for congestion, dental problem, ear pain, hearing loss, sore throat, tinnitus, trouble swallowing and voice change.   Eyes: Negative for pain, discharge and visual disturbance.  Respiratory: Negative for cough, chest tightness, wheezing and stridor.   Cardiovascular: Negative for chest pain, palpitations and leg swelling.  Gastrointestinal: Negative for abdominal distention, abdominal pain, blood in stool, constipation, diarrhea, nausea and vomiting.  Genitourinary: Negative for difficulty urinating, discharge, flank pain, genital sores, hematuria and urgency.  Musculoskeletal: Negative for arthralgias, back pain, gait problem, joint swelling, myalgias and neck stiffness.  Skin: Negative for rash.  Neurological: Negative for dizziness, syncope, speech difficulty, weakness, numbness and headaches.  Hematological: Negative for adenopathy. Does not bruise/bleed easily.  Psychiatric/Behavioral: Negative for behavioral problems and dysphoric mood. The patient is not nervous/anxious.        Objective:   Physical Exam  Constitutional: He appears well-developed and well-nourished.  Blood pressure low normal and symmetrical  HENT:  Head: Normocephalic and atraumatic.  Right Ear: External ear normal.  Left Ear: External ear normal.  Nose: Nose normal.  Mouth/Throat: Oropharynx is clear  and moist.  Multiple missing dentition  Eyes: Conjunctivae and EOM are normal. Pupils are equal, round, and reactive to light. No scleral icterus.  Neck: Normal range of motion. Neck supple. No JVD present. No thyromegaly present.  Cardiovascular: Regular rhythm, normal heart sounds and intact distal pulses.  Exam reveals no gallop and no friction rub.   No murmur heard. Pulmonary/Chest: Effort normal and breath sounds normal. He exhibits no tenderness.  Abdominal: Soft. Bowel sounds are normal. He exhibits no distension and no mass. There is no tenderness.  Genitourinary: Penis normal.  Musculoskeletal: Normal range of motion. He exhibits no edema or tenderness.  Lymphadenopathy:    He has no cervical adenopathy.  Neurological: He is alert. He has normal reflexes. No cranial nerve deficit. Coordination normal.  Skin: Skin is warm and dry. No rash noted.  Psychiatric: He has a normal mood and affect. His behavior is normal.          Assessment & Plan:   Preventive health examination Medicare wellness visit  History of thoracic aortic aneurysm.  Patient scheduled for follow-up chest CT later this year Schizophrenia.  Follow-up psychiatry Colonic polyps.  Patient is scheduled for follow-up colonoscopy in 2 months Hypothyroidism  Follow-up here in one year Laboratory studies reviewed  [name]

## 2017-01-02 ENCOUNTER — Telehealth: Payer: Self-pay | Admitting: Internal Medicine

## 2017-01-02 NOTE — Telephone Encounter (Signed)
Spoke with pt and informed him that lab results are normal. Cholesterol is 179 CPK is normal at 76 Pt verbalized understanding

## 2017-01-02 NOTE — Telephone Encounter (Signed)
Please call/notify patient that lab/test/procedure is normal  Cholesterol is 179 CPK is normal at 76

## 2017-01-02 NOTE — Telephone Encounter (Signed)
Pt calling again for results.   Dr. Raliegh Ip - Please advise. Thanks!

## 2017-01-02 NOTE — Telephone Encounter (Signed)
Pt would like blood work result  °

## 2017-01-05 ENCOUNTER — Other Ambulatory Visit: Payer: Self-pay | Admitting: Internal Medicine

## 2017-01-05 ENCOUNTER — Telehealth: Payer: Self-pay | Admitting: Internal Medicine

## 2017-01-05 NOTE — Telephone Encounter (Signed)
Pt would like you to know his preferred pharmacy is now  [facility] [address], [place]

## 2017-01-06 NOTE — Telephone Encounter (Signed)
Pt's chart was updated

## 2017-01-08 ENCOUNTER — Ambulatory Visit (AMBULATORY_SURGERY_CENTER): Payer: Self-pay

## 2017-01-08 ENCOUNTER — Telehealth: Payer: Self-pay

## 2017-01-08 ENCOUNTER — Encounter: Payer: Self-pay | Admitting: Internal Medicine

## 2017-01-08 VITALS — Ht 67.0 in | Wt 165.0 lb

## 2017-01-08 DIAGNOSIS — Z860100 Personal history of colon polyps, unspecified: Secondary | ICD-10-CM

## 2017-01-08 NOTE — Telephone Encounter (Signed)
Patient was just seen by pre visit nurse and is a little confused regarding his prep instructions. Patient is requesting to speak to a nurse. Best # [phone]

## 2017-01-08 NOTE — Telephone Encounter (Signed)
Andrew Jennings,  Thanks for the heads up   [employer]

## 2017-01-08 NOTE — Telephone Encounter (Signed)
Andrew Jennings and Dr Henrene Pastor,      Pt very anxious and wanted to be sure you were aware of a previous experience he had.  "I woke up gasping for air and in pain".  Reviewed properties of Propofol in an attempt to calm pt.  Just FYI. Thank you, Cammie Faulstich/PV

## 2017-01-08 NOTE — Telephone Encounter (Signed)
Called pt back and reviewed instructions again. Andrew Jennings/PV

## 2017-01-12 ENCOUNTER — Telehealth: Payer: Self-pay | Admitting: Internal Medicine

## 2017-01-12 MED ORDER — SILDENAFIL CITRATE 20 MG PO TABS: ORAL_TABLET | ORAL | 0 refills | Status: DC

## 2017-01-12 NOTE — Telephone Encounter (Signed)
Medication refilled

## 2017-01-12 NOTE — Telephone Encounter (Signed)
° ° ° °  Pt call to ask if the below rx can be sent to   [name]    sildenafil (REVATIO) 20 MG tablet

## 2017-01-12 NOTE — Telephone Encounter (Signed)
Pt request refill  sildenafil (REVATIO) 20 MG tablet [id number] - [place], [place] - [address]

## 2017-01-12 NOTE — Telephone Encounter (Signed)
Medication was sent to pharmacy as requested by pt.

## 2017-01-19 ENCOUNTER — Telehealth: Payer: Self-pay | Admitting: Internal Medicine

## 2017-01-19 NOTE — Telephone Encounter (Signed)
Reviewed instructions in detail with patient.  Instructed pt to call back if he has any further questions

## 2017-01-19 NOTE — Telephone Encounter (Signed)
Pt calling wanting to know what he can have to eat tonight. Discussed with pt that he could have what he wanted tonight as long as he did not have nuts, seeds, popcorn, corn, beans, peas, salads, or raw vegetables. Pt states he did not understand his instructions. Let him know he could eat what he wanted except what is listed below today and tomorrow. Reviewed with him that he had to have clear liquids starting Wednesday and the prep, no food. Pt acted like he was hearing this for the first time. Pt had previsit at the end of June. Pt wanted to know if he could eat the day of the procedure. Discussed with pt that he could have clear liquids per his instructions but no food until after the procedure.

## 2017-01-19 NOTE — Telephone Encounter (Signed)
Patient does not understand any instructions from previsit for colon. Please call and discuss with pt.

## 2017-01-20 ENCOUNTER — Telehealth: Payer: Self-pay | Admitting: Internal Medicine

## 2017-01-20 NOTE — Telephone Encounter (Signed)
Called pt and reviewed prep instructions with pt. He was asking a lot questions and did not understand the prep and was very difficult. Reviewed instructions and answered questions. Pt states he is calling back in the am to ask more questions.

## 2017-01-21 ENCOUNTER — Telehealth: Payer: Self-pay | Admitting: *Deleted

## 2017-01-21 NOTE — Telephone Encounter (Signed)
Patient called back again. He was asking about how to drink the Suprep for his colonoscopy tomorrow. Scharlene Gloss spoke with patient and explained the instructions. Pt verbalizes understanding.

## 2017-01-21 NOTE — Telephone Encounter (Signed)
Phone call was sent directly to admitting by Judi Riling at front desk.  Patient was wanting to receive his instructions on what to do today for tomorrow's colonoscopy.  He is bipolar and insisted that he speak to a nurse as I was giving instructions to him.  Gerrit Halls, RN was given the call and she went over the prep instructions again today.  Advised patient to call back if he had anymore questions regarding his prep.

## 2017-01-22 ENCOUNTER — Ambulatory Visit (AMBULATORY_SURGERY_CENTER): Payer: Medicare Other | Admitting: Internal Medicine

## 2017-01-22 ENCOUNTER — Encounter: Payer: Self-pay | Admitting: Internal Medicine

## 2017-01-22 ENCOUNTER — Telehealth: Payer: Self-pay | Admitting: Internal Medicine

## 2017-01-22 ENCOUNTER — Telehealth: Payer: Self-pay | Admitting: *Deleted

## 2017-01-22 VITALS — BP 103/62 | HR 76 | Temp 98.2°F | Resp 12 | Ht 67.0 in | Wt 165.0 lb

## 2017-01-22 DIAGNOSIS — G4733 Obstructive sleep apnea (adult) (pediatric): Secondary | ICD-10-CM | POA: Diagnosis not present

## 2017-01-22 DIAGNOSIS — Z86010 Personal history of colonic polyps: Secondary | ICD-10-CM

## 2017-01-22 MED ORDER — SODIUM CHLORIDE 0.9 % IV SOLN: 500.0000 mL | INTRAVENOUS | Status: DC

## 2017-01-22 NOTE — Progress Notes (Signed)
Pt's states no medical or surgical changes since previsit or office visit. 

## 2017-01-22 NOTE — Op Note (Signed)
[place] Patient Name: Andrew Jennings Procedure Date: 01/22/2017 2:13 PM MRN: [id number] Endoscopist: Docia Chuck. Henrene Pastor , MD Age: 56 Referring MD:  Date of Birth: Feb 24, 1961 Gender: Male Account #: [id number] Procedure:                Colonoscopy Indications:              High risk colon cancer surveillance: Personal                            history of non-advanced adenoma. Index exam in [place]. Follow-up exam here 2011 was negative.                            Also questionable history of IVDA in the past Medicines:                Monitored Anesthesia Care Procedure:                Pre-Anesthesia Assessment:                           - Prior to the procedure, a History and Physical                            was performed, and patient medications and                            allergies were reviewed. The patient's tolerance of                            previous anesthesia was also reviewed. The risks                            and benefits of the procedure and the sedation                            options and risks were discussed with the patient.                            All questions were answered, and informed consent                            was obtained. Prior Anticoagulants: The patient has                            taken no previous anticoagulant or antiplatelet                            agents. ASA Grade Assessment: II - A patient with                            mild systemic disease. After reviewing the risks  and benefits, the patient was deemed in                            satisfactory condition to undergo the procedure.                           After obtaining informed consent, the colonoscope                            was passed under direct vision. Throughout the                            procedure, the patient's blood pressure, pulse, and                            oxygen saturations  were monitored continuously. The                            Colonoscope was introduced through the anus and                            advanced to the the cecum, identified by                            appendiceal orifice and ileocecal valve. The                            ileocecal valve, appendiceal orifice, and rectum                            were photographed. The quality of the bowel                            preparation was excellent. The colonoscopy was                            performed without difficulty. The patient tolerated                            the procedure well. The bowel preparation used was                            SUPREP. Scope In: 2:18:26 PM Scope Out: 2:28:41 PM Scope Withdrawal Time: 0 hours 8 minutes 58 seconds  Total Procedure Duration: 0 hours 10 minutes 15 seconds  Findings:                 A few diverticula were found in the sigmoid colon.                           The exam was otherwise without abnormality on                            direct and retroflexion views. Internal hemorrhoids  present. Complications:            No immediate complications. Estimated blood loss:                            None. Estimated Blood Loss:     Estimated blood loss: none. Impression:               - Diverticulosis in the sigmoid colon.                           - The examination was otherwise normal on direct                            and retroflexion views.                           - No specimens collected. Recommendation:           - Repeat colonoscopy in 10 years for surveillance.                           - Patient has a contact number available for                            emergencies. The signs and symptoms of potential                            delayed complications were discussed with the                            patient. Return to normal activities tomorrow.                            Written discharge instructions were  provided to the                            patient.                           - Resume previous diet.                           - Continue present medications. Docia Chuck. Henrene Pastor, MD 01/22/2017 2:33:57 PM This report has been signed electronically.

## 2017-01-22 NOTE — Progress Notes (Signed)
Report to PACU, RN, vss, BBS= Clear.  

## 2017-01-22 NOTE — Patient Instructions (Signed)
YOU HAD AN ENDOSCOPIC PROCEDURE TODAY AT THE [facility]:   Refer to the procedure report that was given to you for any specific questions about what was found during the examination.  If the procedure report does not answer your questions, please call your gastroenterologist to clarify.  If you requested that your care partner not be given the details of your procedure findings, then the procedure report has been included in a sealed envelope for you to review at your convenience later.  YOU SHOULD EXPECT: Some feelings of bloating in the abdomen. Passage of more gas than usual.  Walking can help get rid of the air that was put into your GI tract during the procedure and reduce the bloating. If you had a lower endoscopy (such as a colonoscopy or flexible sigmoidoscopy) you may notice spotting of blood in your stool or on the toilet paper. If you underwent a bowel prep for your procedure, you may not have a normal bowel movement for a few days.  Please Note:  You might notice some irritation and congestion in your nose or some drainage.  This is from the oxygen used during your procedure.  There is no need for concern and it should clear up in a day or so.  SYMPTOMS TO REPORT IMMEDIATELY:   Following lower endoscopy (colonoscopy or flexible sigmoidoscopy):  Excessive amounts of blood in the stool  Significant tenderness or worsening of abdominal pains  Swelling of the abdomen that is new, acute  Fever of 100F or higher   For urgent or emergent issues, a gastroenterologist can be reached at any hour by calling [phone].   DIET:  We do recommend a small meal at first, but then you may proceed to your regular diet.  Drink plenty of fluids but you should avoid alcoholic beverages for 24 hours.  ACTIVITY:  You should plan to take it easy for the rest of today and you should NOT DRIVE or use heavy machinery until tomorrow (because of the sedation medicines used during the test).     FOLLOW UP: Our staff will call the number listed on your records the next business day following your procedure to check on you and address any questions or concerns that you may have regarding the information given to you following your procedure. If we do not reach you, we will leave a message.  However, if you are feeling well and you are not experiencing any problems, there is no need to return our call.  We will assume that you have returned to your regular daily activities without incident.  If any biopsies were taken you will be contacted by phone or by letter within the next 1-3 weeks.  Please call us at [phone] if you have not heard about the biopsies in 3 weeks.    SIGNATURES/CONFIDENTIALITY: You and/or your care partner have signed paperwork which will be entered into your electronic medical record.  These signatures attest to the fact that that the information above on your After Visit Summary has been reviewed and is understood.  Full responsibility of the confidentiality of this discharge information lies with you and/or your care-partner.  Thank you for letting us take care of your healthcare needs today. 

## 2017-01-22 NOTE — Telephone Encounter (Signed)
Returned patient's call.  All documented in other telephone encounter.

## 2017-01-22 NOTE — Telephone Encounter (Signed)
Pt is calling back about his procedure and his medication

## 2017-01-22 NOTE — Telephone Encounter (Signed)
Returned patient's call. He was concerned about the sedation he was going to get for his procedure.  He was wanting gas and not propofol and continued to say the front desk lady was lying. He states that she told him it was a new type.  I advised patient that it would be Propofol and if he has any questions regarding the sedation, he can ask the CRNA Josh Monday when he is here.  He agreed and I advised him to come on in and we would address any concerns at his visit.  He was very uptight and cursed while speaking to me.

## 2017-01-23 ENCOUNTER — Telehealth: Payer: Self-pay | Admitting: *Deleted

## 2017-01-23 ENCOUNTER — Telehealth: Payer: Self-pay | Admitting: Internal Medicine

## 2017-01-23 ENCOUNTER — Encounter: Payer: Self-pay | Admitting: Gastroenterology

## 2017-01-23 NOTE — Telephone Encounter (Signed)
No answer. Busy signal. Unable to leave message.

## 2017-01-23 NOTE — Telephone Encounter (Signed)
Error

## 2017-01-23 NOTE — Telephone Encounter (Signed)
Pt returned call and said "he is hanging in there"  Doing okay after procedure

## 2017-01-23 NOTE — Telephone Encounter (Signed)
Pt knows there were no specimens removed and he has a 10 year colon recall.

## 2017-02-09 ENCOUNTER — Other Ambulatory Visit: Payer: Self-pay | Admitting: Internal Medicine

## 2017-02-12 ENCOUNTER — Telehealth: Payer: Self-pay | Admitting: Internal Medicine

## 2017-02-12 NOTE — Telephone Encounter (Signed)
Pt would like for all of his medication except his viagra to go to [facility] on [address] [phone]

## 2017-02-12 NOTE — Telephone Encounter (Signed)
[facility] on [address] [phone] was added to pt's chart

## 2017-02-17 ENCOUNTER — Other Ambulatory Visit: Payer: Self-pay | Admitting: Internal Medicine

## 2017-03-02 ENCOUNTER — Telehealth: Payer: Self-pay | Admitting: Cardiovascular Disease

## 2017-03-02 NOTE — Telephone Encounter (Signed)
New Message   [phone] cell phone  Patient called this in, does not have appointment yet, he is just trying to get everything lined up and ready   1. What dental office are you calling from? [facility]  2. What is your office phone and fax number?  Does not know  3. What type of procedure is the patient having performed? Does not know   4. What date is procedure scheduled? Not schedule  5. What is your question (ex. Antibiotics prior to procedure, holding medication-we need to know how long dentist wants pt to hold med)?  Does he need premed? Are there any special instructions that he needs ?

## 2017-03-02 NOTE — Telephone Encounter (Signed)
Spoke with patient who states he would like to schedule the CT to check his aneurysm that Dr. Acie Fredrickson advised him to get last October. His mother has an appointment at our office on 8/27 and he would like to have the CT on this date. He states he can only have the test if he has to hold his breath 12 seconds or less (I had previously asked our CT technician this information).  I advised I will call him to schedule this test when I return to the office on Wednesday. He verbalized understanding and agreement and thanked me for the call.

## 2017-03-04 ENCOUNTER — Telehealth: Payer: Self-pay | Admitting: Cardiovascular Disease

## 2017-03-04 NOTE — Telephone Encounter (Signed)
New message ° ° ° °Pt is calling asking for a call back. °

## 2017-03-04 NOTE — Telephone Encounter (Signed)
Patient called with request for his mother, Bently Wyss; documentation is in her chart.

## 2017-03-04 NOTE — Telephone Encounter (Signed)
New message ° ° ° ° °Returning a call to the nurse °

## 2017-03-04 NOTE — Telephone Encounter (Signed)
Spoke with patient who states he is not ready to schedule his CT yet. I advised that he called me on Monday to get it scheduled and he states yes, but he is not ready to get it done now. He states his brother would like to speak to Dr. Acie Fredrickson about his mother. I reviewed his mother's chart and advised that we do not have his brother's name listed on DPR. I advised that we will have to get his mother's permission to talk to his brother. He verbalized understanding.

## 2017-03-09 ENCOUNTER — Encounter: Payer: Self-pay | Admitting: Internal Medicine

## 2017-03-09 ENCOUNTER — Ambulatory Visit (INDEPENDENT_AMBULATORY_CARE_PROVIDER_SITE_OTHER): Payer: Medicare Other | Admitting: Internal Medicine

## 2017-03-09 VITALS — BP 122/62 | HR 92 | Temp 98.1°F | Ht 67.0 in | Wt 171.8 lb

## 2017-03-09 DIAGNOSIS — I712 Thoracic aortic aneurysm, without rupture, unspecified: Secondary | ICD-10-CM

## 2017-03-09 MED ORDER — LEVOTHYROXINE SODIUM 137 MCG PO TABS: 150.0000 ug | ORAL_TABLET | Freq: Every day | ORAL | 3 refills | Status: DC

## 2017-03-09 NOTE — Progress Notes (Signed)
Subjective:    Patient ID: Andrew Jennings, male    DOB: 30-Mar-1961, 56 y.o.   MRN: [id number]  HPI  56 year old patient who has hypothyroidism.  Most recent TSH was slightly suppressed. The patient's mother has been hospitalized recently with sepsis syndrome, but has recovered).  Presently is receiving rehabilitation.  He is quite concerned about sepsis and this is the reason that prompted his visit. He does have poor dental hygiene and anticipates total, dental extractions and dentures in the near future He has a history of a aneurysm involving the  ascending aorta and is planning on follow-up imaging studies in 2 weeks  . Past Medical History:  Diagnosis Date  . AAA (abdominal aortic aneurysm) ([place])   . Adenoma of large intestine   . Allergy   . Arrhythmia   . Arthritis   . Depression   . Glaucoma   . Headache(784.0)   . Hemorrhoids   . Hyperlipidemia   . Hypothyroid   . Intestinal polyps   . 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 Laryngeal cancer ([place])   . Schizophrenia ([place])   . Sleep apnea   . 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 Marital status: Married    Spouse name: N/A  . Number of children: N/A  . Years of education: N/A   Occupational History  . Not on file.   Social History Main Topics  . Smoking status: Former Smoker    Types: Cigarettes    Quit date: 05/02/2009  . Smokeless tobacco: Never Used  . Alcohol use Yes     Comment: occasional  . Drug use: No  . Sexual activity: Not on file   Other Topics Concern  . Not on file   Social History Narrative  . No narrative on file    Past Surgical History:  Procedure Laterality Date  . COLONOSCOPY    . NECK SURGERY     BLOOD CLOT IN NECK  . UMBILICAL HERNIA REPAIR      Family History  Problem Relation Age of Onset  . Arthritis Mother   . Hyperlipidemia Mother   . Heart disease Mother   . Arthritis Father   . Colon cancer Neg Hx     Allergies    Allergen Reactions  . Darvocet [Propoxyphene N-Acetaminophen] Other (See Comments)    "Makes me feel strange"..Per pt   . Penicillins     Makes sick, per pt   . Wellbutrin [Bupropion]     Makes me feel sick    Current Outpatient Prescriptions on File Prior to Visit  Medication Sig Dispense Refill  . Ascorbic Acid (VITAMIN C ER PO) Take by mouth.    Marland Kitchen aspirin 81 MG tablet Take 81 mg by mouth daily.    . Cyanocobalamin (VITAMIN B 12) 100 MCG LOZG Take 1,000 mg by mouth daily. 100 lozenge 2  . diphenhydrAMINE (BENADRYL) 50 MG capsule Take 50 mg by mouth every 6 (six) hours as needed for itching or allergies.     . haloperidol (HALDOL) 10 MG tablet Take 30 mg by mouth daily.    . haloperidol (HALDOL) 5 MG tablet Take 5 mg by mouth daily.    . sildenafil (REVATIO) 20 MG tablet TAKE ONE TO FIVE TABLETS BY MOUTH DAILY AS NEEDED 20 tablet 0  . rosuvastatin (CRESTOR) 10 MG tablet Take 1 tablet (10 mg total) by mouth daily. 90 tablet 3  Current Facility-Administered Medications on File Prior to Visit  Medication Dose Route Frequency Provider Last Rate Last Dose  . 0.9 %  sodium chloride infusion  500 mL Intravenous Continuous Irene Shipper, MD        BP 122/62 (BP Location: Left Arm, Patient Position: Sitting, Cuff Size: Normal)   Pulse 92   Temp 98.1 F (36.7 C) (Oral)   Ht 5\' 7"  (1.702 m)   Wt 171 lb 12.8 oz (77.9 kg)   SpO2 94%   BMI 26.91 kg/m     Review of Systems  Psychiatric/Behavioral: The patient is nervous/anxious.        Objective:   Physical Exam  Constitutional: He appears well-developed and well-nourished. No distress.  HENT:  Poor dental hygiene with multiple missing teeth  Neck: Normal range of motion. No thyromegaly present.  Cardiovascular: Normal rate, regular rhythm and normal heart sounds.   Pulmonary/Chest: Breath sounds normal.  Abdominal: Bowel sounds are normal.          Assessment & Plan:   Hypothyroidism.  The patient's last TSH was  slightly suppressed.  We'll decrease levothyroxine to 0.137 milligrams daily History of aneurysm of the ascending aorta.  Follow-up imaging study stressed.  He states he plans to have follow-up imaging study in 2 weeks per cardiology  Patient was reassured that he has no clinical evidence of sepsis  [name]

## 2017-03-09 NOTE — Patient Instructions (Signed)
  Follow-up cardiology with imaging study of your aortic aneurysm  Return here in 6 months for follow-up or as needed

## 2017-03-13 ENCOUNTER — Telehealth: Payer: Self-pay | Admitting: Cardiovascular Disease

## 2017-03-13 NOTE — Telephone Encounter (Signed)
°  New Prob  Requesting to speak to nurse regarding coming in sooner for appointment. Please call.

## 2017-03-13 NOTE — Telephone Encounter (Signed)
Received call from patient regarding appointment. He states he would like to come in sooner than the end of September. I advised that Dr. Acie Fredrickson has openings next week. He states he is not at his calendar and asked me to call back to remind him. I advised that I will schedule him for Wednesday and that someone from our office will call to remind him of the time. He verbalized understanding and agreement and thanked me for the call.

## 2017-03-18 ENCOUNTER — Encounter (INDEPENDENT_AMBULATORY_CARE_PROVIDER_SITE_OTHER): Payer: Self-pay

## 2017-03-18 ENCOUNTER — Encounter: Payer: Self-pay | Admitting: Cardiovascular Disease

## 2017-03-18 ENCOUNTER — Ambulatory Visit (INDEPENDENT_AMBULATORY_CARE_PROVIDER_SITE_OTHER): Payer: Medicare Other | Admitting: Cardiovascular Disease

## 2017-03-18 VITALS — BP 108/82 | HR 82 | Ht 67.0 in | Wt 168.6 lb

## 2017-03-18 DIAGNOSIS — E782 Mixed hyperlipidemia: Secondary | ICD-10-CM

## 2017-03-18 DIAGNOSIS — I712 Thoracic aortic aneurysm, without rupture, unspecified: Secondary | ICD-10-CM

## 2017-03-18 NOTE — Progress Notes (Signed)
Andrew Jennings Date of Birth  [birth year]       [facility]    [employer] [address], [place], [place]  [address]   [place], [place]  [address] [place]   Fax  [phone]     Fax [phone]  Problem List: 1. Thoracic aortic aneurism - small  2. Hyperlipidemia 3. Laryngeal cancer ~2010, XRT 4. Bipolar disorder    History of Present Illness:  Andrew Jennings is a 56 year old gentleman who presents today for followup of his thoracic aortic aneurysm. He was originally told in [place] that he had a thoracic aneurysm. He was previously followed by Dr. Rollene Fare.    Occasional CP - seems musculoskelatal.  Walks 20-30 minutes a day.   Used to smoke - stopped when he developed larangyeal cancer  January 22, 2015:  Andrew Jennings is seen in follow up for his ascending aortic aneurism. The MRI from 2015 reveals:  IMPRESSION: Maximal diameter of the ascending aorta above the valve has increased from 3.8 cm to 4.2 cm. At the sino-tubular junction, it has increased from 3.8 cm to 4.1 cm. It is grossly unchanged at the sinus of Valsalva.  He did not like the MRA because he was not able to hold his breath that long .  He is doing well.   Oct. 18, 2017:  Andrew Jennings is seen today for follow-up of his thoracic aortic aneurysm. He had an MRI scan in 2015. He does not want have an MRI a again because it requires him to hold his breath for   He denies any chest pain or shortness of breath. His blood pressure and heart rate are normal.  Sept. 5, 2018:  Andrew Jennings is seen for follow up .   Has a small thoracic arortic aneurism  Is scheduled for a CT scan soon .  The last imaging of his aorta was 2015.   Current Outpatient Prescriptions on File Prior to Visit  Medication Sig Dispense Refill  . Ascorbic Acid (VITAMIN C ER PO) Take 1 Dose by mouth daily.     Marland Kitchen aspirin 81 MG tablet Take 81 mg by mouth daily.    . Cyanocobalamin  (VITAMIN B 12) 100 MCG LOZG Take 1,000 mg by mouth daily. 100 lozenge 2  . diphenhydrAMINE (BENADRYL) 50 MG capsule Take 300 mg by mouth at bedtime.     . haloperidol (HALDOL) 10 MG tablet Take 30 mg by mouth daily.    Marland Kitchen levothyroxine (SYNTHROID, LEVOTHROID) 137 MCG tablet Take 1 tablet (137 mcg total) by mouth daily. 90 tablet 3  . sildenafil (REVATIO) 20 MG tablet TAKE ONE TO FIVE TABLETS BY MOUTH DAILY AS NEEDED 20 tablet 0   Current Facility-Administered Medications on File Prior to Visit  Medication Dose Route Frequency Provider Last Rate Last Dose  . 0.9 %  sodium chloride infusion  500 mL Intravenous Continuous Irene Shipper, MD        Allergies  Allergen Reactions  . Darvocet [Propoxyphene N-Acetaminophen] Other (See Comments)    "Makes me feel strange"..Per pt   . Penicillins     Makes sick, per pt   . Wellbutrin [Bupropion]     Makes me feel sick    Past Medical History:  Diagnosis Date  . AAA (abdominal aortic aneurysm) ([place])   . Adenoma of large intestine   . Allergy   . Arrhythmia   . Arthritis   .  Depression   . Glaucoma   . Headache(784.0)   . Hemorrhoids   . Hyperlipidemia   . Hypothyroid   . Intestinal polyps   . 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 Laryngeal cancer ([place])   . Schizophrenia ([place])   . Sleep apnea   . 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 COLONOSCOPY    . NECK SURGERY     BLOOD CLOT IN NECK  . UMBILICAL HERNIA REPAIR      History  Smoking Status  . Former Smoker  . Types: Cigarettes  . Quit date: 05/02/2009  Smokeless Tobacco  . Never Used    History  Alcohol Use  . Yes    Comment: occasional    Family History  Problem Relation Age of Onset  . Arthritis Mother   . Hyperlipidemia Mother   . Heart disease Mother   . Arthritis Father   . Colon cancer Neg Hx     Reviw of Systems:  Reviewed in the HPI.  All other systems are negative.  Physical Exam: Blood  pressure 108/82, pulse 82, height 5\' 7"  (1.702 m), weight 168 lb 9.6 oz (76.5 kg). Wt Readings from Last 3 Encounters:  03/18/17 168 lb 9.6 oz (76.5 kg)  03/09/17 171 lb 12.8 oz (77.9 kg)  01/22/17 165 lb (74.8 kg)     General: Well developed, well nourished, in no acute distress.  Head: Normocephalic, atraumatic, sclera non-icteric, mucus membranes are moist,   Neck: Supple. Carotids are 2 + without bruits. No JVD   Lungs: Clear   Heart: RR, normal S1S2  Abdomen: Soft, non-tender, non-distended with normal bowel sounds.  Msk:  Strength and tone are normal   Extremities: No clubbing or cyanosis. No edema.  Distal pedal pulses are 2+ and equal    Neuro: CN II - XII intact.  Alert and oriented X 3.   Psych:  Normal  ECG: Sept. 5, 2018:   NSR at 82.  Poor R wave progression .  Assessment / Plan:   1. Thoracic aortic aneurism -  he did not do well in the MRI scanner and does not want to have any more .  Will do a CT of the chest . I'll see him in 1 year    2. Hyperlipidemia - on  Crestor  10 mg a day  Check labs in today   3. Laryngeal cancer ~2010, XRT 4. Bipolar disorder    Mertie Moores, MD  03/18/2017 4:06 PM    [facility] [place],  [place], [place]  [address] Pager [phone] Phone: [phone]; Fax: [phone]

## 2017-03-18 NOTE — Patient Instructions (Signed)
Medication Instructions:  Your physician recommends that you continue on your current medications as directed. Please refer to the Current Medication list given to you today.   Labwork: TODAY - cholesterol, liver panel, basic metabolic panel    Testing/Procedures: Non-Cardiac CT Angiography (CTA), is a special type of CT scan that uses a computer to produce multi-dimensional views of major blood vessels throughout the body. In CT angiography, a contrast material is injected through an IV to help visualize the blood vessels   Follow-Up: Your physician wants you to follow-up in: 1 year with Dr. Acie Fredrickson.  You will receive a reminder letter in the mail two months in advance. If you don't receive a letter, please call our office to schedule the follow-up appointment.   If you need a refill on your cardiac medications before your next appointment, please call your pharmacy.   Thank you for choosing [facility]! Christen Bame, RN [phone]

## 2017-03-19 ENCOUNTER — Other Ambulatory Visit: Payer: Self-pay | Admitting: *Deleted

## 2017-03-19 ENCOUNTER — Telehealth: Payer: Self-pay | Admitting: Cardiovascular Disease

## 2017-03-19 DIAGNOSIS — I712 Thoracic aortic aneurysm, without rupture, unspecified: Secondary | ICD-10-CM

## 2017-03-19 LAB — HEPATIC FUNCTION PANEL
ALBUMIN: 4.5 g/dL (ref 3.5–5.5)
ALK PHOS: 55 IU/L (ref 39–117)
ALT: 17 IU/L (ref 0–44)
AST: 19 IU/L (ref 0–40)
BILIRUBIN TOTAL: 1 mg/dL (ref 0.0–1.2)
Bilirubin, Direct: 0.2 mg/dL (ref 0.00–0.40)
Total Protein: 6.9 g/dL (ref 6.0–8.5)

## 2017-03-19 LAB — LIPID PANEL
CHOL/HDL RATIO: 4.4 ratio (ref 0.0–5.0)
Cholesterol, Total: 177 mg/dL (ref 100–199)
HDL: 40 mg/dL (ref >39)
LDL CALC: 93 mg/dL (ref 0–99)
Triglycerides: 219 mg/dL — ABNORMAL HIGH (ref 0–149)
VLDL Cholesterol Cal: 44 mg/dL — ABNORMAL HIGH (ref 5–40)

## 2017-03-19 LAB — BASIC METABOLIC PANEL
BUN/Creatinine Ratio: 14 (ref 9–20)
BUN: 14 mg/dL (ref 6–24)
CALCIUM: 9.4 mg/dL (ref 8.7–10.2)
CO2: 25 mmol/L (ref 20–29)
CREATININE: 0.99 mg/dL (ref 0.76–1.27)
Chloride: 103 mmol/L (ref 96–106)
GFR calc Af Amer: 97 mL/min/{1.73_m2} (ref >59)
GFR, EST NON AFRICAN AMERICAN: 84 mL/min/{1.73_m2} (ref >59)
Glucose: 88 mg/dL (ref 65–99)
Potassium: 4.1 mmol/L (ref 3.5–5.2)
Sodium: 143 mmol/L (ref 134–144)

## 2017-03-19 NOTE — Telephone Encounter (Signed)
I returned pt's call who wants to now why no one has called him yet to schedule his CT. I apologized to the pt that he has not had a call yet today, however the CT dept is probably busy and just has not had a moment to call him. Pt then asked me if Kelby Aline RN for Dr. Acie Fredrickson was going to be in the office tomorrow. I answered yes according to the schedule. Pt then asked how long she was going to be in the office. I told pt that I do not have access to what hours she will be in the office. Pt was annoyed. I asked pt if RN was expecting to see him tomorrow and he said no just that no one has called him yet to schedule CT. I stated to the pt that that is what I said a few minutes earlier that I will s/w the CT and ask them to call him to schedule his CT.

## 2017-03-19 NOTE — Telephone Encounter (Signed)
New message     Please call pt, he has questions about the testing that he has coming up ?

## 2017-03-20 NOTE — Telephone Encounter (Signed)
Spoke with patient who states he received a call from our office with the date and time of his CT. I also reviewed his lab results with him. He denies questions and thanked me for the call.

## 2017-03-25 ENCOUNTER — Telehealth: Payer: Self-pay | Admitting: Cardiovascular Disease

## 2017-03-25 ENCOUNTER — Ambulatory Visit (INDEPENDENT_AMBULATORY_CARE_PROVIDER_SITE_OTHER)
Admission: RE | Admit: 2017-03-25 | Discharge: 2017-03-25 | Disposition: A | Discharge status: Home / Self Care | Payer: Medicare Other | Source: Ambulatory Visit | Attending: Cardiovascular Disease | Admitting: Cardiovascular Disease

## 2017-03-25 DIAGNOSIS — I712 Thoracic aortic aneurysm, without rupture, unspecified: Secondary | ICD-10-CM

## 2017-03-25 MED ORDER — IOPAMIDOL (ISOVUE-370) INJECTION 76%
100.0000 mL | Freq: Once | INTRAVENOUS | Status: AC | PRN
  Administered 2017-03-25: 100 mL via INTRAVENOUS

## 2017-03-25 NOTE — Telephone Encounter (Signed)
Spoke with patient and reassured him that CT technician advised that he will have to hold his breath for only 13 seconds. Patient verbalized understanding and thanked me for the call.

## 2017-03-25 NOTE — Telephone Encounter (Signed)
New Message   pt verbalized that he is returning call for rn  He he said he has some further questions about the timing,  The exact timing of his test today pt verbalized that if it is over the 52min time he will not come to appt

## 2017-03-25 NOTE — Telephone Encounter (Signed)
Spoke with patient and advised that he can take the medication in question. He asked about CT results and advised that they will need to be read by a radiologist and that I will call him when Dr. Acie Fredrickson gets the results. He thanked me for the call.

## 2017-03-25 NOTE — Telephone Encounter (Signed)
New message    Just had a CT done can he take a  sildenafil (REVATIO) 20 MG tablet TAKE ONE TO FIVE TABLETS BY MOUTH DAILY AS NEEDED

## 2017-03-26 ENCOUNTER — Telehealth: Payer: Self-pay | Admitting: Cardiovascular Disease

## 2017-03-26 ENCOUNTER — Telehealth: Payer: Self-pay | Admitting: *Deleted

## 2017-03-26 NOTE — Telephone Encounter (Signed)
"  I'm a patient of Dr. Redmond Baseman.  I had cancer ten years ago.  I stoped smoking, had an aneurysm.  Calling for Dr. Redmond Baseman because I need to know if emphysema grows."  Dr. Redmond Baseman is not a provider with [facility].  Asked if he needs oncologist, offered to retrieved number for Dr. Redmond Baseman. "What doctors do you have there?  Will you just answer the question does emphysema grow!      Shared emphysema is not something that physically grows like a tumor.  Emphysema is damage to lung tissue.  Once tissue is damaged there is no cure or reversing tissue to back to normal.  Function of lungs is important for oxygen, carbon dioxide gas exchange.  Manage symptoms, prevent further damage to lungs.    Patient ended call or call lost.

## 2017-03-26 NOTE — Telephone Encounter (Signed)
New message      Pt states that he had mild emphysema years ago.  He want to know if it continues to grow in your body?  Please call

## 2017-03-26 NOTE — Telephone Encounter (Signed)
Reviewed with patient that the emphysema Andrew Jennings spoke to him about from his recent testing is unchanged from prior studies dating back to 2013.  Advised there is no evidence of any progression.  He thanked me for the call and information.

## 2017-03-27 ENCOUNTER — Telehealth: Payer: Self-pay | Admitting: Cardiovascular Disease

## 2017-03-27 NOTE — Telephone Encounter (Signed)
Patient is concerned about his emphysema progressing. Patient stated he stopped smoking 10 years ago, and wanted to know if that helps. Informed patient that stopping smoking defiantly helps. Informed patient that if he is not being exposed to environmental hazards that are dangerous to his lungs that his emphysema should not progress. Informed patient that a healthy life style should help as well. Patient verbalized understanding and had no other questions at this time.

## 2017-03-27 NOTE — Telephone Encounter (Signed)
New message   Pt states he is returning Michelle's call. Requests that a triage nurse call him back.

## 2017-03-30 ENCOUNTER — Telehealth: Payer: Self-pay | Admitting: Cardiovascular Disease

## 2017-03-30 NOTE — Telephone Encounter (Signed)
Spoke with patient who asked if his emphysema would get worse without smoking. I advised that I do not know enough about emphysema to answer that question. I asked if he has ever seen a pulmonologist and he stated that he did have one in [place] but not here. I advised that he should call his PCP for referral to a pulmonologist. He states he does not want to go see another doctor and thanked me for calling him back.

## 2017-03-30 NOTE — Telephone Encounter (Signed)
New message    Pt is calling stating he is returning call from [place]. Please call.

## 2017-04-02 ENCOUNTER — Encounter: Payer: Self-pay | Admitting: Adult Health

## 2017-04-02 ENCOUNTER — Ambulatory Visit (INDEPENDENT_AMBULATORY_CARE_PROVIDER_SITE_OTHER): Payer: Medicare Other | Admitting: Adult Health

## 2017-04-02 VITALS — BP 122/80 | Temp 98.0°F | Wt 167.0 lb

## 2017-04-02 DIAGNOSIS — R319 Hematuria, unspecified: Secondary | ICD-10-CM | POA: Diagnosis not present

## 2017-04-02 LAB — POC URINALSYSI DIPSTICK (AUTOMATED)
BILIRUBIN UA: NEGATIVE
GLUCOSE UA: NEGATIVE
KETONES UA: NEGATIVE
Leukocytes, UA: NEGATIVE
Nitrite, UA: NEGATIVE
Spec Grav, UA: >=1.03 — AB (ref 1.010–1.025)
Urobilinogen, UA: 0.2 E.U./dL
pH, UA: 6 (ref 5.0–8.0)

## 2017-04-02 NOTE — Progress Notes (Signed)
Subjective:    Patient ID: Andrew Jennings, male    DOB: [birth year], 56 y.o.   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  He presents to the office today for an acute complaint of hematuria for two days. He reports dark urine but that the urine has been getting lighter as the week has gone on. He also report that he has had pain at the end of his stream. He is no longer having this pain.   Denies any history of kidney stones. Has not been having any low back pain.   Review of Systems See HPI   Past Medical History:  Diagnosis Date  . AAA (abdominal aortic aneurysm) ([place])   . Adenoma of large intestine   . Allergy   . Arrhythmia   . Arthritis   . Depression   . Glaucoma   . Headache(784.0)   . Hemorrhoids   . Hyperlipidemia   . Hypothyroid   . Intestinal polyps   . Laryngeal cancer ([place])    head & neck ca  . Laryngeal cancer ([place])   . Schizophrenia ([place])   . Sleep apnea   . Terminal ileitis [facility])     Social History   Social History  . Marital status: Married    Spouse name: N/A  . Number of children: N/A  . Years of education: N/A   Occupational History  . Not on file.   Social History Main Topics  . Smoking status: Former Smoker    Types: Cigarettes    Quit date: 05/02/2009  . Smokeless tobacco: Never Used  . Alcohol use Yes     Comment: occasional  . Drug use: No  . Sexual activity: Not on file   Other Topics Concern  . Not on file   Social History Narrative  . No narrative on file    Past Surgical History:  Procedure Laterality Date  . COLONOSCOPY    . NECK SURGERY     BLOOD CLOT IN NECK  . UMBILICAL HERNIA  REPAIR      Family History  Problem Relation Age of Onset  . Arthritis Mother   . Hyperlipidemia Mother   . Heart disease Mother   . Arthritis Father   . Colon cancer Neg Hx     Allergies  Allergen Reactions  . Darvocet [Propoxyphene N-Acetaminophen] Other (See Comments)    "Makes me feel strange"..Per pt   . Penicillins     Makes sick, per pt   . Wellbutrin [Bupropion]     Makes me feel sick    Current Outpatient Prescriptions on File Prior to Visit  Medication Sig Dispense Refill  . aspirin 81 MG tablet Take 81 mg by mouth daily.    . Cyanocobalamin (VITAMIN B 12) 100 MCG LOZG Take 1,000 mg by mouth daily. 100 lozenge 2  . diphenhydrAMINE (BENADRYL) 50 MG capsule Take 300 mg by mouth at bedtime.     . haloperidol (HALDOL) 10 MG tablet Take 30 mg by mouth daily.    Marland Kitchen levothyroxine (SYNTHROID, LEVOTHROID) 137 MCG tablet Take 1 tablet (137 mcg total) by mouth daily. 90 tablet 3  . rosuvastatin (CRESTOR) 10 MG tablet Take 10 mg by mouth daily.    . sildenafil (  REVATIO) 20 MG tablet TAKE ONE TO FIVE TABLETS BY MOUTH DAILY AS NEEDED 20 tablet 0   Current Facility-Administered Medications on File Prior to Visit  Medication Dose Route Frequency Provider Last Rate Last Dose  . 0.9 %  sodium chloride infusion  500 mL Intravenous Continuous Irene Shipper, MD        BP 122/80   Temp 98 F (36.7 C) (Oral)   Wt 167 lb (75.8 kg)   BMI 26.16 kg/m       Objective:   Physical Exam  Constitutional: He is oriented to person, place, and time. He appears well-developed and well-nourished. No distress.  Cardiovascular: Normal rate, regular rhythm, normal heart sounds and intact distal pulses.  Exam reveals no gallop and no friction rub.   No murmur heard. Pulmonary/Chest: Effort normal and breath sounds normal. No respiratory distress. He has no wheezes. He has no rales. He exhibits no tenderness.  Abdominal: Soft. Bowel sounds are normal. He exhibits no distension. There is no  hepatosplenomegaly, splenomegaly or hepatomegaly. There is no tenderness. There is no rebound, no guarding and no CVA tenderness.  Neurological: He is alert and oriented to person, place, and time.  Skin: Skin is warm and dry. No rash noted. No erythema. No pallor.  Psychiatric: He has a normal mood and affect. His behavior is normal. Judgment and thought content normal.  Nursing note and vitals reviewed.     Assessment & Plan:  1. Hematuria, unspecified type - POCT Urinalysis Dipstick (Automated)- trace blood. No leuks or nitrates.  - Possible kidney stone that he passed?  - Advised to continue to drink fluid  - He can follow up next week for another UA to see if blood has cleared    Dorothyann Peng, NP

## 2017-04-06 ENCOUNTER — Telehealth: Payer: Self-pay | Admitting: Internal Medicine

## 2017-04-06 NOTE — Telephone Encounter (Signed)
Pt would like to have a call back concerning a personal matter.

## 2017-04-07 ENCOUNTER — Ambulatory Visit (INDEPENDENT_AMBULATORY_CARE_PROVIDER_SITE_OTHER): Payer: Medicare Other | Admitting: Internal Medicine

## 2017-04-07 ENCOUNTER — Encounter: Payer: Self-pay | Admitting: Internal Medicine

## 2017-04-07 VITALS — BP 120/72 | HR 86 | Temp 97.9°F | Ht 67.0 in | Wt 170.4 lb

## 2017-04-07 DIAGNOSIS — Z87448 Personal history of other diseases of urinary system: Secondary | ICD-10-CM

## 2017-04-07 DIAGNOSIS — Z87898 Personal history of other specified conditions: Secondary | ICD-10-CM

## 2017-04-07 NOTE — Progress Notes (Signed)
Subjective:    Patient ID: Andrew Jennings, male    DOB: 06/26/1961, 56 y.o.   MRN: [id number]  HPI  56 year old patient who was seen last week after an episode of gross painless hematuria.  Urinalysis taken for microscopic hematuria. Patient has no further history of hematuria and denies any voiding symptoms.  He states that he has been under considerable stress due to the poor health of his mother.  He is quite angry and belligerent today, cursing incessantly.  Past Medical History:  Diagnosis Date  . AAA (abdominal aortic aneurysm) ([place])   . Adenoma of large intestine   . Allergy   . Arrhythmia   . Arthritis   . Depression   . Glaucoma   . Headache(784.0)   . Hemorrhoids   . Hyperlipidemia   . Hypothyroid   . Intestinal polyps   . Laryngeal cancer ([place])    head & neck ca  . Laryngeal cancer ([place])   . Schizophrenia ([place])   . Sleep apnea   . Terminal ileitis [facility])      Social History   Social History  . Marital status: Married    Spouse name: N/A  . Number of children: N/A  . Years of education: N/A   Occupational History  . Not on file.   Social History Main Topics  . Smoking status: Former Smoker    Types: Cigarettes    Quit date: 05/02/2009  . Smokeless tobacco: Never Used  . Alcohol use Yes     Comment: occasional  . Drug use: No  . Sexual activity: Not on file   Other Topics Concern  . Not on file   Social History Narrative  . No narrative on file    Past Surgical History:  Procedure Laterality Date  . COLONOSCOPY    . NECK SURGERY     BLOOD CLOT IN NECK  . UMBILICAL HERNIA REPAIR      Family History  Problem Relation Age of Onset  . Arthritis Mother   . Hyperlipidemia Mother   . Heart disease Mother   . Arthritis Father   . Colon cancer Neg Hx     Allergies  Allergen Reactions  . Darvocet [Propoxyphene N-Acetaminophen] Other (See Comments)    "Makes me feel strange"..Per pt   . Penicillins     Makes sick, per pt   .  Wellbutrin [Bupropion]     Makes me feel sick    Current Outpatient Prescriptions on File Prior to Visit  Medication Sig Dispense Refill  . aspirin 81 MG tablet Take 81 mg by mouth daily.    . diphenhydrAMINE (BENADRYL) 50 MG capsule Take 300 mg by mouth at bedtime.     . haloperidol (HALDOL) 10 MG tablet Take 30 mg by mouth daily.    Marland Kitchen levothyroxine (SYNTHROID, LEVOTHROID) 137 MCG tablet Take 1 tablet (137 mcg total) by mouth daily. 90 tablet 3  . rosuvastatin (CRESTOR) 10 MG tablet Take 10 mg by mouth daily.    . sildenafil (REVATIO) 20 MG tablet TAKE ONE TO FIVE TABLETS BY MOUTH DAILY AS NEEDED 20 tablet 0   Current Facility-Administered Medications on File Prior to Visit  Medication Dose Route Frequency Provider Last Rate Last Dose  . 0.9 %  sodium chloride infusion  500 mL Intravenous Continuous Irene Shipper, MD        BP 120/72 (BP Location: Left Arm, Patient Position: Sitting, Cuff Size: Normal)   Pulse 86   Temp 97.9 F (  36.6 C) (Oral)   Ht 5\' 7"  (1.702 m)   Wt 170 lb 6.4 oz (77.3 kg)   SpO2 97%   BMI 26.69 kg/m    Review of Systems  Genitourinary: Positive for hematuria.       Objective:   Physical Exam  Constitutional: He appears well-developed and well-nourished.  Angry and belligerent Blood pressure 120/72          Assessment & Plan:   History of gross hematuria/microscopic hematuria. Options discussed.  Will refer to urology for further evaluation of gross hematuria.  Patient agreeable  [name]

## 2017-04-07 NOTE — Telephone Encounter (Signed)
Called pt today and he was irate and using profanity with me, I was unable to see what the pt needed.  Please advise

## 2017-04-07 NOTE — Patient Instructions (Addendum)
Urology consultation to evaluate blood in the urine as discussed   Hematuria, Adult Hematuria is blood in your urine. It can be caused by a bladder infection, kidney infection, prostate infection, kidney stone, or cancer of your urinary tract. Infections can usually be treated with medicine, and a kidney stone usually will pass through your urine. If neither of these is the cause of your hematuria, further workup to find out the reason may be needed. It is very important that you tell your health care provider about any blood you see in your urine, even if the blood stops without treatment or happens without causing pain. Blood in your urine that happens and then stops and then happens again can be a symptom of a very serious condition. Also, pain is not a symptom in the initial stages of many urinary cancers. Follow these instructions at home:  Drink lots of fluid, 3-4 quarts a day. If you have been diagnosed with an infection, cranberry juice is especially recommended, in addition to large amounts of water.  Avoid caffeine, tea, and carbonated beverages because they tend to irritate the bladder.  Avoid alcohol because it may irritate the prostate.  Take all medicines as directed by your health care provider.  If you were prescribed an antibiotic medicine, finish it all even if you start to feel better.  If you have been diagnosed with a kidney stone, follow your health care provider's instructions regarding straining your urine to catch the stone.  Empty your bladder often. Avoid holding urine for long periods of time.  After a bowel movement, women should cleanse front to back. Use each tissue only once.  Empty your bladder before and after sexual intercourse if you are a male. Contact a health care provider if:  You develop back pain.  You have a fever.  You have a feeling of sickness in your stomach (nausea) or vomiting.  Your symptoms are not better in 3 days. Return sooner  if you are getting worse. Get help right away if:  You develop severe vomiting and are unable to keep the medicine down.  You develop severe back or abdominal pain despite taking your medicines.  You begin passing a large amount of blood or clots in your urine.  You feel extremely weak or faint, or you pass out. This information is not intended to replace advice given to you by your health care provider. Make sure you discuss any questions you have with your health care provider. Document Released: 06/30/2005 Document Revised: 12/06/2015 Document Reviewed: 02/28/2013 Elsevier Interactive Patient Education  2017 [employer].

## 2017-04-08 ENCOUNTER — Ambulatory Visit: Payer: Medicare Other | Admitting: Physician Assistant

## 2017-04-08 NOTE — Telephone Encounter (Signed)
Pt would like dr Raliegh Ip to return his call concerning urologist referral

## 2017-04-10 NOTE — Telephone Encounter (Signed)
Pt said they can not see him until a month later. Pt is requesting dr Raliegh Ip to call him

## 2017-04-10 NOTE — Telephone Encounter (Signed)
Please confirm that we are working on a urology referral due to a history of gross hematuria

## 2017-04-15 ENCOUNTER — Telehealth: Payer: Self-pay | Admitting: *Deleted

## 2017-04-15 NOTE — Telephone Encounter (Signed)
Please call patient and suggest  he call urology to be placed on a cancellation list for an earlier appointment

## 2017-04-15 NOTE — Telephone Encounter (Signed)
Please advise 

## 2017-04-15 NOTE — Telephone Encounter (Signed)
Called pt and made him aware, pt verbalized understanding.

## 2017-04-15 NOTE — Telephone Encounter (Signed)
Patient called stating it will be 4 weeks before he can be seen at urology, and he is requesting Dr Burnice Logan to call them and request for him to be seen sooner because he is losing weight and he does not want to wait 4 weeks. Please advise

## 2017-04-20 ENCOUNTER — Telehealth: Payer: Self-pay | Admitting: Internal Medicine

## 2017-04-20 DIAGNOSIS — R300 Dysuria: Secondary | ICD-10-CM | POA: Diagnosis not present

## 2017-04-20 NOTE — Telephone Encounter (Signed)
Pt would like to have a call back to discuss the call he called in on yesterday and left if with the after hour nurse.

## 2017-04-20 NOTE — Telephone Encounter (Signed)
Patient called and spoke with [facility]. He advised him that he did not want to speak to anyone in our office.  Dr. Raliegh Ip - [facility]. Thanks!  [place] Patient Name: Andrew Jennings Gender: Male DOB: March 29, 1961 Age: 56 Y 57 M 5 D Return Phone Number: [phone] (Primary) Address: City/State/Zip: [place] [address] Client [facility] Night - Client Client Site [facility] - Night Physician Simonne Martinet - MD Contact Type Call Who Is Calling Patient / Member / Family / Caregiver Call Type Triage / Clinical Relationship To Patient Self Return Phone Number [phone] (Primary) Chief Complaint Medication Question (non symptomatic) Reason for Call Symptomatic / Request for [place] said he has medication questions for his Dr. but does not have an appt until later . Translation No Nurse Assessment Nurse: Tye Savoy, RN, Lattie Haw Date/Time Eilene Ghazi Time): 04/19/2017 5:19:58 PM Confirm and document reason for call. If symptomatic, describe symptoms. ---callers states that he has been having burning urination, sildinafil, concerned that medication might cause cancer Comments User: Bernarda Caffey, RN Date/Time Eilene Ghazi Time): 04/19/2017 5:32:09 PM Advised Pt to see physician in 24 hours , advised him to call and make an appoitment at PCP, caller states that he did not want to as the ladies in the office were "expletives" and he was not sure that he wanted to call them. also advised pt that he could go to an urgent care center without making an appointment

## 2017-04-23 NOTE — Telephone Encounter (Signed)
Pt called very nasty and would like to have a call back on a personal matter and would not elaborate on it.

## 2017-04-23 NOTE — Telephone Encounter (Signed)
Please advise 

## 2017-05-17 ENCOUNTER — Other Ambulatory Visit: Payer: Self-pay | Admitting: Cardiovascular Disease

## 2017-05-18 DIAGNOSIS — R310 Gross hematuria: Secondary | ICD-10-CM | POA: Diagnosis not present

## 2017-05-29 DIAGNOSIS — R310 Gross hematuria: Secondary | ICD-10-CM | POA: Diagnosis not present

## 2017-06-01 ENCOUNTER — Telehealth: Payer: Self-pay | Admitting: Family Medicine

## 2017-06-01 NOTE — Telephone Encounter (Signed)
Copied from [place] #[id number]. Topic: General - Other >> Jun 01, 2017  2:17 PM Scherrie Gerlach wrote: Reason for CRM: pt would like a call back to discuss his cancer from Dr Raliegh Ip  Do you know if Dr. Raliegh Ip handled this? I would like to close chart.

## 2017-06-01 NOTE — Telephone Encounter (Signed)
Please advise 

## 2017-06-03 ENCOUNTER — Other Ambulatory Visit: Payer: Self-pay | Admitting: Urology

## 2017-06-10 ENCOUNTER — Telehealth: Payer: Self-pay | Admitting: Internal Medicine

## 2017-06-10 NOTE — Telephone Encounter (Signed)
Copied from [place] [id number]. Topic: Quick Communication - See Telephone Encounter >> Jun 10, 2017 11:22 AM Bea Graff, NT wrote: CRM for notification. See Telephone encounter for: Patient needing a refill of sildenafil called into [facility] on [address].  06/10/17.

## 2017-06-10 NOTE — Telephone Encounter (Signed)
Pt has appt on 11/29. Notified pt that refill for medication could be addressed at that time. Pt verbalized understanding.

## 2017-06-11 ENCOUNTER — Ambulatory Visit (INDEPENDENT_AMBULATORY_CARE_PROVIDER_SITE_OTHER): Payer: Medicare Other | Admitting: Internal Medicine

## 2017-06-11 ENCOUNTER — Telehealth: Payer: Self-pay | Admitting: Internal Medicine

## 2017-06-11 ENCOUNTER — Encounter: Payer: Self-pay | Admitting: Internal Medicine

## 2017-06-11 VITALS — BP 104/70 | HR 83 | Temp 97.8°F | Ht 67.0 in | Wt 172.2 lb

## 2017-06-11 DIAGNOSIS — Z8739 Personal history of other diseases of the musculoskeletal system and connective tissue: Secondary | ICD-10-CM | POA: Diagnosis not present

## 2017-06-11 DIAGNOSIS — R0789 Other chest pain: Secondary | ICD-10-CM

## 2017-06-11 DIAGNOSIS — E039 Hypothyroidism, unspecified: Secondary | ICD-10-CM

## 2017-06-11 LAB — TSH: TSH: 3.77 u[IU]/mL (ref 0.35–4.50)

## 2017-06-11 LAB — CK: CK TOTAL: 101 U/L (ref 7–232)

## 2017-06-11 MED ORDER — SILDENAFIL CITRATE 20 MG PO TABS: ORAL_TABLET | ORAL | 0 refills | Status: DC

## 2017-06-11 NOTE — Telephone Encounter (Signed)
Pt in office today (06/11/17) for follow-up visit.

## 2017-06-11 NOTE — Telephone Encounter (Signed)
Sir please see below message. Please advise. 

## 2017-06-11 NOTE — Telephone Encounter (Signed)
Please call and get update on patient's status.  I did talk with patient recently but no nothing about a diagnosis of cancer.  He is being evaluated by urology for hematuria

## 2017-06-11 NOTE — Progress Notes (Signed)
Subjective:    Patient ID: Andrew Jennings, male    DOB: 1961-04-28, 56 y.o.   MRN: [id number]  HPI 56 year old patient who is having urological evaluation due to intermittent gross hematuria. Complaints today include anterior chest wall pain.  Pain is aggravated by movement and pressure.  He was concerned about blood clots. He does have a history of an aneurysm involving the descending aorta and did have a chest CT a couple of months ago.  He does have a remote history of rhabdomyolysis and wishes to have a CPK checked.  He feels that his urine has become slightly darker.  Lab Results  Component Value Date   TSH 3.77 06/11/2017   Past Medical History:  Diagnosis Date  . AAA (abdominal aortic aneurysm) ([place])   . Adenoma of large intestine   . Allergy   . Arrhythmia   . Arthritis   . Depression   . Glaucoma   . Headache(784.0)   . Hemorrhoids   . Hyperlipidemia   . Hypothyroid   . Intestinal polyps   . Laryngeal cancer ([place])    head & neck ca  . Laryngeal cancer ([place])   . Schizophrenia ([place])   . Sleep apnea   . Terminal ileitis [facility])      Social History   Socioeconomic History  . Marital status: Married    Spouse name: Not on file  . Number of children: Not on file  . Years of education: Not on file  . Highest education level: Not on file  Social Needs  . Financial resource strain: Not on file  . Food insecurity - worry: Not on file  . Food insecurity - inability: Not on file  . Transportation needs - medical: Not on file  . Transportation needs - non-medical: Not on file  Occupational History  . Not on file  Tobacco Use  . Smoking status: Former Smoker    Types: Cigarettes    Last attempt to quit: 05/02/2009    Years since quitting: 8.1  . Smokeless tobacco: Never Used  Substance and Sexual Activity  . Alcohol use: Yes    Comment: occasional  . Drug use: No  . Sexual activity: Not on file  Other Topics Concern  . Not on file  Social History Narrative   . Not on file    Past Surgical History:  Procedure Laterality Date  . COLONOSCOPY    . NECK SURGERY     BLOOD CLOT IN NECK  . UMBILICAL HERNIA REPAIR      Family History  Problem Relation Age of Onset  . Arthritis Mother   . Hyperlipidemia Mother   . Heart disease Mother   . Arthritis Father   . Colon cancer Neg Hx     Allergies  Allergen Reactions  . Darvocet [Propoxyphene N-Acetaminophen] Other (See Comments)    "Makes me feel strange"..Per pt   . Penicillins     Makes sick, per pt   . Wellbutrin [Bupropion]     Makes me feel sick    Current Outpatient Medications on File Prior to Visit  Medication Sig Dispense Refill  . aspirin 81 MG tablet Take 81 mg by mouth daily.    . diphenhydrAMINE (BENADRYL) 50 MG capsule Take 300 mg by mouth at bedtime.     . haloperidol (HALDOL) 10 MG tablet Take 30 mg by mouth daily.    Marland Kitchen levothyroxine (SYNTHROID, LEVOTHROID) 137 MCG tablet Take 1 tablet (137 mcg total) by mouth daily. [place]  tablet 3  . rosuvastatin (CRESTOR) 10 MG tablet TAKE 1 TABLET BY MOUTH ONCE DAILY 90 tablet 3   Current Facility-Administered Medications on File Prior to Visit  Medication Dose Route Frequency Provider Last Rate Last Dose  . 0.9 %  sodium chloride infusion  500 mL Intravenous Continuous Irene Shipper, MD        BP 104/70 (BP Location: Left Arm, Patient Position: Sitting, Cuff Size: Normal)   Pulse 83   Temp 97.8 F (36.6 C) (Oral)   Ht 5\' 7"  (1.702 m)   Wt 172 lb 3.2 oz (78.1 kg)   SpO2 95%   BMI 26.97 kg/m      Review of Systems     Objective:   Physical Exam  Constitutional: He is oriented to person, place, and time. He appears well-developed.  HENT:  Head: Normocephalic.  Right Ear: External ear normal.  Left Ear: External ear normal.  Eyes: Conjunctivae and EOM are normal.  Neck: Normal range of motion.  Cardiovascular: Normal rate, regular rhythm and normal heart sounds.  Pulmonary/Chest: Breath sounds normal. He exhibits  tenderness.  Slight tenderness to palpation over the lower anterior chest wall area  Abdominal: Bowel sounds are normal.  Musculoskeletal: Normal range of motion. He exhibits no edema or tenderness.  Neurological: He is alert and oriented to person, place, and time.  Psychiatric: He has a normal mood and affect. His behavior is normal.          Assessment & Plan:   Chest wall discomfort History of intermittent gross hematuria complete urological follow-up Anxiety disorder Dyslipidemia continue statin therapy History of rhabdomyolysis.  Will check CPK per patient request  Follow-up urology Recheck here 6 months   Hypothyroidism.  Check TSH  [name]

## 2017-06-11 NOTE — Telephone Encounter (Signed)
Copied from [place] [id number]. Topic: Quick Communication - See Telephone Encounter >> Jun 11, 2017  3:55 PM [place], Laban Emperor wrote: CRM for notification. See Telephone encounter for:  Pt wanted to know if the dr wanted to do a x-ray of his lungs. 06/11/17.

## 2017-06-11 NOTE — Patient Instructions (Signed)
Return in 6 months for follow-up Urology evaluation as Scheduled

## 2017-06-12 ENCOUNTER — Telehealth: Payer: Self-pay | Admitting: Internal Medicine

## 2017-06-12 NOTE — Telephone Encounter (Signed)
Pt. Called and said he wanted to talk to Dr. Burnice Logan.  Reported he saw him yesterday, and now wants to know if he needs a chest xray.  Reported  He has had chest pain in anterior chest for 2 mos.; stated in the middle and all across the chest.  Stated if he needs a CXR, he can only come to [place], as he doesn't have the money to go anywhere else.  Pt. Very abrupt on the phonecall.  Only wants to know if Dr. Raliegh Ip. Thinks he needs a chest xray.  Advised will call the office and make Dr. Burnice Logan aware.     Copied from [place] [id number]. Topic: Quick Communication - See Telephone Encounter >> Jun 11, 2017  3:55 PM Clack, Laban Emperor wrote: CRM for notification. See Telephone encounter for:  Pt wanted to know if the dr wanted to do a x-ray of his lungs. 06/11/17.

## 2017-06-15 ENCOUNTER — Other Ambulatory Visit: Payer: Self-pay | Admitting: Internal Medicine

## 2017-06-15 ENCOUNTER — Telehealth: Payer: Self-pay | Admitting: Internal Medicine

## 2017-06-15 ENCOUNTER — Encounter (HOSPITAL_BASED_OUTPATIENT_CLINIC_OR_DEPARTMENT_OTHER): Payer: Self-pay | Admitting: *Deleted

## 2017-06-15 ENCOUNTER — Other Ambulatory Visit: Payer: Self-pay

## 2017-06-15 DIAGNOSIS — R0789 Other chest pain: Secondary | ICD-10-CM

## 2017-06-15 NOTE — Progress Notes (Addendum)
Npo after midnight arrive arrive 1015 06-24-17 [facility] friend driver, take rosuvastatin and levothyroxine sip of water in am chest ct 03-25-17 epic and on chart, ekg 03-18-17 epic and on chart, lov dr Cathie Olden 04-30-16 on chart and epic , needs I stat 4

## 2017-06-15 NOTE — Telephone Encounter (Signed)
Copied from [place] [id number]. Topic: Quick Communication - See Telephone Encounter >> Jun 15, 2017  1:37 PM Corie Chiquito, [place] wrote: CRM for notification. See Telephone encounter for: Patient called again about lab results. Would like for someone to give him a call back.  06/15/17.

## 2017-06-15 NOTE — Telephone Encounter (Signed)
Please let patient know that chest x-ray would likely not be helpful; his chest pain is from his chest wall and on exam is tender anteriorly.  Okay to observe at this point but if chest pain still present next week we will need to get a chest x-ray

## 2017-06-15 NOTE — Telephone Encounter (Signed)
Pt notified of results by Adela Lank, LPN today. See phone note dated for 06/12/17.

## 2017-06-15 NOTE — Telephone Encounter (Signed)
Please schedule chest x-ray

## 2017-06-15 NOTE — Telephone Encounter (Signed)
Please see below message    Please advise

## 2017-06-15 NOTE — Telephone Encounter (Signed)
Pt requested lab results, pt was informed of TSH and  CK results.   Pt states that he has had chest pain for 2 months and he doesn't want to wait any longer. Pt is demanding we "fix him" please advise.

## 2017-06-15 NOTE — Telephone Encounter (Signed)
Pt was called to inform of xray order.

## 2017-06-15 NOTE — Telephone Encounter (Signed)
Order placed in epic

## 2017-06-16 ENCOUNTER — Ambulatory Visit
Admission: RE | Admit: 2017-06-16 | Discharge: 2017-06-16 | Disposition: A | Discharge status: Home / Self Care | Payer: Medicare Other | Source: Ambulatory Visit | Attending: Internal Medicine | Admitting: Internal Medicine

## 2017-06-16 DIAGNOSIS — R079 Chest pain, unspecified: Secondary | ICD-10-CM | POA: Diagnosis not present

## 2017-06-16 DIAGNOSIS — R0789 Other chest pain: Secondary | ICD-10-CM

## 2017-06-17 ENCOUNTER — Telehealth: Payer: Self-pay | Admitting: Family Medicine

## 2017-06-17 NOTE — Telephone Encounter (Signed)
Copied from [place] [id number]. Topic: Quick Communication - See Telephone Encounter >> Jun 16, 2017  5:08 PM Boyd Kerbs wrote: Patient is calling for results from xrays today

## 2017-06-17 NOTE — Telephone Encounter (Signed)
Please advise 

## 2017-06-22 NOTE — H&P (Signed)
CC: I have blood in my urine.  HPI: Andrew Jennings is a 56 year-old male patient who was referred by Dr. Bluford Kaufmann, MD who is here for blood in the urine.  He did see the blood in his urine. He first noticed the symptoms approximately 04/13/2017. He has not seen blood clots.   He does have a burning sensation when he urinates. He is not currently having trouble urinating.   He is not having pain.   Andrew Jennings is a 56 yo WM who is sent in consultation by Dr. Burnice Logan for an episode of painless gross hematuria in early October. It lasted two days. He had some terminal dysuria. He had no flank pain. He has had some blood remotely. He has had no prior UTI's and has had no GU surgery. He has a long history of ED and is on sildenafil.      ALLERGIES: Darvocet Penicillin Wellbutrin    MEDICATIONS: Crestor  Levothyroxine Sodium 137 mcg tablet  Aspir 81  Benadryl  Haloperidol     GU PSH: None   NON-GU PSH: Hernia Repair Neck Surgery    GU PMH: None   NON-GU PMH: Hypothyroidism Malignant neoplasm of laryngeal cartilage Schizophrenia, unspecified Sleep Apnea    FAMILY HISTORY: Heart Disease - Mother hyperlipidemia - Mother   SOCIAL HISTORY: Marital Status: Single Preferred Language: English; Race: White Current Smoking Status: Patient does not smoke anymore.   Tobacco Use Assessment Completed: Used Tobacco in last 30 days? Does not drink caffeine.     Notes: He smoked up to 10ppd. He quit in 1999.    REVIEW OF SYSTEMS:    GU Review Male:   Patient reports frequent urination, burning/ pain with urination, stream starts and stops, and erection problems. Patient denies hard to postpone urination, get up at night to urinate, leakage of urine, trouble starting your stream, have to strain to urinate , and penile pain.  Gastrointestinal (Upper):   Patient reports nausea. Patient denies vomiting and indigestion/ heartburn.  Gastrointestinal (Lower):   Patient reports  diarrhea and constipation.   Constitutional:   Patient reports night sweats and weight loss. Patient denies fever and fatigue.  Skin:   Patient reports skin rash/ lesion and itching.   Eyes:   Patient reports blurred vision. Patient denies double vision.  Ears/ Nose/ Throat:   Patient reports sore throat and sinus problems.   Hematologic/Lymphatic:   Patient denies swollen glands and easy bruising.  Cardiovascular:   Patient reports chest pains. Patient denies leg swelling.  Respiratory:   Patient reports cough and shortness of breath.   Endocrine:   Patient reports excessive thirst.   Musculoskeletal:   Patient reports back pain. Patient denies joint pain.  Neurological:   Patient reports dizziness. Patient denies headaches.  Psychologic:   Patient reports anxiety. Patient denies depression.   VITAL SIGNS:      05/18/2017 02:13 PM  Weight 173 lb / 78.47 kg  Height 67 in / 170.18 cm  BP 113/76 mmHg  Heart Rate 89 /min  Temperature 97.5 F / 36.3 C  BMI 27.1 kg/m   GU PHYSICAL EXAMINATION:    Anus and Perineum: No hemorrhoids. No anal stenosis. No rectal fissure, no anal fissure. No edema, no dimple, no perineal tenderness, no anal tenderness.  Scrotum: No lesions. No edema. No cysts. No warts.  Epididymides: Right: no spermatocele, no masses, no cysts, no tenderness, no induration, no enlargement. Left: no spermatocele, no masses, no cysts, no tenderness, no induration,  no enlargement.  Testes: No tenderness, no swelling, no enlargement left testes. No tenderness, no swelling, no enlargement right testes. Normal location left testes. Normal location right testes. No mass, no cyst, no varicocele, no hydrocele left testes. No mass, no cyst, no varicocele, no hydrocele right testes.  Urethral Meatus: Normal size. No lesion, no wart, no discharge, no polyp. Normal location.  Penis: Penis uncircumcised. No foreskin warts, no cracks. No dorsal peyronie's plaques, no left corporal peyronie's  plaques, no right corporal peyronie's plaques, no scarring, no shaft warts. No balanitis, no meatal stenosis.   Prostate: Prostate 2 + size. Left lobe normal consistency, right lobe normal consistency. Symmetrical lobes. No prostate nodule. Left lobe no tenderness, right lobe no tenderness.   Seminal Vesicles: Nonpalpable.  Sphincter Tone: Normal sphincter. No rectal tenderness. No rectal mass.    MULTI-SYSTEM PHYSICAL EXAMINATION:    Constitutional: Well-nourished. No physical deformities. Normally developed. Good grooming.  Neck: Neck symmetrical, not swollen. Normal tracheal position.  Respiratory: No labored breathing, no use of accessory muscles. CTA  Cardiovascular: Normal temperature, RRR without murmur  Lymphatic: No enlargement of neck, axillae, groin.  Skin: No paleness, no jaundice, no cyanosis. No lesion, no ulcer, no rash.  Neurologic / Psychiatric: Patient anxious, mildly agitated. Oriented to time, oriented to place, oriented to person. No depression.   Gastrointestinal: Obese abdomen. No hernia. No mass, no tenderness, no rigidity.   Musculoskeletal: Normal gait and station of head and neck.     PAST DATA REVIEWED:  Source Of History:  Patient  Records Review:   Previous Doctor Records  Urine Test Review:   Urinalysis  X-Ray Review: KUB: Reviewed Films. Reviewed Report. Discussed With Patient. from 9/18. No stones seen.     PROCEDURES:           PVR Ultrasound - 30076  Scanned Volume: 71 cc         Urinalysis Dipstick Dipstick Cont'd  Color: Yellow Bilirubin: Neg  Appearance: Clear Ketones: Neg  Specific Gravity: 1.025 Blood: Neg  pH: <=5.0 Protein: Neg  Glucose: Neg Urobilinogen: 0.2    Nitrites: Neg    Leukocyte Esterase: Neg    ASSESSMENT:      ICD-10 Details  1 GU:   Gross hematuria - R31.0 He had an episode of painless gross hematuria and needs evaluation. I am going to set him up for a CT hematuria study and will have him return with the results. If  he needs cystoscopy, that will have to be done under anesthesia as he says he won't have it done without it.    PLAN:           Orders Labs BUN/Creatinine          Schedule X-Rays: C.T. Hematuria With and Without I.V. Contrast - Next available.  Return Visit/Planned Activity: Next Available Appointment - Office Visit   CT was negative.     I will have him return for flexible cystoscopy under sedation per his request.  Risks reviewed.

## 2017-06-23 ENCOUNTER — Encounter (HOSPITAL_BASED_OUTPATIENT_CLINIC_OR_DEPARTMENT_OTHER): Payer: Self-pay | Admitting: Anesthesiology

## 2017-06-23 ENCOUNTER — Encounter (HOSPITAL_BASED_OUTPATIENT_CLINIC_OR_DEPARTMENT_OTHER): Payer: Self-pay

## 2017-06-23 ENCOUNTER — Telehealth: Payer: Self-pay | Admitting: Internal Medicine

## 2017-06-23 ENCOUNTER — Ambulatory Visit (HOSPITAL_BASED_OUTPATIENT_CLINIC_OR_DEPARTMENT_OTHER)
Admission: RE | Admit: 2017-06-23 | Discharge: 2017-06-23 | Disposition: A | Discharge status: Home / Self Care | Payer: Medicare Other | Source: Ambulatory Visit | Attending: Urology | Admitting: Urology

## 2017-06-23 ENCOUNTER — Encounter (HOSPITAL_BASED_OUTPATIENT_CLINIC_OR_DEPARTMENT_OTHER)
Admission: RE | Disposition: A | Discharge status: Home / Self Care | Payer: Self-pay | Source: Ambulatory Visit | Attending: Urology

## 2017-06-23 DIAGNOSIS — Z539 Procedure and treatment not carried out, unspecified reason: Secondary | ICD-10-CM | POA: Diagnosis not present

## 2017-06-23 DIAGNOSIS — Z87891 Personal history of nicotine dependence: Secondary | ICD-10-CM | POA: Diagnosis not present

## 2017-06-23 DIAGNOSIS — E039 Hypothyroidism, unspecified: Secondary | ICD-10-CM | POA: Insufficient documentation

## 2017-06-23 DIAGNOSIS — R319 Hematuria, unspecified: Secondary | ICD-10-CM | POA: Diagnosis present

## 2017-06-23 DIAGNOSIS — R310 Gross hematuria: Secondary | ICD-10-CM | POA: Insufficient documentation

## 2017-06-23 DIAGNOSIS — Z7982 Long term (current) use of aspirin: Secondary | ICD-10-CM | POA: Diagnosis not present

## 2017-06-23 DIAGNOSIS — G4730 Sleep apnea, unspecified: Secondary | ICD-10-CM | POA: Diagnosis not present

## 2017-06-23 DIAGNOSIS — Z8521 Personal history of malignant neoplasm of larynx: Secondary | ICD-10-CM | POA: Diagnosis not present

## 2017-06-23 DIAGNOSIS — Z79899 Other long term (current) drug therapy: Secondary | ICD-10-CM | POA: Diagnosis not present

## 2017-06-23 HISTORY — DX: Gastro-esophageal reflux disease without esophagitis: K21.9

## 2017-06-23 HISTORY — DX: Other intervertebral disc degeneration, lumbar region without mention of lumbar back pain or lower extremity pain: M51.369

## 2017-06-23 HISTORY — DX: Scoliosis, unspecified: M41.9

## 2017-06-23 HISTORY — DX: Other complications of anesthesia, initial encounter: T88.59XA

## 2017-06-23 HISTORY — DX: Adverse effect of unspecified anesthetic, initial encounter: T41.45XA

## 2017-06-23 HISTORY — DX: Other intervertebral disc displacement, lumbar region: M51.26

## 2017-06-23 HISTORY — DX: Other intervertebral disc degeneration, lumbar region: M51.36

## 2017-06-23 SURGERY — CYSTOSCOPY, WITH RETROGRADE PYELOGRAM
Anesthesia: Choice | Laterality: Right

## 2017-06-23 MED ORDER — LACTATED RINGERS IV SOLN
INTRAVENOUS | Status: DC
  Filled 2017-06-23: qty 1000

## 2017-06-23 MED ORDER — CIPROFLOXACIN IN D5W 400 MG/200ML IV SOLN
400.0000 mg | INTRAVENOUS | Status: DC
Start: 2017-06-23 — End: 2017-06-25
  Filled 2017-06-23: qty 200

## 2017-06-23 SURGICAL SUPPLY — 26 items
BAG DRAIN URO-CYSTO SKYTR STRL (DRAIN) ×3 IMPLANT
BAG DRN UROCATH (DRAIN) ×1
BASKET STONE 1.7 NGAGE (UROLOGICAL SUPPLIES) IMPLANT
BASKET ZERO TIP NITINOL 2.4FR (BASKET) IMPLANT
BSKT STON RTRVL ZERO TP 2.4FR (BASKET)
CATH URET 5FR 28IN CONE TIP (BALLOONS)
CATH URET 5FR 28IN OPEN ENDED (CATHETERS) IMPLANT
CATH URET 5FR 70CM CONE TIP (BALLOONS) IMPLANT
CLOTH BEACON ORANGE TIMEOUT ST (SAFETY) ×3 IMPLANT
ELECT REM PT RETURN 9FT ADLT (ELECTROSURGICAL)
ELECTRODE REM PT RTRN 9FT ADLT (ELECTROSURGICAL) IMPLANT
FIBER LASER FLEXIVA 365 (UROLOGICAL SUPPLIES) IMPLANT
FIBER LASER TRAC TIP (UROLOGICAL SUPPLIES) IMPLANT
GLOVE SURG SS PI 8.0 STRL IVOR (GLOVE) ×3 IMPLANT
GOWN STRL REUS W/TWL XL LVL3 (GOWN DISPOSABLE) ×3 IMPLANT
GUIDEWIRE 0.038 PTFE COATED (WIRE) IMPLANT
GUIDEWIRE ANG ZIPWIRE 038X150 (WIRE) IMPLANT
GUIDEWIRE STR DUAL SENSOR (WIRE) ×3 IMPLANT
INFUSOR MANOMETER BAG 3000ML (MISCELLANEOUS) ×3 IMPLANT
IV NS IRRIG 3000ML ARTHROMATIC (IV SOLUTION) ×3 IMPLANT
KIT RM TURNOVER CYSTO AR (KITS) ×3 IMPLANT
MANIFOLD NEPTUNE II (INSTRUMENTS) ×3 IMPLANT
NS IRRIG 500ML POUR BTL (IV SOLUTION) ×3 IMPLANT
PACK CYSTO (CUSTOM PROCEDURE TRAY) ×3 IMPLANT
TUBE CONNECTING 12'X1/4 (SUCTIONS)
TUBE CONNECTING 12X1/4 (SUCTIONS) IMPLANT

## 2017-06-23 NOTE — Interval H&P Note (Deleted)
History and Physical Interval Note:  06/23/2017 10:24 AM  Andrew Jennings  has presented today for surgery, with the diagnosis of hematuria  The various methods of treatment have been discussed with the patient and family. After consideration of risks, benefits and other options for treatment, the patient has consented to  Procedure(s): CYSTOSCOPY WITH RIGHT RETROGRADE (Right) as a surgical intervention .  The patient's history has been reviewed, patient examined, no change in status, stable for surgery.  I have reviewed the patient's chart and labs.  Questions were answered to the patient's satisfaction.     [name]

## 2017-06-23 NOTE — Interval H&P Note (Signed)
History and Physical Interval Note:  He has been having some CP with activity and has an ascending aortic aneurysm.  Dr. Ermalene Postin feels he should be reevaluated by Dr. Acie Fredrickson prior to proceeding.  I will message Dr. Acie Fredrickson.  06/23/2017 10:29 AM  Andrew Jennings  has presented today for surgery, with the diagnosis of hematuria  The various methods of treatment have been discussed with the patient and family. After consideration of risks, benefits and other options for treatment, the patient has consented to  Procedure(s): CYSTOSCOPY WITH RIGHT RETROGRADE (Right) as a surgical intervention .  The patient's history has been reviewed, patient examined, no change in status, stable for surgery.  I have reviewed the patient's chart and labs.  Questions were answered to the patient's satisfaction.     [name]

## 2017-06-23 NOTE — Telephone Encounter (Signed)
Copied from [place] [id number]. Topic: General - Other >> Jun 23, 2017  5:32 PM Darl Householder, RMA wrote: Reason for CRM: Medication refill request for Sildenafil 20 mg to be sent to [facility]

## 2017-06-24 ENCOUNTER — Telehealth: Payer: Self-pay | Admitting: Nurse Practitioner

## 2017-06-24 ENCOUNTER — Telehealth: Payer: Self-pay | Admitting: Cardiovascular Disease

## 2017-06-24 DIAGNOSIS — E785 Hyperlipidemia, unspecified: Secondary | ICD-10-CM | POA: Diagnosis not present

## 2017-06-24 DIAGNOSIS — G4733 Obstructive sleep apnea (adult) (pediatric): Secondary | ICD-10-CM | POA: Diagnosis not present

## 2017-06-24 DIAGNOSIS — R079 Chest pain, unspecified: Secondary | ICD-10-CM | POA: Diagnosis not present

## 2017-06-24 DIAGNOSIS — E039 Hypothyroidism, unspecified: Secondary | ICD-10-CM | POA: Diagnosis not present

## 2017-06-24 MED ORDER — SILDENAFIL CITRATE 20 MG PO TABS: ORAL_TABLET | ORAL | 0 refills | Status: DC

## 2017-06-24 NOTE — Telephone Encounter (Signed)
Sildenafil last filled 06/11/17, #20. Okay to refill again so soon?

## 2017-06-24 NOTE — Telephone Encounter (Signed)
Okay for refill?  

## 2017-06-24 NOTE — Telephone Encounter (Signed)
New message ° °Pt verbalized that he is returning call for the rn  °

## 2017-06-24 NOTE — Telephone Encounter (Signed)
Hi Andrew Jennings,   His ascending aorta is only minimally dilated and was stable as of Sept. Of this year.  We will call and discuss with him and get him in if he needs to be seen.  I'll be back in touch with you after we have sorted this out   Phil    Previous Messages    ----- Message -----  From: [name], MD  Sent: 06/23/2017 10:30 AM  To: Thayer Headings, MD   Phil,    I was supposed to do a Cystoscopy and retrograde pyelogram for a hematuria w/u on Andrew Jennings today, but he has been having some chest pain with certain activities and Dr. Ermalene Postin with anesthesia feels he needs to be reevaluated for that prior to having an anesthetic because of his history of an Ascending Aortic Aneurysm. Can you facilitate that.   Thanks,   Andrew Jennings      Dr. Acie Fredrickson called patient to discuss his chest pain. Patient states he cannot repeat the myocardial perfusion stress test like he had in the past due to the way it made him feel. Dr. Acie Fredrickson asked me to arrange a GXT to evaluate patient's chest pain. I left messages on patient's cell and home phones asking him to call back.

## 2017-06-24 NOTE — Telephone Encounter (Signed)
I spoke with patient and he states he would rather have the chemical stress test rather than walk on the treadmill. Dr. Acie Fredrickson is in agreement with this plan and I advised our office will call him back to schedule this test. He verbalized understanding and thanked me for the call.

## 2017-06-24 NOTE — Telephone Encounter (Signed)
Medication filled to pharmacy as requested.   

## 2017-06-24 NOTE — Telephone Encounter (Signed)
Last appointment with Dr Inda Merlin 06/11/17; states medication needs further review  Sildenafil 20 mg

## 2017-06-24 NOTE — Telephone Encounter (Signed)
° °  [facility] Pre-operative Risk Assessment    Request for surgical clearance:  1. What type of surgery is being performed? Cystoscopy Rt Retrogrades   2. When is this surgery scheduled? Pending  3. Are there any medications that need to be held prior to surgery and how long?General Cardiac Clearnance-Pt was having Chest Pains Yesterday and surgery was postponed   4. Practice name and name of physician performing surgery? Dr [name]  5. What is your office phone and fax number? [phone] and fax is [phone]   6. Anesthesia type (None, local, MAC, general) ? General  Glyn Ade 06/24/2017, 1:20 PM  _________________________________________________________________   (provider comments below)

## 2017-06-25 ENCOUNTER — Telehealth (HOSPITAL_COMMUNITY): Payer: Self-pay | Admitting: Cardiovascular Disease

## 2017-06-25 ENCOUNTER — Encounter (INDEPENDENT_AMBULATORY_CARE_PROVIDER_SITE_OTHER): Payer: Medicare Other

## 2017-06-25 DIAGNOSIS — R079 Chest pain, unspecified: Secondary | ICD-10-CM

## 2017-06-25 LAB — EXERCISE TOLERANCE TEST
CHL CUP RESTING HR STRESS: 78 {beats}/min
CHL RATE OF PERCEIVED EXERTION: 17
CSEPEDS: 39 s
CSEPEW: 9.1 METS
Exercise duration (min): 7 min
MPHR: 162 {beats}/min
Peak HR: 141 {beats}/min
Percent HR: 87 %

## 2017-06-25 NOTE — Telephone Encounter (Signed)
Patient was planned to undergo cystoscopy on 12/12 which was cancelled due to chest pain. The treating MD contacted Dr. Acie Fredrickson who has ordered a stress test to further evaluate prior to providing clearance. This stress test is to be completed on 12/13. Awaiting results of this stress test prior to providing disposition.

## 2017-06-25 NOTE — Telephone Encounter (Signed)
I called and spoke with patient to get him scheduled for a Nuclear stress test. Immediately he voiced that he needed an afternoon appt. I informed him that the latest time we would be able to do the test in Dr. Elmarie Shiley office was 10:00am on 12/18. He stated that he needed something later so I offered him and appt in our [place] office at 1:15pm. He stated that he was fine with that. As I proceeded to give him instruction as far as him needing to be NPO 3 hrs prior to in addition to the test taking approximately 3 1/2 to 4hrs he lashed out stating that he will not be in a doctor's office that long. After calming him down , he agreed to having the ETT today at 3:00pm.

## 2017-06-26 ENCOUNTER — Telehealth: Payer: Self-pay | Admitting: Cardiovascular Disease

## 2017-06-26 ENCOUNTER — Other Ambulatory Visit: Payer: Self-pay | Admitting: Urology

## 2017-06-26 NOTE — Telephone Encounter (Signed)
Returning your call  .. Thanks  °

## 2017-06-26 NOTE — Telephone Encounter (Signed)
Spoke with patient who called to ask why he is hurting in his back. He states it hurts in his back and his chest when he urinates. I asked the reason that he needs a cystoscopy and he states there is a concern for bladder cancer. I advised that his pain could be coming from his bladder or urinary tract. He states he is worried about the aortic aneurysm and I advised that it is not a large aneurysm and that it has not changed since 7/15. He verbalized understanding and thanked me for the call.

## 2017-06-26 NOTE — Telephone Encounter (Signed)
New message ° °Pt verbalized that he is returning call for the rn  °

## 2017-06-26 NOTE — Telephone Encounter (Signed)
Left message for patient to call back  

## 2017-06-26 NOTE — Telephone Encounter (Signed)
   Primary Cardiologist: Dr Acie Fredrickson  Chart reviewed as part of pre-operative protocol coverage. Given past medical history and time since last visit, based on ACC/AHA guidelines, Andrew Jennings would be at acceptable risk for the planned procedure without further cardiovascular testing.   He was evaluated for chest pain with an exercise tolerance test on 06/25/17 which was normal without any evidence of ischemia. His chest pain is atypical and felt to be non-cardiac. His ascending aortic aneurism is small and has been stable by serial CT angiograms  He is at low risk for his urology procedure.  I will route this recommendation to the requesting party via Epic fax function and remove from pre-op pool.  Please call with questions.  Daune Perch, NP 06/26/2017, 2:34 PM

## 2017-06-26 NOTE — Telephone Encounter (Signed)
Reviewed results of ETT with patient who verbalized understanding. I discussed other possible causes for chest pain with the patient and advised him that Dr. Acie Fredrickson cleared him for his urologic procedure. Patient thanked me for the call.

## 2017-06-26 NOTE — Telephone Encounter (Signed)
New message    Patient calling for results of ETT. Please call

## 2017-07-09 ENCOUNTER — Telehealth: Payer: Self-pay | Admitting: Internal Medicine

## 2017-07-09 NOTE — Telephone Encounter (Signed)
Copied from [place] [id number]. Topic: Quick Communication - Rx Refill/Question >> Jul 09, 2017  4:16 PM Tye [place] wrote: Pt needs sildenafil (REVATIO) 20 MG tablet [[id number]] sent to his pharmacy, contact pt or pharmacy if needed

## 2017-07-10 NOTE — Telephone Encounter (Signed)
Patient sildenafil was last refilled 06/24/2017 with 20 tabs w/zero refills. Patient is requesting a refill for medication. Please advise if okay to refill in Dr. Burnice Logan absence. Thank you.

## 2017-07-10 NOTE — Telephone Encounter (Signed)
Ok to refill, 20 tabs, 0 refills

## 2017-07-13 ENCOUNTER — Other Ambulatory Visit: Payer: Self-pay | Admitting: Emergency Medicine

## 2017-07-13 MED ORDER — SILDENAFIL CITRATE 20 MG PO TABS: ORAL_TABLET | ORAL | 0 refills | Status: DC

## 2017-07-13 NOTE — Telephone Encounter (Signed)
Medication has been sent to patient pharmacy. Nothing further needed.

## 2017-07-16 DIAGNOSIS — R310 Gross hematuria: Secondary | ICD-10-CM | POA: Diagnosis not present

## 2017-07-20 DIAGNOSIS — F200 Paranoid schizophrenia: Secondary | ICD-10-CM | POA: Diagnosis not present

## 2017-07-21 ENCOUNTER — Other Ambulatory Visit: Payer: Self-pay

## 2017-07-21 ENCOUNTER — Encounter (HOSPITAL_BASED_OUTPATIENT_CLINIC_OR_DEPARTMENT_OTHER): Payer: Self-pay | Admitting: *Deleted

## 2017-07-21 NOTE — Progress Notes (Signed)
SPOKE W/ PT VIA PHONE FOR PRE-OP INTERVIEW.  NPO AFTER MN W/ EXCEPTION CLEAR LIQUIDS UNTIL 0615, PT VERBALIZED UNDERSTANDING.  ARRIVE AT 1015.  NEEDS ISTAT.  CURRENT EKG IN CHART AND Epic.  THIS WAS A RESCHEDULE FROM 06-23-2017, PT WAS CANCELLED DUE TO PT COMPLAINED OF CHEST PAIN.  PT HAD CARDIAC CLEARANCE AND IS W/ CHART.

## 2017-07-22 NOTE — H&P (Signed)
CC: I have blood in my urine.  HPI: Andrew Jennings is a 57 year-old male established patient who is here for blood in the urine.  He did see the blood in his urine. He first noticed the symptoms approximately 04/13/2017. He has not seen blood clots.   He does have a burning sensation when he urinates. He is not currently having trouble urinating.   He is not having pain.   Andrew Jennings returns in f/u. He had to have his cystoscopy cancelled because of CP but has been cleared by Dr. Acie Fredrickson. He has had no more hematuria.   He was originally sent in consultation for an episode of painless gross hematuria in early October. It lasted two days. He had some terminal dysuria. He had no flank pain. He has had some blood remotely. He has had no prior UTI's and has had no GU surgery. He has a long history of ED and is on sildenafil.     ALLERGIES: Darvocet Penicillin Wellbutrin    MEDICATIONS: Crestor  Levothyroxine Sodium 137 mcg tablet  Aspir 81  Benadryl  Haloperidol     GU PSH: Locm 300-399Mg /Ml Iodine,1Ml - 05/29/2017    NON-GU PSH: Hernia Repair Neck Surgery    GU PMH: Gross hematuria, He had an episode of painless gross hematuria and needs evaluation. I am going to set him up for a CT hematuria study and will have him return with the results. If he needs cystoscopy, that will have to be done under anesthesia as he says he won't have it done without it. - 05/18/2017    NON-GU PMH: Hypothyroidism Malignant neoplasm of laryngeal cartilage Schizophrenia, unspecified Sleep Apnea    FAMILY HISTORY: Heart Disease - Mother hyperlipidemia - Mother   SOCIAL HISTORY: Marital Status: Single Preferred Language: English; Race: White Current Smoking Status: Patient does not smoke anymore.   Tobacco Use Assessment Completed: Used Tobacco in last 30 days? Does not drink caffeine.     Notes: He smoked up to 10ppd. He quit in 1999.    REVIEW OF SYSTEMS:    GU Review Male:   Patient denies  frequent urination, hard to postpone urination, burning/ pain with urination, get up at night to urinate, leakage of urine, stream starts and stops, trouble starting your stream, have to strain to urinate , erection problems, and penile pain.  Gastrointestinal (Upper):   Patient denies nausea, vomiting, and indigestion/ heartburn.  Gastrointestinal (Lower):   Patient denies diarrhea and constipation.  Constitutional:   Patient denies fever, night sweats, weight loss, and fatigue.  Skin:   Patient denies skin rash/ lesion and itching.  Eyes:   Patient denies blurred vision and double vision.  Ears/ Nose/ Throat:   Patient denies sore throat and sinus problems.  Hematologic/Lymphatic:   Patient denies swollen glands and easy bruising.  Cardiovascular:   Patient denies leg swelling and chest pains.  Respiratory:   Patient denies cough and shortness of breath.  Endocrine:   Patient denies excessive thirst.  Musculoskeletal:   Patient denies back pain and joint pain.  Neurological:   Patient denies headaches and dizziness.  Psychologic:   Patient denies depression and anxiety.   VITAL SIGNS:      07/16/2017 02:41 PM  BP 115/80 mmHg  Pulse 82 /min  Temperature 97.8 F / 36.5 C   MULTI-SYSTEM PHYSICAL EXAMINATION:    Constitutional: Well-nourished. No physical deformities. Normally developed. Good grooming.  Respiratory: No labored breathing, no use of accessory muscles. CTA  Cardiovascular:  Normal temperature, RRR without murmur     PAST DATA REVIEWED:  Source Of History:  Patient  Urine Test Review:   Urinalysis  X-Ray Review: C.T. Hematuria: Reviewed Films. Reviewed Report. Discussed With Patient. large prostate but no other GU findings.     PROCEDURES:          Urinalysis Dipstick Dipstick Cont'd  Color: Yellow Bilirubin: Neg  Appearance: Clear Ketones: Neg  Specific Gravity: 1.025 Blood: Neg  pH: 5.5 Protein: Neg  Glucose: Neg Urobilinogen: 0.2    Nitrites: Neg    Leukocyte  Esterase: Neg    ASSESSMENT:      ICD-10 Details  1 GU:   Gross hematuria - R31.0 He has had no further hematuria but needs to proceed with cystoscopy as planned on 07/23/17.   PLAN:           Schedule Return Visit/Planned Activity: Keep Scheduled Appointment             Note: He is on for outpatient cystoscopy on 07/23/17.

## 2017-07-23 ENCOUNTER — Ambulatory Visit (HOSPITAL_BASED_OUTPATIENT_CLINIC_OR_DEPARTMENT_OTHER)
Admission: RE | Admit: 2017-07-23 | Discharge: 2017-07-23 | Disposition: A | Discharge status: Home / Self Care | Payer: Medicare HMO | Source: Ambulatory Visit | Attending: Urology | Admitting: Urology

## 2017-07-23 ENCOUNTER — Encounter (HOSPITAL_BASED_OUTPATIENT_CLINIC_OR_DEPARTMENT_OTHER)
Admission: RE | Disposition: A | Discharge status: Home / Self Care | Payer: Self-pay | Source: Ambulatory Visit | Attending: Urology

## 2017-07-23 ENCOUNTER — Ambulatory Visit (HOSPITAL_BASED_OUTPATIENT_CLINIC_OR_DEPARTMENT_OTHER): Payer: Medicare HMO | Admitting: Anesthesiology

## 2017-07-23 DIAGNOSIS — N400 Enlarged prostate without lower urinary tract symptoms: Secondary | ICD-10-CM | POA: Diagnosis not present

## 2017-07-23 DIAGNOSIS — E785 Hyperlipidemia, unspecified: Secondary | ICD-10-CM | POA: Diagnosis not present

## 2017-07-23 DIAGNOSIS — E039 Hypothyroidism, unspecified: Secondary | ICD-10-CM | POA: Diagnosis not present

## 2017-07-23 DIAGNOSIS — Z888 Allergy status to other drugs, medicaments and biological substances status: Secondary | ICD-10-CM | POA: Insufficient documentation

## 2017-07-23 DIAGNOSIS — G4730 Sleep apnea, unspecified: Secondary | ICD-10-CM | POA: Diagnosis not present

## 2017-07-23 DIAGNOSIS — Z87891 Personal history of nicotine dependence: Secondary | ICD-10-CM | POA: Diagnosis not present

## 2017-07-23 DIAGNOSIS — Z7982 Long term (current) use of aspirin: Secondary | ICD-10-CM | POA: Insufficient documentation

## 2017-07-23 DIAGNOSIS — Z8521 Personal history of malignant neoplasm of larynx: Secondary | ICD-10-CM | POA: Insufficient documentation

## 2017-07-23 DIAGNOSIS — Z880 Allergy status to penicillin: Secondary | ICD-10-CM | POA: Insufficient documentation

## 2017-07-23 DIAGNOSIS — F319 Bipolar disorder, unspecified: Secondary | ICD-10-CM | POA: Diagnosis not present

## 2017-07-23 DIAGNOSIS — Z885 Allergy status to narcotic agent status: Secondary | ICD-10-CM | POA: Diagnosis not present

## 2017-07-23 DIAGNOSIS — R310 Gross hematuria: Secondary | ICD-10-CM | POA: Insufficient documentation

## 2017-07-23 DIAGNOSIS — Z79899 Other long term (current) drug therapy: Secondary | ICD-10-CM | POA: Diagnosis not present

## 2017-07-23 DIAGNOSIS — F209 Schizophrenia, unspecified: Secondary | ICD-10-CM | POA: Insufficient documentation

## 2017-07-23 DIAGNOSIS — Z8249 Family history of ischemic heart disease and other diseases of the circulatory system: Secondary | ICD-10-CM | POA: Insufficient documentation

## 2017-07-23 HISTORY — DX: Personal history of other medical treatment: Z92.89

## 2017-07-23 HISTORY — DX: Personal history of adenomatous and serrated colon polyps: Z86.0101

## 2017-07-23 HISTORY — DX: Personal history of irradiation: Z92.3

## 2017-07-23 HISTORY — DX: Personal history of colonic polyps: Z86.010

## 2017-07-23 HISTORY — PX: CYSTOSCOPY/RETROGRADE/URETEROSCOPY: SHX5316

## 2017-07-23 HISTORY — DX: Hypothyroidism, unspecified: E03.9

## 2017-07-23 HISTORY — DX: Personal history of malignant neoplasm of larynx: Z85.21

## 2017-07-23 HISTORY — DX: Obstructive sleep apnea (adult) (pediatric): G47.33

## 2017-07-23 LAB — POCT I-STAT 4, (NA,K, GLUC, HGB,HCT)
Glucose, Bld: 93 mg/dL (ref 65–99)
HCT: 49 % (ref 39.0–52.0)
Hemoglobin: 16.7 g/dL (ref 13.0–17.0)
Potassium: 4.1 mmol/L (ref 3.5–5.1)
Sodium: 142 mmol/L (ref 135–145)

## 2017-07-23 SURGERY — CYSTOSCOPY/RETROGRADE/URETEROSCOPY
Anesthesia: General | Site: Bladder | Laterality: Right

## 2017-07-23 MED ORDER — FENTANYL CITRATE (PF) 100 MCG/2ML IJ SOLN
INTRAMUSCULAR | Status: AC
  Filled 2017-07-23: qty 2

## 2017-07-23 MED ORDER — DEXAMETHASONE SODIUM PHOSPHATE 10 MG/ML IJ SOLN
INTRAMUSCULAR | Status: DC | PRN
  Administered 2017-07-23: 10 mg via INTRAVENOUS

## 2017-07-23 MED ORDER — SODIUM CHLORIDE 0.9% FLUSH
3.0000 mL | Freq: Two times a day (BID) | INTRAVENOUS | Status: DC
  Filled 2017-07-23: qty 3

## 2017-07-23 MED ORDER — PROPOFOL 500 MG/50ML IV EMUL
INTRAVENOUS | Status: AC
  Filled 2017-07-23: qty 50

## 2017-07-23 MED ORDER — ONDANSETRON HCL 4 MG/2ML IJ SOLN
INTRAMUSCULAR | Status: DC | PRN
  Administered 2017-07-23: 4 mg via INTRAVENOUS

## 2017-07-23 MED ORDER — CIPROFLOXACIN IN D5W 400 MG/200ML IV SOLN
INTRAVENOUS | Status: AC
  Filled 2017-07-23: qty 200

## 2017-07-23 MED ORDER — ONDANSETRON HCL 4 MG/2ML IJ SOLN
4.0000 mg | Freq: Once | INTRAMUSCULAR | Status: DC | PRN
  Filled 2017-07-23: qty 2

## 2017-07-23 MED ORDER — SODIUM CHLORIDE 0.9% FLUSH
3.0000 mL | INTRAVENOUS | Status: DC | PRN
Start: 2017-07-23 — End: 2017-07-23
  Filled 2017-07-23: qty 3

## 2017-07-23 MED ORDER — MIDAZOLAM HCL 2 MG/2ML IJ SOLN
INTRAMUSCULAR | Status: AC
  Filled 2017-07-23: qty 2

## 2017-07-23 MED ORDER — LIDOCAINE HCL (CARDIAC) 20 MG/ML IV SOLN
INTRAVENOUS | Status: DC | PRN
  Administered 2017-07-23: 30 mg via INTRAVENOUS

## 2017-07-23 MED ORDER — FENTANYL CITRATE (PF) 100 MCG/2ML IJ SOLN
25.0000 ug | INTRAMUSCULAR | Status: DC | PRN
  Filled 2017-07-23: qty 1

## 2017-07-23 MED ORDER — CIPROFLOXACIN IN D5W 400 MG/200ML IV SOLN
400.0000 mg | INTRAVENOUS | Status: AC
  Administered 2017-07-23: 400 mg via INTRAVENOUS
  Filled 2017-07-23: qty 200

## 2017-07-23 MED ORDER — FENTANYL CITRATE (PF) 100 MCG/2ML IJ SOLN
INTRAMUSCULAR | Status: DC | PRN
  Administered 2017-07-23: 50 ug via INTRAVENOUS

## 2017-07-23 MED ORDER — PROPOFOL 10 MG/ML IV BOLUS
INTRAVENOUS | Status: DC | PRN
  Administered 2017-07-23: 40 mg via INTRAVENOUS
  Administered 2017-07-23: 100 mg via INTRAVENOUS
  Administered 2017-07-23: 160 mg via INTRAVENOUS

## 2017-07-23 MED ORDER — SODIUM CHLORIDE 0.9 % IV SOLN
250.0000 mL | INTRAVENOUS | Status: DC | PRN
  Filled 2017-07-23: qty 250

## 2017-07-23 MED ORDER — ONDANSETRON HCL 4 MG/2ML IJ SOLN
INTRAMUSCULAR | Status: AC
  Filled 2017-07-23: qty 2

## 2017-07-23 MED ORDER — DEXAMETHASONE SODIUM PHOSPHATE 10 MG/ML IJ SOLN
INTRAMUSCULAR | Status: AC
  Filled 2017-07-23: qty 1

## 2017-07-23 MED ORDER — LIDOCAINE 2% (20 MG/ML) 5 ML SYRINGE
INTRAMUSCULAR | Status: AC
  Filled 2017-07-23: qty 5

## 2017-07-23 MED ORDER — PROPOFOL 10 MG/ML IV BOLUS
INTRAVENOUS | Status: AC
  Filled 2017-07-23: qty 20

## 2017-07-23 MED ORDER — PHENAZOPYRIDINE HCL 200 MG PO TABS: 200.0000 mg | ORAL_TABLET | Freq: Three times a day (TID) | ORAL | 0 refills | Status: DC | PRN

## 2017-07-23 MED ORDER — STERILE WATER FOR IRRIGATION IR SOLN
Status: DC | PRN
  Administered 2017-07-23: 3000 mL

## 2017-07-23 MED ORDER — OXYCODONE HCL 5 MG PO TABS
5.0000 mg | ORAL_TABLET | ORAL | Status: DC | PRN
  Filled 2017-07-23: qty 2

## 2017-07-23 MED ORDER — MORPHINE SULFATE (PF) 2 MG/ML IV SOLN
2.0000 mg | INTRAVENOUS | Status: DC | PRN
  Filled 2017-07-23: qty 1

## 2017-07-23 MED ORDER — LACTATED RINGERS IV SOLN
INTRAVENOUS | Status: DC
  Administered 2017-07-23: 1000 mL via INTRAVENOUS
  Filled 2017-07-23: qty 1000

## 2017-07-23 MED ORDER — MIDAZOLAM HCL 2 MG/2ML IJ SOLN
INTRAMUSCULAR | Status: DC | PRN
  Administered 2017-07-23 (×2): 1 mg via INTRAVENOUS

## 2017-07-23 MED ORDER — IOHEXOL 300 MG/ML  SOLN
INTRAMUSCULAR | Status: DC | PRN
  Administered 2017-07-23: 7 mL

## 2017-07-23 SURGICAL SUPPLY — 23 items
BAG DRAIN URO-CYSTO SKYTR STRL (DRAIN) ×3 IMPLANT
BAG DRN UROCATH (DRAIN) ×1
BASKET ZERO TIP NITINOL 2.4FR (BASKET) IMPLANT
BSKT STON RTRVL ZERO TP 2.4FR (BASKET)
CATH URET 5FR 28IN OPEN ENDED (CATHETERS) ×2 IMPLANT
CLOTH BEACON ORANGE TIMEOUT ST (SAFETY) ×3 IMPLANT
ELECT REM PT RETURN 9FT ADLT (ELECTROSURGICAL)
ELECTRODE REM PT RTRN 9FT ADLT (ELECTROSURGICAL) IMPLANT
GLOVE BIO SURGEON STRL SZ 6.5 (GLOVE) ×1 IMPLANT
GLOVE BIO SURGEONS STRL SZ 6.5 (GLOVE) ×1
GLOVE BIOGEL PI IND STRL 6.5 (GLOVE) IMPLANT
GLOVE BIOGEL PI INDICATOR 6.5 (GLOVE) ×2
GLOVE SURG SS PI 8.0 STRL IVOR (GLOVE) ×3 IMPLANT
GOWN STRL REUS W/TWL LRG LVL3 (GOWN DISPOSABLE) ×2 IMPLANT
GOWN STRL REUS W/TWL XL LVL3 (GOWN DISPOSABLE) ×3 IMPLANT
GUIDEWIRE STR DUAL SENSOR (WIRE) ×1 IMPLANT
KIT RM TURNOVER CYSTO AR (KITS) ×3 IMPLANT
MANIFOLD NEPTUNE II (INSTRUMENTS) ×3 IMPLANT
NS IRRIG 500ML POUR BTL (IV SOLUTION) ×3 IMPLANT
PACK CYSTO (CUSTOM PROCEDURE TRAY) ×3 IMPLANT
TUBE CONNECTING 12'X1/4 (SUCTIONS) ×1
TUBE CONNECTING 12X1/4 (SUCTIONS) ×1 IMPLANT
WATER STERILE IRR 3000ML UROMA (IV SOLUTION) ×2 IMPLANT

## 2017-07-23 NOTE — Interval H&P Note (Signed)
History and Physical Interval Note:  07/23/2017 11:02 AM  Andrew Jennings  has presented today for surgery, with the diagnosis of HEMATURIA  The various methods of treatment have been discussed with the patient and family. After consideration of risks, benefits and other options for treatment, the patient has consented to  Procedure(s): CYSTOSCOPY RIGHT RETROGRADE (Right) as a surgical intervention .  The patient's history has been reviewed, patient examined, no change in status, stable for surgery.  I have reviewed the patient's chart and labs.  Questions were answered to the patient's satisfaction.     [name]

## 2017-07-23 NOTE — Anesthesia Procedure Notes (Signed)
Procedure Name: LMA Insertion Date/Time: 07/23/2017 11:28 AM Performed by: Asher Muir, CRNA Pre-anesthesia Checklist: Patient being monitored, Patient identified, Emergency Drugs available and Suction available Patient Re-evaluated:Patient Re-evaluated prior to induction Oxygen Delivery Method: Circle system utilized Preoxygenation: Pre-oxygenation with 100% oxygen Induction Type: IV induction and Inhalational induction Ventilation: Mask ventilation without difficulty LMA: LMA inserted LMA Size: 4.0 Number of attempts: 1 Dental Injury: Teeth and Oropharynx as per pre-operative assessment

## 2017-07-23 NOTE — Anesthesia Preprocedure Evaluation (Addendum)
Anesthesia Evaluation  Patient identified by MRN, date of birth, ID band Patient awake    Reviewed: Allergy & Precautions, NPO status , Patient's Chart, lab work & pertinent test results  Airway Mallampati: II  TM Distance: >3 FB Neck ROM: Full    Dental  (+) Poor Dentition,    Pulmonary sleep apnea , former smoker,    Pulmonary exam normal breath sounds clear to auscultation       Cardiovascular negative cardio ROS Normal cardiovascular exam Rhythm:Regular Rate:Normal  ECG: SR, rate 71  Normal stress test without any evidence of ischemia His CP is atypical. His ascending aortic aneurism is small and has been stable by serial CT angiograms He is at low risk for his urology procedure  copy of this has been sent to Dr. Jeffie Pollock, Urology   Neuro/Psych PSYCHIATRIC DISORDERS Depression Bipolar Disorder Schizophrenia negative neurological ROS     GI/Hepatic Neg liver ROS,   Endo/Other  Hypothyroidism   Renal/GU negative Renal ROS   HEMATURIA    Musculoskeletal negative musculoskeletal ROS (+)   Abdominal   Peds  Hematology HLD   Anesthesia Other Findings   Reproductive/Obstetrics                            Anesthesia Physical Anesthesia Plan  ASA: III  Anesthesia Plan: General   Post-op Pain Management:    Induction: Intravenous  PONV Risk Score and Plan: 2 and Dexamethasone and Ondansetron  Airway Management Planned: LMA  Additional Equipment:   Intra-op Plan:   Post-operative Plan: Extubation in OR  Informed Consent: I have reviewed the patients History and Physical, chart, labs and discussed the procedure including the risks, benefits and alternatives for the proposed anesthesia with the patient or authorized representative who has indicated his/her understanding and acceptance.   Dental advisory given  Plan Discussed with: CRNA  Anesthesia Plan Comments:         Anesthesia Quick Evaluation

## 2017-07-23 NOTE — Transfer of Care (Signed)
Immediate Anesthesia Transfer of Care Note  Patient: Andrew Jennings  Procedure(s) Performed: CYSTOSCOPY RIGHT RETROGRADE (Right Bladder)  Patient Location: PACU  Anesthesia Type:General  Level of Consciousness: sedated  Airway & Oxygen Therapy: Patient Spontanous Breathing and Patient connected to nasal cannula oxygen  Post-op Assessment: Report given to RN  Post vital signs: Reviewed and stable  Last Vitals:  Vitals:   07/23/17 0942  BP: 113/82  Pulse: 76  Resp: 16  Temp: (!) 36.4 C  SpO2: 95%    Last Pain:  Vitals:   07/23/17 1021  TempSrc:   PainSc: 1       Patients Stated Pain Goal: 5 (51/46/04 [id number])  Complications: No apparent anesthesia complications

## 2017-07-23 NOTE — Discharge Instructions (Addendum)
°  Post Anesthesia Home Care Instructions  Activity: Get plenty of rest for the remainder of the day. A responsible individual must stay with you for 24 hours following the procedure.  For the next 24 hours, DO NOT: -Drive a car -Paediatric nurse -Drink alcoholic beverages -Take any medication unless instructed by your physician -Make any legal decisions or sign important papers.  Meals: Start with liquid foods such as gelatin or soup. Progress to regular foods as tolerated. Avoid greasy, spicy, heavy foods. If nausea and/or vomiting occur, drink only clear liquids until the nausea and/or vomiting subsides. Call your physician if vomiting continues.  Special Instructions/Symptoms: Your throat may feel dry or sore from the anesthesia or the breathing tube placed in your throat during surgery. If this causes discomfort, gargle with warm salt water. The discomfort should disappear within 24 hours.  If you had a scopolamine patch placed behind your ear for the management of post- operative nausea and/or vomiting:  1. The medication in the patch is effective for 72 hours, after which it should be removed.  Wrap patch in a tissue and discard in the trash. Wash hands thoroughly with soap and water. 2. You may remove the patch earlier than 72 hours if you experience unpleasant side effects which may include dry mouth, dizziness or visual disturbances. 3. Avoid touching the patch. Wash your hands with soap and water after contact with the patch.      CYSTOSCOPY HOME CARE INSTRUCTIONS  Activity: Rest for the remainder of the day.  Do not drive or operate equipment today.  You may resume normal activities in one to two days as instructed by your physician.   Meals: Drink plenty of liquids and eat light foods such as gelatin or soup this evening.  You may return to a normal meal plan tomorrow.  Return to Work: You may return to work in one to two days or as instructed by your  physician.  Special Instructions / Symptoms: Call your physician if any of these symptoms occur:   -persistent or heavy bleeding  -bleeding which continues after first few urination  -large blood clots that are difficult to pass  -urine stream diminishes or stops completely  -fever equal to or higher than 101 degrees Farenheit.  -cloudy urine with a strong, foul odor  -severe pain   You may feel some burning pain when you urinate.  This should disappear with time.  Applying moist heat to the lower abdomen or a hot tub bath may help relieve the pain. \  I noticed that you are taking 300mg  of benadryl.  Please consider reducing his dose as it is well above the recommended dose of 50mg .

## 2017-07-23 NOTE — Op Note (Signed)
Procedure: Cystoscopy with right retrograde pyelogram and interpretation.  Preop diagnosis: Gross hematuria.  Postoperative diagnosis: Gross hematuria with negative workup.  Surgeon: Dr. [name].  Anesthesia: General.  Specimen: None.  Drain: None.  EBL: None.  Complication: None.  Indications: This is a 57 year old white male who had an episode of gross hematuria.  He had a CT scan that revealed no upper tract abnormalities but the right  distal ureter was not completely visualized.  He declined cystoscopy in the office and requested anesthesia.  Procedure: He was taken to the operating room where he was given Cipro 400 mg IV.  A general anesthetic was induced.  He was placed in the lithotomy position and fitted with PAS hose.  His perineum and genitalia were prepped with Betadine solution and he was draped in the usual sterile fashion.  Cystoscopy was performed using the 23 [place] scope and 30 degree lens.  Examination revealed a normal urethra.  The external sphincter was intact.  The prostatic urethra was short with minimal trilobar hyperplasia without significant obstruction.  The bladder had mild trabeculation with no tumors stones or inflammation.  The ureteral orifices were unremarkable and effluxed clear urine.  The right ureteral orifice was cannulated with a 5 French opening catheter and Omnipaque was instilled.  Right retrograde pyelogram demonstrated a normal ureter and intrarenal collecting system.  The bladder was drained and the cystoscope was removed.  He was taken down from the lithotomy position.  His anesthetic was reversed and he was moved to recovery room in stable condition.  There were no complications.

## 2017-07-23 NOTE — Anesthesia Postprocedure Evaluation (Signed)
Anesthesia Post Note  Patient: Technical brewer  Procedure(s) Performed: CYSTOSCOPY RIGHT RETROGRADE (Right Bladder)     Patient location during evaluation: PACU Anesthesia Type: General Level of consciousness: awake and alert Pain management: pain level controlled Vital Signs Assessment: post-procedure vital signs reviewed and stable Respiratory status: spontaneous breathing, nonlabored ventilation, respiratory function stable and patient connected to nasal cannula oxygen Cardiovascular status: blood pressure returned to baseline and stable Postop Assessment: no apparent nausea or vomiting Anesthetic complications: no    Last Vitals:  Vitals:   07/23/17 1230 07/23/17 1311  BP: 99/66 101/83  Pulse: 70 68  Resp: 20 16  Temp:  36.8 C  SpO2: 100% 97%    Last Pain:  Vitals:   07/23/17 1230  TempSrc:   PainSc: 0-No pain                 Ryan P Ellender

## 2017-07-24 ENCOUNTER — Encounter (HOSPITAL_BASED_OUTPATIENT_CLINIC_OR_DEPARTMENT_OTHER): Payer: Self-pay | Admitting: Urology

## 2017-08-03 DIAGNOSIS — J069 Acute upper respiratory infection, unspecified: Secondary | ICD-10-CM | POA: Diagnosis not present

## 2017-09-24 DIAGNOSIS — Z0000 Encounter for general adult medical examination without abnormal findings: Secondary | ICD-10-CM | POA: Diagnosis not present

## 2017-09-24 DIAGNOSIS — N529 Male erectile dysfunction, unspecified: Secondary | ICD-10-CM | POA: Diagnosis not present

## 2017-09-24 DIAGNOSIS — Z125 Encounter for screening for malignant neoplasm of prostate: Secondary | ICD-10-CM | POA: Diagnosis not present

## 2017-09-24 DIAGNOSIS — E039 Hypothyroidism, unspecified: Secondary | ICD-10-CM | POA: Diagnosis not present

## 2017-09-24 DIAGNOSIS — E785 Hyperlipidemia, unspecified: Secondary | ICD-10-CM | POA: Diagnosis not present

## 2017-09-30 DIAGNOSIS — E039 Hypothyroidism, unspecified: Secondary | ICD-10-CM | POA: Diagnosis not present

## 2017-09-30 DIAGNOSIS — Z125 Encounter for screening for malignant neoplasm of prostate: Secondary | ICD-10-CM | POA: Diagnosis not present

## 2017-09-30 DIAGNOSIS — E785 Hyperlipidemia, unspecified: Secondary | ICD-10-CM | POA: Diagnosis not present

## 2017-09-30 DIAGNOSIS — Z0000 Encounter for general adult medical examination without abnormal findings: Secondary | ICD-10-CM | POA: Diagnosis not present

## 2017-11-16 DIAGNOSIS — F200 Paranoid schizophrenia: Secondary | ICD-10-CM | POA: Diagnosis not present

## 2017-11-18 DIAGNOSIS — E039 Hypothyroidism, unspecified: Secondary | ICD-10-CM | POA: Diagnosis not present

## 2017-12-02 DIAGNOSIS — M791 Myalgia, unspecified site: Secondary | ICD-10-CM | POA: Diagnosis not present

## 2017-12-28 DIAGNOSIS — F200 Paranoid schizophrenia: Secondary | ICD-10-CM | POA: Diagnosis not present

## 2018-01-12 ENCOUNTER — Other Ambulatory Visit: Payer: Self-pay | Admitting: Internal Medicine

## 2018-01-13 NOTE — Telephone Encounter (Signed)
DENIED.  NO LONGER A PT OF DR. Raliegh Ip.  MESSAGE SENT TO THE PHARMACY TO SEND RX REQUEST TO NEW PCP.

## 2018-02-15 DIAGNOSIS — F200 Paranoid schizophrenia: Secondary | ICD-10-CM | POA: Diagnosis not present

## 2018-03-17 DIAGNOSIS — R1013 Epigastric pain: Secondary | ICD-10-CM | POA: Diagnosis not present

## 2018-03-23 ENCOUNTER — Ambulatory Visit: Payer: Medicare HMO | Admitting: Cardiovascular Disease

## 2018-03-23 ENCOUNTER — Telehealth: Payer: Self-pay | Admitting: Adult Health

## 2018-03-23 ENCOUNTER — Encounter: Payer: Self-pay | Admitting: Cardiovascular Disease

## 2018-03-23 VITALS — BP 104/76 | HR 74 | Ht 67.0 in | Wt 168.0 lb

## 2018-03-23 DIAGNOSIS — I712 Thoracic aortic aneurysm, without rupture, unspecified: Secondary | ICD-10-CM

## 2018-03-23 DIAGNOSIS — E782 Mixed hyperlipidemia: Secondary | ICD-10-CM

## 2018-03-23 NOTE — Telephone Encounter (Signed)
Did not need this encounter °

## 2018-03-23 NOTE — Progress Notes (Signed)
Lionel December Date of Birth  February 06, 1961       [facility]    [employer] [address], [place], [place]  [address]   [place], [place]  [address] [address]   Fax  [phone]     Fax [phone]  Problem List: 1. Thoracic aortic aneurism - small  2. Hyperlipidemia 3. Laryngeal cancer ~2010, XRT 4. Bipolar disorder       Andrew Jennings is a 57 year old gentleman who presents today for followup of his thoracic aortic aneurysm. He was originally told in [place] that he had a thoracic aneurysm. He was previously followed by Dr. Rollene Fare.    Occasional CP - seems musculoskelatal.  Walks 20-30 minutes a day.   Used to smoke - stopped when he developed larangyeal cancer  January 22, 2015:  Burdett is seen in follow up for his ascending aortic aneurism. The MRI from 2015 reveals:  IMPRESSION: Maximal diameter of the ascending aorta above the valve has increased from 3.8 cm to 4.2 cm. At the sino-tubular junction, it has increased from 3.8 cm to 4.1 cm. It is grossly unchanged at the sinus of Valsalva.  He did not like the MRA because he was not able to hold his breath that long .  He is doing well.   Oct. 18, 2017:  Rochelle is seen today for follow-up of his thoracic aortic aneurysm. He had an MRI scan in 2015. He does not want have an MRI a again because it requires him to hold his breath for   He denies any chest pain or shortness of breath. His blood pressure and heart rate are normal.  Sept. 5, 2018:  Eudell is seen for follow up .   Has a small thoracic arortic aneurism  Is scheduled for a CT scan soon .  The last imaging of his aorta was 2015.   Sept 10, 2019: Sudais is seen today for follow-up of his thoracic aortic aneurysm and hyperlipidemia. CT angiogram performed March 25, 2017 reveals a small a sending aortic aneurysm measuring 4.1 mm in diameter.  It was unchanged compared to the July 2015  CT scan.  He was also found to have mild emphysema.  He has a history of hyperlipidemia.  He had a negative treadmill test in December, 2018 as part of a preoperative evaluation prior to his cystoscopy.  Cystoscopy in January was clear.  He does not have any significant bladder abnormalities.    Current Outpatient Medications on File Prior to Visit  Medication Sig Dispense Refill  . acetaminophen (TYLENOL) 500 MG tablet Take 1,000 mg by mouth every 6 (six) hours as needed.    Marland Kitchen aspirin 81 MG tablet Take 81 mg by mouth daily.    . diphenhydrAMINE (BENADRYL) 50 MG capsule Take 300 mg by mouth at bedtime.     . haloperidol (HALDOL) 10 MG tablet Take 30 mg by mouth every evening.     Marland Kitchen levothyroxine (SYNTHROID, LEVOTHROID) 137 MCG tablet Take 1 tablet (137 mcg total) by mouth daily. 90 tablet 3  . phenazopyridine (PYRIDIUM) 200 MG tablet Take 1 tablet (200 mg total) by mouth 3 (three) times daily as needed for pain. 10 tablet 0  . rosuvastatin (CRESTOR) 10 MG tablet TAKE 1 TABLET BY MOUTH ONCE DAILY 90 tablet 3  . sildenafil (REVATIO) 20 MG tablet TAKE ONE TO FIVE TABLETS BY MOUTH DAILY AS NEEDED 20 tablet  0   No current facility-administered medications on file prior to visit.     Allergies  Allergen Reactions  . Darvocet [Propoxyphene N-Acetaminophen] Other (See Comments)    "Makes me feel strange"..Per pt   . Penicillins     Makes sick, per pt   . Wellbutrin [Bupropion]     Makes me feel sick    Past Medical History:  Diagnosis Date  . AAA (abdominal aortic aneurysm) ([place]) last 18 yrs    4.1 cm per chest ct 03-25-17 epic, followed by dr Cathie Olden cardiology lov 04-30-16   . Aneurysm, aorta, thoracic ([place]) 01/20/2014  . Arthritis   . Bipolar disorder ([place]) 12/19/2010  . Bulging lumbar disc   . Complication of anesthesia    "stopped breathing with old anesthesia  with colonscopy at dr perry's office 4 yrs ago, did fine with new anesthesia with colonscopy this year at dr perry's  officeper pt"  . Depression   . GERD (gastroesophageal reflux disease)   . Glaucoma   . Hemorrhoids   . History of adenomatous polyp of colon   . History of exercise stress test 06-25-2017    dr Sondra Come   normal stress test without any evidence of ischemia, his chest pain atypical, his ascending aortis aneurism is small and has been stable by seial CT angiograms,  he is at low risk   . 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 Hyperlipidemia   . Hypothyroidism    secondary radiation therpy induced in 02/ 2010  . Laryngeal cancer ([place]) 12/19/2010  . OSA (obstructive sleep apnea)    does not use macine cannot afford it  . OSA on CPAP 12/25/2011  . Schizophrenia ([place])   . Scoliosis   . Ulcerative colitis 12/19/2010    Past Surgical History:  Procedure Laterality Date  . CARDIOVASCULAR STRESS TEST  03/03/2013   normal nuclear study (lexiscan no exercise) w/ no evidence ishcemia/  normal LV function and wall motion, 41%  . COLONOSCOPY  last one 01-22-2017  dr Henrene Pastor  . CYSTOSCOPY/RETROGRADE/URETEROSCOPY Right 07/23/2017   Procedure: CYSTOSCOPY RIGHT RETROGRADE;  Surgeon: [name], MD;  Location: [facility];  Service: Urology;  Laterality: Right;  . NECK SURGERY  yrs ago   BLOOD CLOT IN NECK  . TRANSTHORACIC ECHOCARDIOGRAM  12/18/2011   mild concentric LVH, ef >55%/ mild AR and MR/ trace TR/ mild PR/ mild aortic root dilatation  . UMBILICAL HERNIA REPAIR  as child    Social History   Tobacco Use  Smoking Status Former Smoker  . Types: Cigarettes  . Last attempt to quit: 05/02/2009    . Years since quitting: 8.8  Smokeless Tobacco Never Used    Social History   Substance and Sexual Activity  Alcohol Use Yes   Comment: occasional    Family History  Problem Relation Age of Onset  . Arthritis Mother   . Hyperlipidemia Mother   . Heart disease Mother   . Arthritis Father   . Colon cancer Neg Hx     Reviw of Systems:  Reviewed in the HPI.  All other  systems are negative.  Physical Exam: There were no vitals taken for this visit.  GEN:  Well nourished, well developed in no acute distress HEENT: Normal NECK: No JVD; No carotid bruits LYMPHATICS: No lymphadenopathy CARDIAC: RRR  RESPIRATORY:  Clear to auscultation without rales, wheezing or rhonchi  ABDOMEN: Soft, non-tender, non-distended MUSCULOSKELETAL:  No edema; No deformity  SKIN: Warm and dry NEUROLOGIC:  Alert and oriented x 3   ECG: March 23, 2018: Normal sinus rhythm at 74.  Occasional premature ventricular contraction.  Otherwise normal EKG.   Assessment / Plan:   1. Thoracic aortic aneurism - His thoracic aortic aneurysm is quite small.  Measures 4.2 cm at its maximum diameter.  I will see him again in 1 year.  Consider reimaging at that time.     2. Hyperlipidemia - follow up with his primary MD   3. Laryngeal cancer ~2010, XRT  4. Bipolar disorder    Mertie Moores, MD  03/23/2018 5:43 AM    [address],  [place], [place]  [address] Pager [phone] Phone: [phone]; Fax: [phone]

## 2018-03-23 NOTE — Patient Instructions (Signed)

## 2018-03-29 DIAGNOSIS — E039 Hypothyroidism, unspecified: Secondary | ICD-10-CM | POA: Diagnosis not present

## 2018-03-29 DIAGNOSIS — N529 Male erectile dysfunction, unspecified: Secondary | ICD-10-CM | POA: Diagnosis not present

## 2018-03-29 DIAGNOSIS — J439 Emphysema, unspecified: Secondary | ICD-10-CM | POA: Diagnosis not present

## 2018-03-29 DIAGNOSIS — E785 Hyperlipidemia, unspecified: Secondary | ICD-10-CM | POA: Diagnosis not present

## 2018-03-29 DIAGNOSIS — F209 Schizophrenia, unspecified: Secondary | ICD-10-CM | POA: Diagnosis not present

## 2018-03-31 ENCOUNTER — Other Ambulatory Visit: Payer: Self-pay | Admitting: Cardiovascular Disease

## 2018-05-28 ENCOUNTER — Telehealth: Payer: Self-pay | Admitting: Cardiovascular Disease

## 2018-05-28 NOTE — Telephone Encounter (Signed)
Spoke with patient who called to ask when his next appointment will be. I advised that follow-up is scheduled for September and that we will contact him closer to that time to get him scheduled. He thanked me for my help.

## 2018-05-28 NOTE — Telephone Encounter (Signed)
New Message           Patient called wanted to speak to michelle.

## 2018-06-04 ENCOUNTER — Telehealth: Payer: Self-pay | Admitting: Cardiovascular Disease

## 2018-06-04 NOTE — Telephone Encounter (Signed)
New message   Pt c/o medication issue:  1. Name of Medication: rosuvastatin (CRESTOR) 10 MG tablet  2. How are you currently taking this medication (dosage and times per day)?1 time daily  3. Are you having a reaction (difficulty breathing--STAT)? No   4. What is your medication issue? Patient has lost his pills. Patient wants to know if insurance will cover him getting his pills since he lost them. Please advise.

## 2018-06-04 NOTE — Telephone Encounter (Signed)
Pt called to report that he lost his full bottle of Crestor 10mg .. He says that he purchased a month supply at [facility] for 28.00.  He was very upset and wanted me to call [facility] and [place] and be sure they will let him pick up the next month supply.Marland Kitchen Spoke with the [facility] at [facility] and she was upset that he also fussed at her and she reports that he was advised that for the next month at least he will have to buy the next refill and then they can put through the next month after that to his Insurance co... I advised the pt and after he used foul language with me.. Advised me that he is fine paying for the next month and he told the pharmacist that and was upset that she "made him call us" even though he was already aware of this. I asked the pt to let us know if he needed anything else and asked him to not use foul language the next time he needs assistance and pt apologized.

## 2018-06-17 DIAGNOSIS — F200 Paranoid schizophrenia: Secondary | ICD-10-CM | POA: Diagnosis not present

## 2018-08-09 DIAGNOSIS — G8929 Other chronic pain: Secondary | ICD-10-CM | POA: Diagnosis not present

## 2018-08-09 DIAGNOSIS — M545 Low back pain: Secondary | ICD-10-CM | POA: Diagnosis not present

## 2018-08-09 DIAGNOSIS — J439 Emphysema, unspecified: Secondary | ICD-10-CM | POA: Diagnosis not present

## 2018-11-15 DIAGNOSIS — F200 Paranoid schizophrenia: Secondary | ICD-10-CM | POA: Diagnosis not present

## 2018-11-17 DIAGNOSIS — G4700 Insomnia, unspecified: Secondary | ICD-10-CM | POA: Diagnosis not present

## 2018-11-17 DIAGNOSIS — M542 Cervicalgia: Secondary | ICD-10-CM | POA: Diagnosis not present

## 2018-12-05 ENCOUNTER — Other Ambulatory Visit: Payer: Self-pay

## 2018-12-05 ENCOUNTER — Emergency Department (HOSPITAL_BASED_OUTPATIENT_CLINIC_OR_DEPARTMENT_OTHER)
Admission: EM | Admit: 2018-12-05 | Discharge: 2018-12-05 | Disposition: A | Discharge status: Home / Self Care | Payer: Medicare HMO | Attending: Emergency Medicine | Admitting: Emergency Medicine

## 2018-12-05 ENCOUNTER — Encounter (HOSPITAL_BASED_OUTPATIENT_CLINIC_OR_DEPARTMENT_OTHER): Payer: Self-pay

## 2018-12-05 DIAGNOSIS — Y929 Unspecified place or not applicable: Secondary | ICD-10-CM | POA: Insufficient documentation

## 2018-12-05 DIAGNOSIS — Z7982 Long term (current) use of aspirin: Secondary | ICD-10-CM | POA: Insufficient documentation

## 2018-12-05 DIAGNOSIS — S00512A Abrasion of oral cavity, initial encounter: Secondary | ICD-10-CM | POA: Insufficient documentation

## 2018-12-05 DIAGNOSIS — Z79899 Other long term (current) drug therapy: Secondary | ICD-10-CM | POA: Diagnosis not present

## 2018-12-05 DIAGNOSIS — Y939 Activity, unspecified: Secondary | ICD-10-CM | POA: Diagnosis not present

## 2018-12-05 DIAGNOSIS — Y999 Unspecified external cause status: Secondary | ICD-10-CM | POA: Insufficient documentation

## 2018-12-05 DIAGNOSIS — S0081XA Abrasion of other part of head, initial encounter: Secondary | ICD-10-CM

## 2018-12-05 DIAGNOSIS — Z87891 Personal history of nicotine dependence: Secondary | ICD-10-CM | POA: Diagnosis not present

## 2018-12-05 DIAGNOSIS — F250 Schizoaffective disorder, bipolar type: Secondary | ICD-10-CM | POA: Insufficient documentation

## 2018-12-05 DIAGNOSIS — X58XXXA Exposure to other specified factors, initial encounter: Secondary | ICD-10-CM | POA: Insufficient documentation

## 2018-12-05 NOTE — ED Notes (Signed)
ED Provider at bedside. 

## 2018-12-05 NOTE — ED Provider Notes (Signed)
[facility] EMERGENCY DEPARTMENT Provider Note   CSN: [id number] Arrival date & time: 12/05/18  7425    History   Chief Complaint Chief Complaint  Patient presents with  . Spitting Blood    HPI Ardie Mclennan is a 58 y.o. male.     Patient is a 58 year old male with a history of schizophrenia, bipolar disorder, AAA and hyperlipidemia who presents with bleeding from his mouth.  He states he was eating some rice and it felt hot.  He spit it out and noticed some blood.  This happened 1 time he has had no further episodes.  He denies any throat pain.  No trouble swallowing.  No shortness of breath.     Past Medical History:  Diagnosis Date  . AAA (abdominal aortic aneurysm) ([place]) last 18 yrs    4.1 cm per chest ct 03-25-17 epic, followed by dr Cathie Olden cardiology lov 04-30-16   . Aneurysm, aorta, thoracic ([place]) 01/20/2014  . Arthritis   . Bipolar disorder ([place]) 12/19/2010  . Bulging lumbar disc   . Complication of anesthesia    "stopped breathing with old anesthesia  with colonscopy at dr perry's office 4 yrs ago, did fine with new anesthesia with colonscopy this year at dr perry's officeper pt"  . Depression   . GERD (gastroesophageal reflux disease)   . Glaucoma   . Hemorrhoids   . History of adenomatous polyp of colon   . History of exercise stress test 06-25-2017    dr Sondra Come   normal stress test without any evidence of ischemia, his chest pain atypical, his ascending aortis aneurism is small and has been stable by seial CT angiograms,  he is at low risk   . 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 Hyperlipidemia   . Hypothyroidism    secondary radiation therpy induced in 02/ 2010  . Laryngeal cancer ([place]) 12/19/2010  . OSA (obstructive sleep apnea)    does not use macine cannot afford it  . OSA on CPAP 12/25/2011  . Schizophrenia ([place])   . Scoliosis   . Ulcerative colitis 12/19/2010    Patient Active Problem List   Diagnosis Date Noted  . Aneurysm, aorta, thoracic ([place]) 01/20/2014  . OSA on CPAP 12/25/2011  . Hyperlipidemia 12/19/2010  . Hypothyroidism 12/19/2010  . Bipolar disorder ([place]) 12/19/2010  . Schizophrenia ([place]) 12/19/2010  . Ulcerative colitis 12/19/2010  . Laryngeal cancer ([place]) 12/19/2010    Past Surgical History:  Procedure Laterality Date  . CARDIOVASCULAR STRESS TEST  03/03/2013   normal nuclear study (lexiscan no exercise) w/ no evidence ishcemia/  normal LV function and wall motion, 41%  . COLONOSCOPY  last one 01-22-2017  dr Henrene Pastor  . CYSTOSCOPY/RETROGRADE/URETEROSCOPY Right 07/23/2017   Procedure: CYSTOSCOPY RIGHT RETROGRADE;  Surgeon: [name], MD;  Location: [facility];  Service: Urology;  Laterality: Right;  . NECK SURGERY  yrs ago   BLOOD CLOT IN NECK  . TRANSTHORACIC ECHOCARDIOGRAM  12/18/2011   mild concentric LVH, ef >55%/ mild AR and MR/ trace TR/ mild PR/ mild aortic root dilatation  . UMBILICAL  HERNIA REPAIR  as child        Home Medications    Prior to Admission medications   Medication Sig Start Date End Date Taking? Authorizing Provider  acetaminophen (TYLENOL) 500 MG tablet Take 1,000 mg by mouth every 6 (six) hours as needed.    [provider]  aspirin 81 MG tablet Take 81 mg by mouth daily.    [provider]  diphenhydrAMINE (BENADRYL) 50 MG capsule Take 300 mg by mouth at bedtime.     [provider]  haloperidol (HALDOL) 10 MG tablet Take 30 mg by mouth every evening.  01/11/15   [provider]  levothyroxine (SYNTHROID, LEVOTHROID) 137 MCG tablet Take  1 tablet (137 mcg total) by mouth daily. 03/09/17   Marletta Lor, MD  omeprazole (PRILOSEC) 40 MG capsule Take 1 capsule by mouth daily. 03/17/18   [provider]  rosuvastatin (CRESTOR) 10 MG tablet TAKE 1 TABLET BY MOUTH ONCE DAILY 04/01/18   Nahser, Wonda Cheng, MD  sildenafil (REVATIO) 20 MG tablet TAKE ONE TO FIVE TABLETS BY MOUTH DAILY AS NEEDED 07/13/17   Dorothyann Peng, NP    Family History Family History  Problem Relation Age of Onset  . Arthritis Mother   . Hyperlipidemia Mother   . Heart disease Mother   . Arthritis Father   . Colon cancer Neg Hx     Social History Social History   Tobacco Use  . Smoking status: Former Smoker    Types: Cigarettes    Last attempt to quit: 05/02/2009    Years since quitting: 9.6  . Smokeless tobacco: Never Used  Substance Use Topics  . Alcohol use: Yes    Comment: occasional  . Drug use: No     Allergies   Darvocet [propoxyphene n-acetaminophen]; Penicillins; and Wellbutrin [bupropion]   Review of Systems Review of Systems  Constitutional: Negative for chills, diaphoresis, fatigue and fever.  HENT: Negative for congestion, rhinorrhea and sneezing.   Eyes: Negative.   Respiratory: Negative for cough, chest tightness and shortness of breath.   Cardiovascular: Negative for chest pain and leg swelling.  Gastrointestinal: Negative for abdominal pain, blood in stool, diarrhea, nausea and vomiting.       Spitting up blood one time   Genitourinary: Negative for difficulty urinating, flank pain, frequency and hematuria.  Musculoskeletal: Negative for arthralgias and back pain.  Skin: Negative for rash.  Neurological: Negative for dizziness, speech difficulty, weakness, numbness and headaches.     Physical Exam Updated Vital Signs BP (!) 115/96 (BP Location: Right Arm)   Pulse 96   Temp 98.1 F (36.7 C)   Resp 20   Ht 5\' 7"  (1.702 m)   Wt 77.6 kg   SpO2 95%   BMI 26.78 kg/m   Physical Exam Constitutional:       Appearance: He is well-developed.  HENT:     Head: Normocephalic and atraumatic.     Mouth/Throat:     Comments: Patient has a superficial laceration to the buccal surface of his left cheek.  There is no active bleeding.  Edges are well approximated.  He has very poor dentition. Eyes:     Pupils: Pupils are equal, round, and reactive to light.  Neck:     Musculoskeletal: Normal range of motion and neck supple.  Cardiovascular:     Rate and Rhythm: Normal rate and regular rhythm.     Heart sounds: Normal heart sounds.  Pulmonary:     Effort: Pulmonary  effort is normal. No respiratory distress.     Breath sounds: Normal breath sounds. No wheezing or rales.  Chest:     Chest wall: No tenderness.  Abdominal:     General: Bowel sounds are normal.     Palpations: Abdomen is soft.     Tenderness: There is no abdominal tenderness. There is no guarding or rebound.  Musculoskeletal: Normal range of motion.  Lymphadenopathy:     Cervical: No cervical adenopathy.  Skin:    General: Skin is warm and dry.     Findings: No rash.  Neurological:     Mental Status: He is alert and oriented to person, place, and time.      ED Treatments / Results  Labs (all labs ordered are listed, but only abnormal results are displayed) Labs Reviewed - No data to display  EKG None  Radiology No results found.  Procedures Procedures (including critical care time)  Medications Ordered in ED Medications - No data to display   Initial Impression / Assessment and Plan / ED Course  I have reviewed the triage vital signs and the nursing notes.  Pertinent labs & imaging results that were available during my care of the patient were reviewed by me and considered in my medical decision making (see chart for details).        Patient presents with a small abrasion to the inside of his mouth, likely from biting it when he was eating the rice.  This is likely the etiology of the blood.  He has had no  further episodes of bleeding or spitting up blood.  He has no shortness of breath or signs of edema.  No throat pain.  He was discharged home in good condition.  Symptomatic care instructions were given.  Return precautions were given.  Final Clinical Impressions(s) / ED Diagnoses   Final diagnoses:  Abrasion of cheek, initial encounter    ED Discharge Orders    None       Malvin Johns, MD 12/05/18 [phone]

## 2018-12-05 NOTE — ED Notes (Signed)
Pt noted to have a bite on the inside of his L check that is bleeding.

## 2018-12-05 NOTE — ED Triage Notes (Signed)
Pt states he swallowed some hot rice and started spitting up blood.

## 2019-01-10 ENCOUNTER — Other Ambulatory Visit: Payer: Self-pay | Admitting: Cardiovascular Disease

## 2019-02-10 DIAGNOSIS — N529 Male erectile dysfunction, unspecified: Secondary | ICD-10-CM | POA: Diagnosis not present

## 2019-02-10 DIAGNOSIS — F209 Schizophrenia, unspecified: Secondary | ICD-10-CM | POA: Diagnosis not present

## 2019-02-10 DIAGNOSIS — E039 Hypothyroidism, unspecified: Secondary | ICD-10-CM | POA: Diagnosis not present

## 2019-02-10 DIAGNOSIS — Z125 Encounter for screening for malignant neoplasm of prostate: Secondary | ICD-10-CM | POA: Diagnosis not present

## 2019-02-10 DIAGNOSIS — G4733 Obstructive sleep apnea (adult) (pediatric): Secondary | ICD-10-CM | POA: Diagnosis not present

## 2019-02-10 DIAGNOSIS — E785 Hyperlipidemia, unspecified: Secondary | ICD-10-CM | POA: Diagnosis not present

## 2019-02-10 DIAGNOSIS — Z0000 Encounter for general adult medical examination without abnormal findings: Secondary | ICD-10-CM | POA: Diagnosis not present

## 2019-02-18 ENCOUNTER — Telehealth: Payer: Self-pay | Admitting: Cardiovascular Disease

## 2019-02-18 NOTE — Telephone Encounter (Signed)
New Message      Pt would like for Sharyn Lull to call him back    Please call

## 2019-02-18 NOTE — Telephone Encounter (Signed)
Spoke with patient who requests a virtual visit after 10:30 am. Patient does not want to come into the office. I moved his appointment to 10/6 and he thanked me for the call.

## 2019-02-23 DIAGNOSIS — E039 Hypothyroidism, unspecified: Secondary | ICD-10-CM | POA: Diagnosis not present

## 2019-02-23 DIAGNOSIS — Z0000 Encounter for general adult medical examination without abnormal findings: Secondary | ICD-10-CM | POA: Diagnosis not present

## 2019-02-23 DIAGNOSIS — E785 Hyperlipidemia, unspecified: Secondary | ICD-10-CM | POA: Diagnosis not present

## 2019-02-23 DIAGNOSIS — Z125 Encounter for screening for malignant neoplasm of prostate: Secondary | ICD-10-CM | POA: Diagnosis not present

## 2019-02-25 ENCOUNTER — Telehealth: Payer: Self-pay | Admitting: Cardiovascular Disease

## 2019-02-25 NOTE — Telephone Encounter (Signed)
New Message   Patient is calling because he had lab work done with Dr. Orland Mustard and his cholesterol was 165. He wanted to inform the provider.

## 2019-03-01 DIAGNOSIS — H524 Presbyopia: Secondary | ICD-10-CM | POA: Diagnosis not present

## 2019-03-10 IMAGING — CT CT ANGIO CHEST
2 of 7 series · 18 of 46 positions shown · IV contrast (isovue)
Comparison: Chest MRA 02/09/2014

CLINICAL DATA: Thoracic aortic aneurysm without rupture. Follow-up.

EXAM:
CT ANGIOGRAPHY CHEST WITH CONTRAST
TECHNIQUE: Multidetector CT imaging of the chest was performed using the
standard protocol during bolus administration of intravenous
contrast. Multiplanar CT image reconstructions and MIPs were
obtained to evaluate the vascular anatomy.
CONTRAST:  100 cc Isovue 370 intravenous

[Series 4: aorta 3.0 i31f 2 · axial · 0.77mm/px · z∈[-309,-15]mm · 15 of 106 slices shown]
[im 4/106  lung]
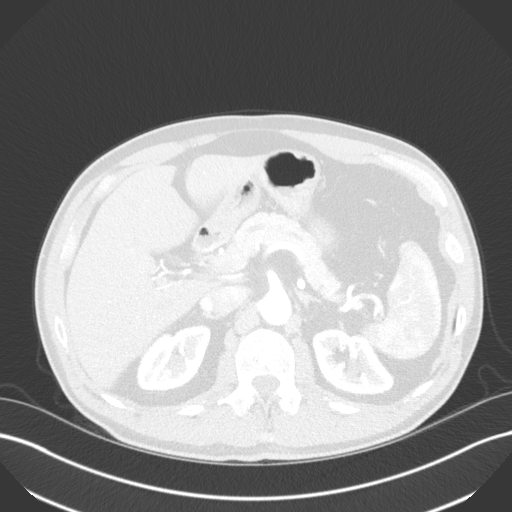
[im 12/106  soft-tissue]
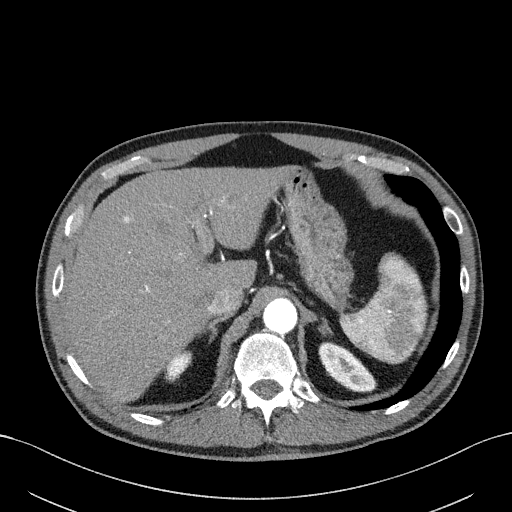
[im 20/106  lung]
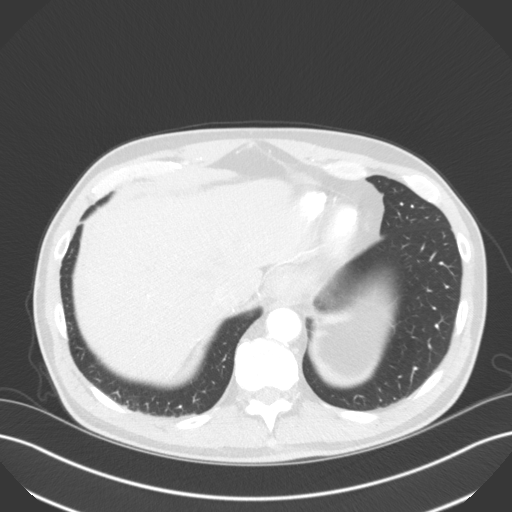
[im 28/106  soft-tissue]
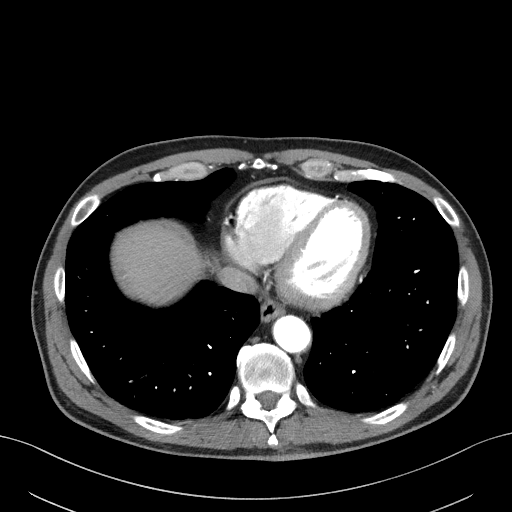
[im 32/106  lung]
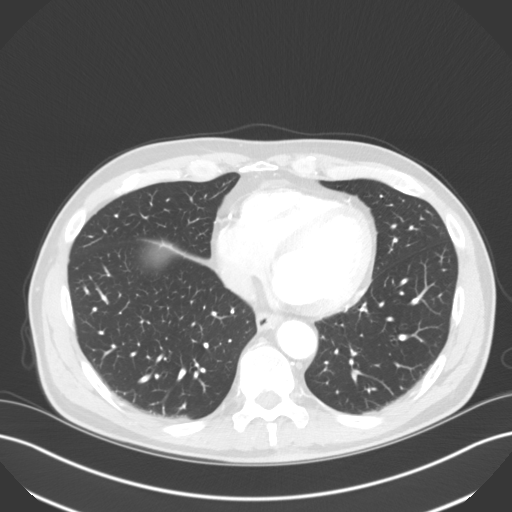
[im 39/106  soft-tissue]
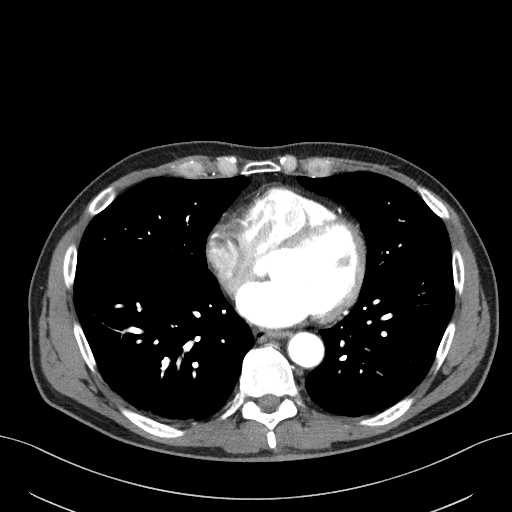
[im 47/106  lung]
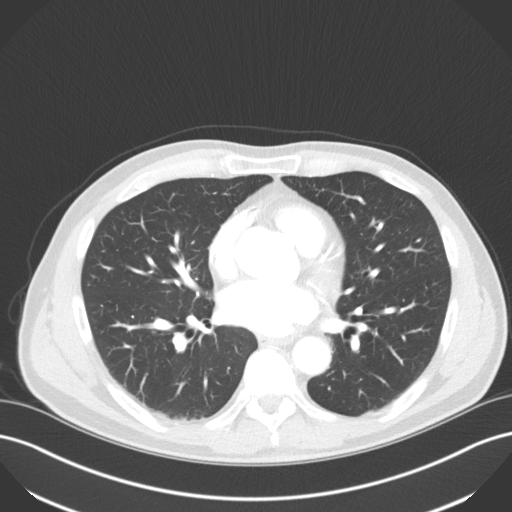
[im 55/106  soft-tissue]
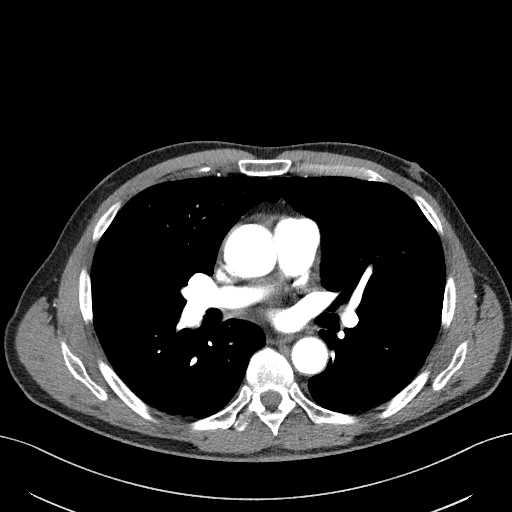
[im 59/106  lung]
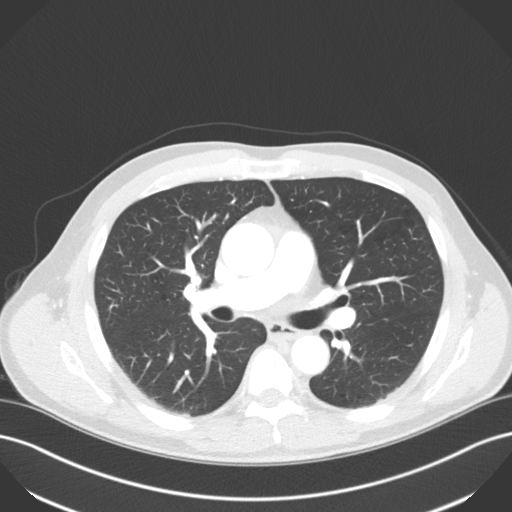
[im 67/106  soft-tissue]
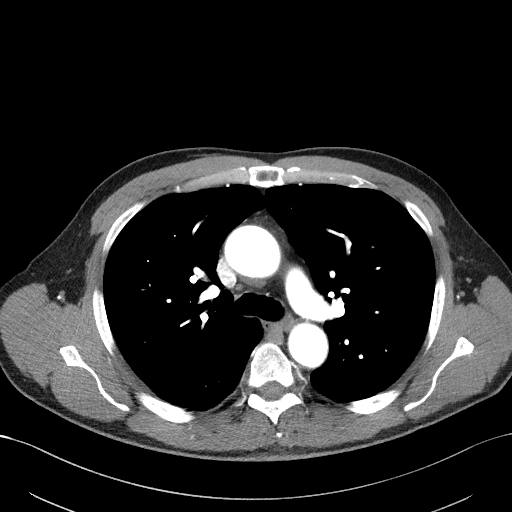
[im 74/106  lung]
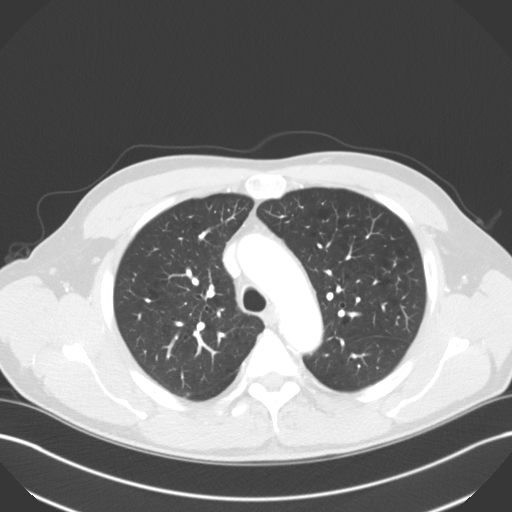
[im 78/106  soft-tissue]
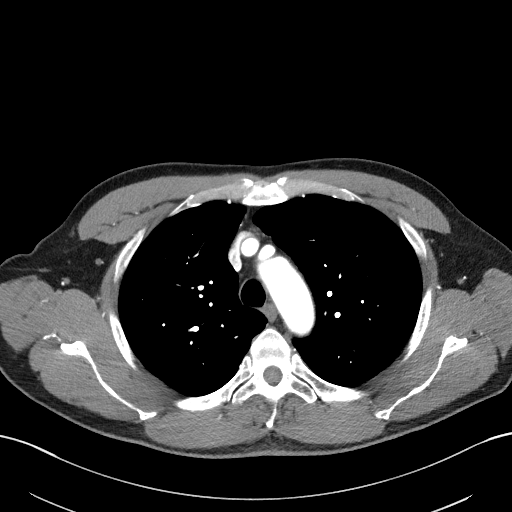
[im 86/106  lung]
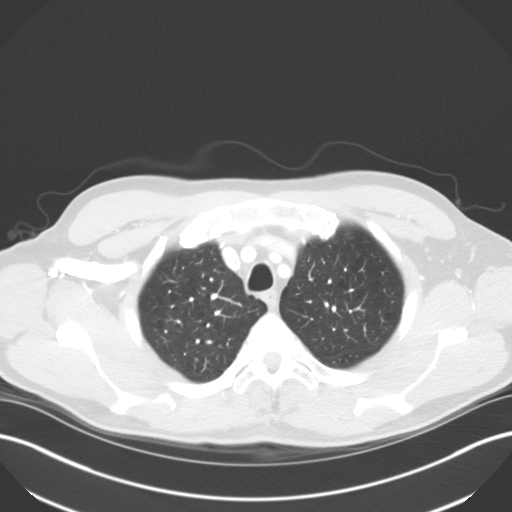
[im 94/106  soft-tissue]
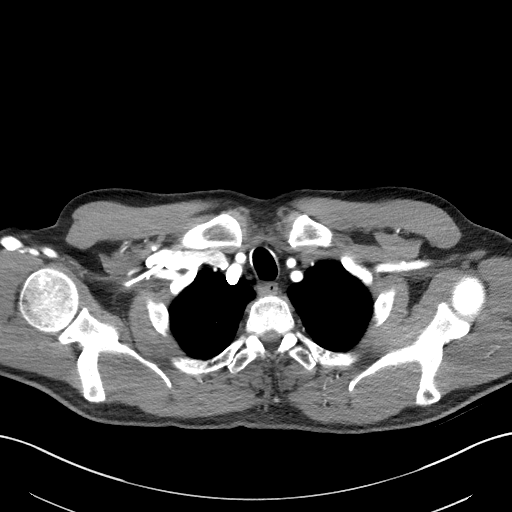
[im 102/106  lung]
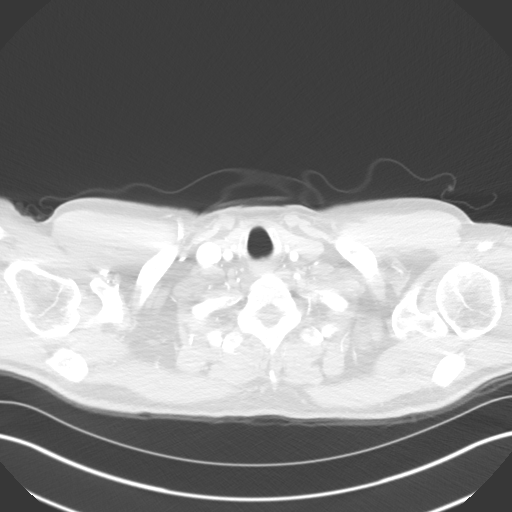

[Series 7: coronals · coronal · 0.63mm/px · 3 of 125 slices shown]
[im 32/125  soft-tissue]
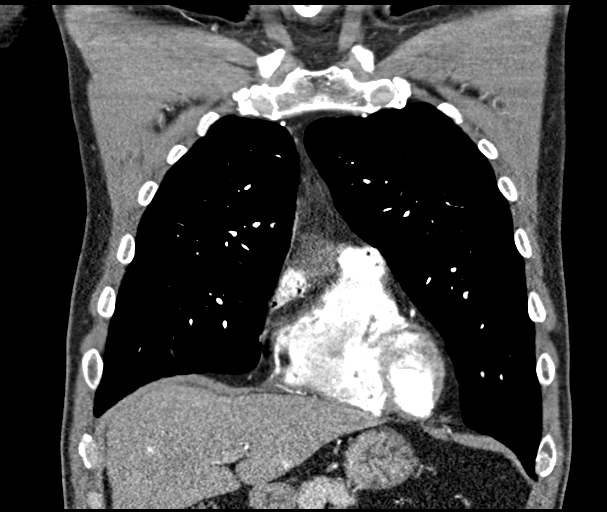
[im 63/125  soft-tissue]
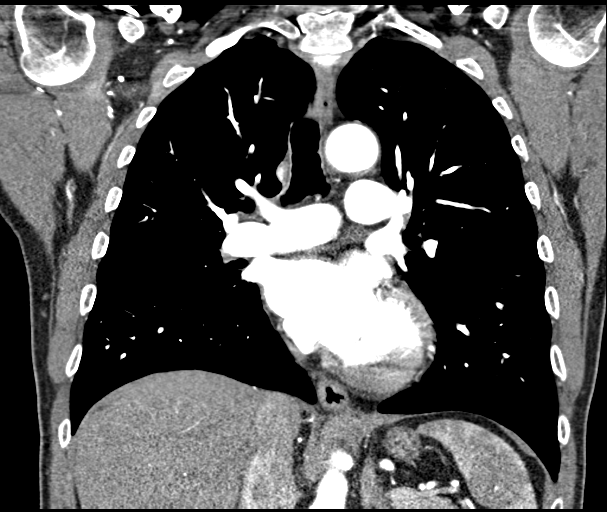
[im 94/125  soft-tissue]
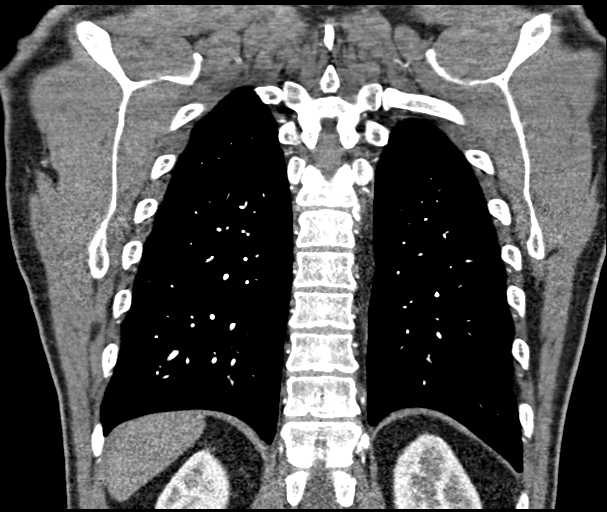

[18 of 46 positions shown; findings below may reference images not displayed]

FINDINGS: Cardiovascular:  Aortic diameter:

Valve 37 mm

Sinuses of Valsalva 44 mm

[place] junction 35 mm

Ascending segment 41 mm

Arch 31 mm

Descending segment 28 mm

There is no detected change from prior when allowing for differences
in measurement technique.

Normal heart size. Small volume anterior pericardial fluid, stable
from prior. The opacified pulmonary arteries are clear.

Mediastinum/Nodes: Negative for mass or adenopathy.

Lungs/Pleura: Mild centrilobular emphysema. There is no edema,
consolidation, effusion, or pneumothorax. No suspicious nodules.

Upper Abdomen: Upper limits of normal pancreatic duct diameter of 3
mm, stable from 6573 chest CT and coronal T2 weighted imaging on
prior MRI.

Musculoskeletal: No acute or aggressive finding.

Review of the MIP images confirms the above findings.
IMPRESSION: 1. Aneurysmal ascending aorta measuring up to 4.1 cm diameter,
unchanged since January 2014 comparison.
2. Emphysema ([id number]), centrilobular and mild.

## 2019-04-10 ENCOUNTER — Other Ambulatory Visit: Payer: Self-pay | Admitting: Cardiovascular Disease

## 2019-04-11 ENCOUNTER — Telehealth: Payer: Self-pay | Admitting: Cardiovascular Disease

## 2019-04-11 ENCOUNTER — Other Ambulatory Visit: Payer: Self-pay

## 2019-04-11 NOTE — Telephone Encounter (Signed)
 *  STAT* If patient is at the pharmacy, call can be transferred to refill team.   1. Which medications need to be refilled? (please list name of each medication and dose if known) rosuvastatin (CRESTOR) 10 MG tablet  2. Which pharmacy/location (including street and city if local pharmacy) is medication to be sent to? [facility]  3. Do they need a 30 day or 90 day supply? [place]

## 2019-04-13 ENCOUNTER — Telehealth: Payer: Medicare HMO | Admitting: Cardiovascular Disease

## 2019-04-18 DIAGNOSIS — F200 Paranoid schizophrenia: Secondary | ICD-10-CM | POA: Diagnosis not present

## 2019-04-19 ENCOUNTER — Telehealth (INDEPENDENT_AMBULATORY_CARE_PROVIDER_SITE_OTHER): Payer: Medicare HMO | Admitting: Cardiovascular Disease

## 2019-04-19 ENCOUNTER — Encounter: Payer: Self-pay | Admitting: Cardiovascular Disease

## 2019-04-19 ENCOUNTER — Other Ambulatory Visit: Payer: Self-pay

## 2019-04-19 VITALS — Ht 67.0 in | Wt 173.0 lb

## 2019-04-19 DIAGNOSIS — E782 Mixed hyperlipidemia: Secondary | ICD-10-CM

## 2019-04-19 DIAGNOSIS — I712 Thoracic aortic aneurysm, without rupture, unspecified: Secondary | ICD-10-CM

## 2019-04-19 DIAGNOSIS — D649 Anemia, unspecified: Secondary | ICD-10-CM

## 2019-04-19 NOTE — Progress Notes (Signed)
Virtual Visit via Telephone Note   This visit type was conducted due to national recommendations for restrictions regarding the COVID-19 Pandemic (e.g. social distancing) in an effort to limit this patient's exposure and mitigate transmission in our community.  Due to his co-morbid illnesses, this patient is at least at moderate risk for complications without adequate follow up.  This format is felt to be most appropriate for this patient at this time.  The patient did not have access to video technology/had technical difficulties with video requiring transitioning to audio format only (telephone).  All issues noted in this document were discussed and addressed.  No physical exam could be performed with this format.  Please refer to the patient's chart for his  consent to telehealth for [facility].   Date:  04/19/2019   ID:  Andrew [place], DOB 07/14/1961, MRN [id number]  Patient Location: Home Provider Location: Office  PCP:  Andrew Pepper, MD  Cardiologist:  Mertie Moores, MD  Electrophysiologist:  None   Problem List: 1. Thoracic aortic aneurism - small  2. Hyperlipidemia 3. Laryngeal cancer ~2010, XRT 4. Bipolar disorder       Andrew Jennings is a 58 year old gentleman who presents today for followup of his thoracic aortic aneurysm. He was originally told in [place] that he had a thoracic aneurysm. He was previously followed by Dr. Rollene Fare.    Occasional CP - seems musculoskelatal.  Walks 20-30 minutes a day.   Used to smoke - stopped when he developed larangyeal cancer  January 22, 2015:  Andrew [place] is seen in follow up for his ascending aortic aneurism. The MRI from 2015 reveals:  IMPRESSION: Maximal diameter of the ascending aorta above the valve has increased from 3.8 cm to 4.2 cm. At the sino-tubular junction, it has increased from 3.8 cm to 4.1 cm. It is grossly unchanged at the sinus of Valsalva.  He did not like the MRA because he was not able to hold his breath  that long .  He is doing well.   Oct. 18, 2017:  Andrew [place] is seen today for follow-up of his thoracic aortic aneurysm. He had an MRI scan in 2015. He does not want have an MRI a again because it requires him to hold his breath for   He denies any chest pain or shortness of breath. His blood pressure and heart rate are normal.  Sept. 5, 2018:  Andrew [place] is seen for follow up .   Has a small thoracic arortic aneurism  Is scheduled for a CT scan soon .  The last imaging of his aorta was 2015.   Sept 10, 2019: Andrew [place] is seen today for follow-up of his thoracic aortic aneurysm and hyperlipidemia. CT angiogram performed March 25, 2017 reveals a small a sending aortic aneurysm measuring 4.1 mm in diameter.  It was unchanged compared to the July 2015 CT scan.  He was also found to have mild emphysema.  He has a history of hyperlipidemia.  He had a negative treadmill test in December, 2018 as part of a preoperative evaluation prior to his cystoscopy.  Cystoscopy in January was clear.  He does not have any significant bladder abnormalities.  Evaluation Performed:  Follow-Up Visit  Chief Complaint:   :  Follow up for thoracic aortic aneurism   Oct.  6, 2020    [name] is a 58 y.o. male with  Hx of thoracic aortic aneurysm hyperlipidemia.  He has a very mild ascending aortic aneurysm measuring 4.1 cm in diameter.  No VS available today  No CP ,  Able to do his normal activities.  Walks 20 min day   Staying healthy , wears mask    The patient does not have symptoms concerning for COVID-19 infection (fever, chills, cough, or new shortness of breath).    Past Medical History:  Diagnosis Date  . AAA (abdominal aortic aneurysm) ([place]) last 18 yrs    4.1 cm per chest ct 03-25-17 epic, followed by dr Cathie Olden cardiology lov 04-30-16   . Aneurysm, aorta, thoracic ([place]) 01/20/2014  . Arthritis   . Bipolar disorder ([place]) 12/19/2010  . Bulging lumbar disc   . Complication of anesthesia     "stopped breathing with old anesthesia  with colonscopy at dr perry's office 4 yrs ago, did fine with new anesthesia with colonscopy this year at dr perry's officeper pt"  . Depression   . GERD (gastroesophageal reflux disease)   . Glaucoma   . Hemorrhoids   . History of adenomatous polyp of colon   . History of exercise stress test 06-25-2017    dr Sondra Come   normal stress test without any evidence of ischemia, his chest pain atypical, his ascending aortis aneurism is small and has been stable by seial CT angiograms,  he is at low risk   . 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 Hyperlipidemia   . Hypothyroidism    secondary radiation therpy induced in 02/ 2010  . Laryngeal cancer ([place]) 12/19/2010  . OSA (obstructive sleep apnea)    does not use macine cannot afford it  . OSA on CPAP 12/25/2011  . Schizophrenia ([place])   . Scoliosis   . Ulcerative colitis 12/19/2010   Past Surgical History:  Procedure Laterality Date  . CARDIOVASCULAR STRESS TEST  03/03/2013   normal nuclear study (lexiscan no exercise) w/ no evidence ishcemia/  normal LV function and wall motion, 41%  . COLONOSCOPY  last one 01-22-2017  dr Henrene Pastor  . CYSTOSCOPY/RETROGRADE/URETEROSCOPY Right 07/23/2017   Procedure: CYSTOSCOPY RIGHT RETROGRADE;  Surgeon: [name], MD;  [facility];  Service: Urology;  Laterality: Right;  . NECK SURGERY  yrs ago   BLOOD CLOT IN NECK  . TRANSTHORACIC ECHOCARDIOGRAM  12/18/2011   mild concentric LVH, ef >55%/ mild AR and MR/ trace TR/ mild PR/ mild aortic root dilatation  .  UMBILICAL HERNIA REPAIR  as child     Current Meds  Medication Sig  . acetaminophen (TYLENOL) 500 MG tablet Take 1,000 mg by mouth every 6 (six) hours as needed.  Marland Kitchen aspirin 81 MG tablet Take 81 mg by mouth daily.  . diphenhydrAMINE (BENADRYL) 50 MG capsule Take 300 mg by mouth at bedtime.   . haloperidol (HALDOL) 5 MG tablet Take 1 tablet by mouth daily.  Marland Kitchen levothyroxine (SYNTHROID) 150 MCG tablet Take 1 tablet by mouth daily.  . rosuvastatin (CRESTOR) 10 MG tablet Take 1 tablet (10 mg total) by mouth daily. Please keep upcoming appt for future refills.  . sildenafil (REVATIO) 20 MG tablet TAKE ONE TO FIVE TABLETS BY MOUTH DAILY AS NEEDED  . [DISCONTINUED]  haloperidol (HALDOL) 10 MG tablet Take 30 mg by mouth every evening.   . [DISCONTINUED] levothyroxine (SYNTHROID, LEVOTHROID) 137 MCG tablet Take 1 tablet (137 mcg total) by mouth daily.     Allergies:   Darvocet [propoxyphene n-acetaminophen], Penicillins, and Wellbutrin [bupropion]   Social History   Tobacco Use  . Smoking status: Former Smoker    Types: Cigarettes    Quit date: 05/02/2009    Years since quitting: 9.9  . Smokeless tobacco: Never Used  Substance Use Topics  . Alcohol use: Yes    Comment: occasional  . Drug use: No     Family Hx: The patient's family history includes Arthritis in his father and mother; Heart disease in his mother; Hyperlipidemia in his mother. There is no history of Colon cancer.  ROS:   Please see the history of present illness.     All other systems reviewed and are negative.   Prior CV studies:   The following studies were reviewed today:    Labs/Other Tests and Data Reviewed:    EKG:  No ECG reviewed.  Recent Labs: No results found for requested labs within last 8760 hours.   Recent Lipid Panel Lab Results  Component Value Date/Time   CHOL 177 03/18/2017 04:25 PM   TRIG 219 (H) 03/18/2017 04:25 PM   HDL 40 03/18/2017 04:25 PM   CHOLHDL 4.4 03/18/2017 04:25 PM    CHOLHDL 5 12/30/2016 01:39 PM   LDLCALC 93 03/18/2017 04:25 PM   LDLDIRECT 99.0 12/30/2016 01:39 PM    Wt Readings from Last 3 Encounters:  12/05/18 171 lb (77.6 kg)  03/23/18 168 lb (76.2 kg)  07/23/17 172 lb 4.8 oz (78.2 kg)     Objective:    Vital Signs:  Ht 5\' 7"  (1.702 m)   BMI 26.78 kg/m      ASSESSMENT & PLAN:    1. Ascending aortic aneurysm: He has a very mild dilatation of his a sending aorta.  The last CT scan measured 4.1 cm.  I will see him back in the office in approximately 6 months.  We will consider doing another CT angiogram in the next 6 to 12 months.  The aortic dilatation has been very stable and has not grown over the past several years.  We have advised him to not lift heavy weights.  Of advised him to do more cardio exercise.  2.  Hyperlipidemia: He is on atorvastatin.  Will check lipids again when I see him in 6 months.  3.  Anemia: His last hemoglobin was 10.7.  We will recheck a CBC when I see him again in 6 months.  Continue aspirin and Plavix for now.  COVID-19 Education: The signs and symptoms of COVID-19 were discussed with the patient and how to seek care for testing (follow up with PCP or arrange E-visit).  The importance of social distancing was discussed today.  Time:   Today, I have spent  18  minutes with the patient with telehealth technology discussing the above problems.     Medication Adjustments/Labs and Tests Ordered: Current medicines are reviewed at length with the patient today.  Concerns regarding medicines are outlined above.   Tests Ordered: No orders of the defined types were placed in this encounter.   Medication Changes: No orders of the defined types were placed in this encounter.   Follow Up:  In Person in 6 month(s)  Signed, Mertie Moores, MD  04/19/2019 11:31 AM    [facility]  

## 2019-04-19 NOTE — Patient Instructions (Addendum)
Medication Instructions:  Your provider recommends that you continue on your current medications as directed. Please refer to the Current Medication list given to you today.    Labwork: Please come fasting to your next appointment in 6 months.   Follow-Up: Your provider wants you to follow-up in: 6 months with Dr. Acie Fredrickson. You will receive a reminder letter in the mail two months in advance. If you don't receive a letter, please call our office to schedule the follow-up appointment.

## 2019-07-11 ENCOUNTER — Other Ambulatory Visit: Payer: Self-pay

## 2019-07-11 MED ORDER — ROSUVASTATIN CALCIUM 10 MG PO TABS: 10.0000 mg | ORAL_TABLET | Freq: Every day | ORAL | 3 refills | Status: DC

## 2019-07-13 DIAGNOSIS — F200 Paranoid schizophrenia: Secondary | ICD-10-CM | POA: Diagnosis not present

## 2019-07-27 DIAGNOSIS — K006 Disturbances in tooth eruption: Secondary | ICD-10-CM | POA: Diagnosis not present

## 2019-08-03 DIAGNOSIS — J3489 Other specified disorders of nose and nasal sinuses: Secondary | ICD-10-CM | POA: Diagnosis not present

## 2019-08-03 DIAGNOSIS — Z20828 Contact with and (suspected) exposure to other viral communicable diseases: Secondary | ICD-10-CM | POA: Diagnosis not present

## 2019-09-13 DIAGNOSIS — Z8521 Personal history of malignant neoplasm of larynx: Secondary | ICD-10-CM | POA: Diagnosis not present

## 2019-09-13 DIAGNOSIS — R070 Pain in throat: Secondary | ICD-10-CM | POA: Diagnosis not present

## 2019-10-11 DIAGNOSIS — M545 Low back pain: Secondary | ICD-10-CM | POA: Diagnosis not present

## 2019-10-18 DIAGNOSIS — Z0100 Encounter for examination of eyes and vision without abnormal findings: Secondary | ICD-10-CM | POA: Diagnosis not present

## 2019-10-21 ENCOUNTER — Encounter: Payer: Self-pay | Admitting: Cardiovascular Disease

## 2019-10-21 ENCOUNTER — Ambulatory Visit: Payer: Medicare HMO | Admitting: Cardiovascular Disease

## 2019-10-21 ENCOUNTER — Other Ambulatory Visit: Payer: Self-pay

## 2019-10-21 VITALS — BP 106/88 | HR 72 | Ht 67.0 in | Wt 170.8 lb

## 2019-10-21 DIAGNOSIS — E7800 Pure hypercholesterolemia, unspecified: Secondary | ICD-10-CM | POA: Diagnosis not present

## 2019-10-21 DIAGNOSIS — I712 Thoracic aortic aneurysm, without rupture, unspecified: Secondary | ICD-10-CM

## 2019-10-21 NOTE — Progress Notes (Signed)
Andrew Jennings Date of Birth  [birth year]       [facility]    [employer] [address] [place], [place], [place]  [address]   [place], [place]  [address] [place]   Fax  [phone]     Fax [phone]  Problem List: 1. Thoracic aortic aneurism - small  2. Hyperlipidemia 3. Laryngeal cancer ~2010, XRT 4. Bipolar disorder       [name] is a 59 year old gentleman who presents today for followup of his thoracic aortic aneurysm. He was originally told in [place] that he had a thoracic aneurysm. He was previously followed by Dr. Rollene Fare.    Occasional CP - seems musculoskelatal.  Walks 20-30 minutes a day.   Used to smoke - stopped when he developed larangyeal cancer  January 22, 2015:  Andrew [place] is seen in follow up for his ascending aortic aneurism. The MRI from 2015 reveals:  IMPRESSION: Maximal diameter of the ascending aorta above the valve has increased from 3.8 cm to 4.2 cm. At the sino-tubular junction, it has increased from 3.8 cm to 4.1 cm. It is grossly unchanged at the sinus of Valsalva.  He did not like the MRA because he was not able to hold his breath that long .  He is doing well.   Oct. 18, 2017:  Andrew [place] is seen today for follow-up of his thoracic aortic aneurysm. He had an MRI scan in 2015. He does not want have an MRI a again because it requires him to hold his breath for   He denies any chest pain or shortness of breath. His blood pressure and heart rate are normal.  Sept. 5, 2018:  Andrew [place] is seen for follow up .   Has a small thoracic arortic aneurism  Is scheduled for a CT scan soon .  The last imaging of his aorta was 2015.   Sept 10, 2019: Andrew [place] is seen today for follow-up of his thoracic aortic aneurysm and hyperlipidemia. CT angiogram performed March 25, 2017 reveals a small a sending aortic aneurysm measuring 4.1 mm in diameter.  It was unchanged compared to the July 2015  CT scan.  He was also found to have mild emphysema.  He has a history of hyperlipidemia.  He had a negative treadmill test in [place], 2018 as part of a preoperative evaluation prior to his cystoscopy.  Cystoscopy in January was clear.  He does not have any significant bladder abnormalities.  October 21, 2019:  Andrew [place] is seen today for follow-up of his hyperlipidemia and mild ascending aortic aneurysm. He is doing well.  Is not having any episodes of chest pain.  He takes his medicine regularly.  He works out with [employer].  He is concerned about elevated CPK levels but it sounds like he is not drinking enough water.  I encouraged him to drink more water to minimize muscle soreness and CPK elevations.   Current Outpatient Medications on File Prior to Visit  Medication Sig Dispense Refill  . acetaminophen (TYLENOL) 500 MG tablet Take 1,000 mg by mouth every 6 (six) hours as needed.    Marland Kitchen aspirin 81 MG tablet Take 81 mg by mouth daily.    . diphenhydrAMINE (BENADRYL) 50 MG capsule Take 300 mg by mouth at bedtime.     . haloperidol (HALDOL) 5 MG tablet Take 1 tablet by mouth daily.    Marland Kitchen levothyroxine (SYNTHROID) 150 MCG tablet Take  1 tablet by mouth daily.    . rosuvastatin (CRESTOR) 10 MG tablet Take 1 tablet (10 mg total) by mouth daily. Please keep upcoming appt for future refills. 90 tablet 3  . sildenafil (REVATIO) 20 MG tablet TAKE ONE TO FIVE TABLETS BY MOUTH DAILY AS NEEDED 20 tablet 0   No current facility-administered medications on file prior to visit.    Allergies  Allergen Reactions  . Darvocet [Propoxyphene N-Acetaminophen] Other (See Comments)    "Makes me feel strange"..Per pt   . Penicillins Other (See Comments)    Makes sick, per pt  Makes sick, per pt  . Wellbutrin [Bupropion]     Makes me feel sick    Past Medical History:  Diagnosis Date  . AAA (abdominal aortic aneurysm) ([place]) last 18 yrs    4.1 cm per chest ct 03-25-17 epic, followed by dr Cathie Olden cardiology lov  04-30-16   . Aneurysm, aorta, thoracic ([place]) 01/20/2014  . Arthritis   . Bipolar disorder ([place]) 12/19/2010  . Bulging lumbar disc   . Complication of anesthesia    "stopped breathing with old anesthesia  with colonscopy at dr perry's office 4 yrs ago, did fine with new anesthesia with colonscopy this year at dr perry's officeper pt"  . Depression   . GERD (gastroesophageal reflux disease)   . Glaucoma   . Hemorrhoids   . History of adenomatous polyp of colon   . History of exercise stress test 06-25-2017    dr Sondra Come   normal stress test without any evidence of ischemia, his chest pain atypical, his ascending aortis aneurism is small and has been stable by seial CT angiograms,  he is at low risk   . 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 Hyperlipidemia   . Hypothyroidism    secondary radiation therpy induced in 02/ 2010  . Laryngeal cancer ([place]) 12/19/2010  . OSA (obstructive sleep apnea)    does not use macine cannot afford it  . OSA on CPAP 12/25/2011  . Schizophrenia ([place])   . Scoliosis   . Ulcerative colitis 12/19/2010    Past Surgical History:  Procedure Laterality Date  . CARDIOVASCULAR STRESS TEST  03/03/2013   normal nuclear study (lexiscan no exercise) w/ no evidence ishcemia/  normal LV function and wall motion, 41%  . COLONOSCOPY  last one 01-22-2017  dr Henrene Pastor  . CYSTOSCOPY/RETROGRADE/URETEROSCOPY Right 07/23/2017   Procedure: CYSTOSCOPY RIGHT RETROGRADE;  Surgeon: [name], MD;  Location: [facility];  Service: Urology;  Laterality: Right;  . NECK SURGERY  yrs ago    BLOOD CLOT IN NECK  . TRANSTHORACIC ECHOCARDIOGRAM  12/18/2011   mild concentric LVH, ef >55%/ mild AR and MR/ trace TR/ mild PR/ mild aortic root dilatation  . UMBILICAL HERNIA REPAIR  as child    Social History   Tobacco Use  Smoking Status Former Smoker  . Types: Cigarettes  . Quit date: 05/02/2009  . Years since quitting: 10.4  Smokeless Tobacco Never Used    Social History   Substance and Sexual Activity  Alcohol Use Yes   Comment:  occasional    Family History  Problem Relation Age of Onset  . Arthritis Mother   . Hyperlipidemia Mother   . Heart disease Mother   . Arthritis Father   . Colon cancer Neg Hx     Reviw of Systems:  Reviewed in the HPI.  All other systems are negative.   Physical Exam: There were no vitals taken for this visit.  GEN:  Middle age male,  NAD  HEENT: Normal NECK: No JVD; No carotid bruits LYMPHATICS: No lymphadenopathy CARDIAC: RRR , no murmurs, rubs, gallops RESPIRATORY:  Clear to auscultation without rales, wheezing or rhonchi  ABDOMEN: Soft, non-tender, non-distended MUSCULOSKELETAL:  No edema; No deformity  SKIN: Warm and dry NEUROLOGIC:  Alert and oriented x 3    ECG:  October 21, 2019:   NSR at 65.   No ST or T wave changes.    Assessment / Plan:   1. Thoracic aortic aneurism - His thoracic aortic aneurysm is quite small.  Measures 4.2 cm at its maximum diameter.  Will repeat his CT angio      2. Hyperlipidemia -   Check  Lipids, liver enz, bmp  today   3. Laryngeal cancer    4. Bipolar disorder    Mertie Moores, MD  10/21/2019 8:22 AM    [facility] [place],  [place], [place]  [address] Pager [phone] Phone: [phone]; Fax: [phone]

## 2019-10-21 NOTE — Patient Instructions (Addendum)
Medication Instructions:  Your physician recommends that you continue on your current medications as directed. Please refer to the Current Medication list given to you today.  *If you need a refill on your cardiac medications before your next appointment, please call your pharmacy*   Lab Work: TODAY - cholesterol, liver panel, basic metabolic panel If you have labs (blood work) drawn today and your tests are completely normal, you will receive your results only by: Marland Kitchen MyChart Message (if you have MyChart) OR . A paper copy in the mail If you have any lab test that is abnormal or we need to change your treatment, we will call you to review the results.   Testing/Procedures: Non-Cardiac CT Angiography (CTA), is a special type of CT scan that uses a computer to produce multi-dimensional views of major blood vessels throughout the body. In CT angiography, a contrast material is injected through an IV to help visualize the blood vessels Monday April 12 at 1:00 - be here at 12:45 to get registered Do not eat or drink anything for 3 hours before (nothing after 10:00 am)   Follow-Up: At [facility], you and your health needs are our priority.  As part of our continuing mission to provide you with exceptional heart care, we have created designated Provider Care Teams.  These Care Teams include your primary Cardiologist (physician) and Advanced Practice Providers (APPs -  Physician Assistants and Nurse Practitioners) who all work together to provide you with the care you need, when you need it.  We recommend signing up for the patient portal called "MyChart".  Sign up information is provided on this After Visit Summary.  MyChart is used to connect with patients for Virtual Visits (Telemedicine).  Patients are able to view lab/test results, encounter notes, upcoming appointments, etc.  Non-urgent messages can be sent to your provider as well.   To learn more about what you can do with MyChart, go to  [email or web address].    Your next appointment:   1 year(s)  The format for your next appointment:   In Person  Provider:   You may see Mertie Moores, MD or one of the following Advanced Practice Providers on your designated Care Team:    Richardson Dopp, PA-C  [place], [place]  Daune Perch, [place]

## 2019-10-22 LAB — LIPID PANEL
Chol/HDL Ratio: 4.3 ratio (ref 0.0–5.0)
Cholesterol, Total: 190 mg/dL (ref 100–199)
HDL: 44 mg/dL (ref >39)
LDL Chol Calc (NIH): 113 mg/dL — ABNORMAL HIGH (ref 0–99)
Triglycerides: 191 mg/dL — ABNORMAL HIGH (ref 0–149)
VLDL Cholesterol Cal: 33 mg/dL (ref 5–40)

## 2019-10-22 LAB — BASIC METABOLIC PANEL
BUN/Creatinine Ratio: 13 (ref 10–24)
BUN: 16 mg/dL (ref 8–27)
CO2: 26 mmol/L (ref 20–29)
Calcium: 9.2 mg/dL (ref 8.6–10.2)
Chloride: 105 mmol/L (ref 96–106)
Creatinine, Ser: 1.21 mg/dL (ref 0.76–1.27)
GFR calc Af Amer: 74 mL/min/{1.73_m2} (ref >59)
GFR calc non Af Amer: 64 mL/min/{1.73_m2} (ref >59)
Glucose: 87 mg/dL (ref 65–99)
Potassium: 4.5 mmol/L (ref 3.5–5.2)
Sodium: 143 mmol/L (ref 134–144)

## 2019-10-22 LAB — HEPATIC FUNCTION PANEL
ALT: 39 IU/L (ref 0–44)
AST: 30 IU/L (ref 0–40)
Albumin: 4.3 g/dL (ref 3.8–4.8)
Alkaline Phosphatase: 61 IU/L (ref 39–117)
Bilirubin Total: 0.8 mg/dL (ref 0.0–1.2)
Bilirubin, Direct: 0.19 mg/dL (ref 0.00–0.40)
Total Protein: 6.5 g/dL (ref 6.0–8.5)

## 2019-10-24 ENCOUNTER — Telehealth: Payer: Self-pay | Admitting: Cardiovascular Disease

## 2019-10-24 ENCOUNTER — Ambulatory Visit (INDEPENDENT_AMBULATORY_CARE_PROVIDER_SITE_OTHER)
Admission: RE | Admit: 2019-10-24 | Discharge: 2019-10-24 | Disposition: A | Discharge status: Home / Self Care | Payer: Medicare HMO | Source: Ambulatory Visit | Attending: Cardiovascular Disease | Admitting: Cardiovascular Disease

## 2019-10-24 ENCOUNTER — Other Ambulatory Visit: Payer: Self-pay

## 2019-10-24 DIAGNOSIS — I712 Thoracic aortic aneurysm, without rupture, unspecified: Secondary | ICD-10-CM

## 2019-10-24 MED ORDER — IOHEXOL 350 MG/ML SOLN
100.0000 mL | Freq: Once | INTRAVENOUS | Status: AC | PRN
  Administered 2019-10-24: 100 mL via INTRAVENOUS

## 2019-10-24 NOTE — Telephone Encounter (Signed)
Returned call to patient who asks about lab results and CT results. I advised CT was just done today and I do not have the results yet. I reviewed lab results with him and advised him to continue current plan of treatment. He verbalized understanding and agreement and thanked me for the call.

## 2019-10-24 NOTE — Telephone Encounter (Signed)
Patient calling for lab test results.

## 2019-10-24 NOTE — Telephone Encounter (Signed)
See duplicate telephone encounter dated today, 4/12.

## 2019-10-24 NOTE — Telephone Encounter (Signed)
New Message    Pt is calling and is asking to speak with the Nurse  He says he would like his lab results and to talk to her about something else    Please call

## 2019-10-28 ENCOUNTER — Telehealth: Payer: Self-pay | Admitting: Cardiovascular Disease

## 2019-10-28 NOTE — Telephone Encounter (Signed)
Patient wants to know if there are any bills he has to pay to our office.

## 2019-10-28 NOTE — Telephone Encounter (Signed)
Patient states that he received a call from our office - not sure who call. I did not see a note.

## 2019-10-28 NOTE — Telephone Encounter (Signed)
Patient does not remember the name of who called him. Advised that I did not see a note of anyone that had called but if it was important that they would call him back.

## 2019-11-11 ENCOUNTER — Telehealth: Payer: Self-pay | Admitting: Cardiovascular Disease

## 2019-11-11 NOTE — Telephone Encounter (Signed)
New Message      [facility] Pre-operative Risk Assessment    Request for surgical clearance:  1. What type of surgery is being performed? 6 teeth extractions   2. When is this surgery scheduled? 11/14/2019  3. What type of clearance is required (medical clearance vs. Pharmacy clearance to hold med vs. Both)? Medical   4. Are there any medications that need to be held prior to surgery and how long? none  5. Practice name and name of physician performing surgery? [facility] Dr.Judy Walker  6. What is your office phone number [phone]   7.   What is your office fax number [phone]  8.   Anesthesia type (None, local, MAC, general) ? Local    Andrew Jennings 11/11/2019, 3:19 PM  _________________________________________________________________   (provider comments below)

## 2019-11-14 NOTE — Telephone Encounter (Signed)
   Primary Cardiologist: Mertie Moores, MD  Chart reviewed as part of pre-operative protocol coverage. Patient was contacted 11/14/2019 in reference to pre-operative risk assessment for pending surgery as outlined below.  Tiko Hasselman was last seen on 10/21/19 by Dr. Acie Fredrickson.  Since that day, Asahi Soyars has done well from a cardiac standpoint. Recent CT angio of chest showed a stable ascending thoracic aorta with recommendations for annual follow up imaging.   Therefore, based on ACC/AHA guidelines, the patient would be at acceptable risk for the planned procedure without further cardiovascular testing.   I will route this recommendation to the requesting party via Epic fax function and remove from pre-op pool.  Please call with questions.  Reino Bellis, NP 11/14/2019, 10:50 AM

## 2019-12-06 DIAGNOSIS — F200 Paranoid schizophrenia: Secondary | ICD-10-CM | POA: Diagnosis not present

## 2019-12-13 ENCOUNTER — Telehealth: Payer: Self-pay | Admitting: Cardiovascular Disease

## 2019-12-13 NOTE — Telephone Encounter (Signed)
New Message    Pt is calling and says he is going to have some dental work done and is wanting to know if he can have this done or if he needs to see Dr Acie Fredrickson first   Please advise

## 2019-12-13 NOTE — Telephone Encounter (Signed)
Returned call to patient and advised him that he may proceed with having dental work without coming into the office for a visit. He verbalized understanding and thanked me for the call.

## 2020-01-04 DIAGNOSIS — R079 Chest pain, unspecified: Secondary | ICD-10-CM | POA: Diagnosis not present

## 2020-01-17 DIAGNOSIS — F411 Generalized anxiety disorder: Secondary | ICD-10-CM | POA: Diagnosis not present

## 2020-01-17 DIAGNOSIS — M79606 Pain in leg, unspecified: Secondary | ICD-10-CM | POA: Diagnosis not present

## 2020-01-17 DIAGNOSIS — E039 Hypothyroidism, unspecified: Secondary | ICD-10-CM | POA: Diagnosis not present

## 2020-01-17 DIAGNOSIS — E785 Hyperlipidemia, unspecified: Secondary | ICD-10-CM | POA: Diagnosis not present

## 2020-01-17 DIAGNOSIS — D649 Anemia, unspecified: Secondary | ICD-10-CM | POA: Diagnosis not present

## 2020-01-24 DIAGNOSIS — K006 Disturbances in tooth eruption: Secondary | ICD-10-CM | POA: Diagnosis not present

## 2020-02-16 DIAGNOSIS — Z125 Encounter for screening for malignant neoplasm of prostate: Secondary | ICD-10-CM | POA: Diagnosis not present

## 2020-02-16 DIAGNOSIS — J439 Emphysema, unspecified: Secondary | ICD-10-CM | POA: Diagnosis not present

## 2020-02-16 DIAGNOSIS — F319 Bipolar disorder, unspecified: Secondary | ICD-10-CM | POA: Diagnosis not present

## 2020-02-16 DIAGNOSIS — Z0000 Encounter for general adult medical examination without abnormal findings: Secondary | ICD-10-CM | POA: Diagnosis not present

## 2020-02-16 DIAGNOSIS — N529 Male erectile dysfunction, unspecified: Secondary | ICD-10-CM | POA: Diagnosis not present

## 2020-02-24 DIAGNOSIS — H52223 Regular astigmatism, bilateral: Secondary | ICD-10-CM | POA: Diagnosis not present

## 2020-06-04 DIAGNOSIS — F200 Paranoid schizophrenia: Secondary | ICD-10-CM | POA: Diagnosis not present

## 2020-06-22 ENCOUNTER — Other Ambulatory Visit: Payer: Self-pay | Admitting: Cardiovascular Disease

## 2020-07-19 DIAGNOSIS — L309 Dermatitis, unspecified: Secondary | ICD-10-CM | POA: Diagnosis not present

## 2020-08-06 ENCOUNTER — Ambulatory Visit
Admission: RE | Admit: 2020-08-06 | Discharge: 2020-08-06 | Disposition: A | Discharge status: Home / Self Care | Payer: Medicare HMO | Source: Ambulatory Visit | Attending: Family Medicine | Admitting: Family Medicine

## 2020-08-06 ENCOUNTER — Other Ambulatory Visit: Payer: Self-pay | Admitting: Family Medicine

## 2020-08-06 DIAGNOSIS — M47812 Spondylosis without myelopathy or radiculopathy, cervical region: Secondary | ICD-10-CM | POA: Diagnosis not present

## 2020-08-06 DIAGNOSIS — R222 Localized swelling, mass and lump, trunk: Secondary | ICD-10-CM | POA: Diagnosis not present

## 2020-08-06 DIAGNOSIS — W19XXXA Unspecified fall, initial encounter: Secondary | ICD-10-CM

## 2020-08-06 DIAGNOSIS — Y92009 Unspecified place in unspecified non-institutional (private) residence as the place of occurrence of the external cause: Secondary | ICD-10-CM

## 2020-08-06 DIAGNOSIS — M542 Cervicalgia: Secondary | ICD-10-CM | POA: Diagnosis not present

## 2020-08-06 DIAGNOSIS — J439 Emphysema, unspecified: Secondary | ICD-10-CM | POA: Diagnosis not present

## 2020-08-06 DIAGNOSIS — L309 Dermatitis, unspecified: Secondary | ICD-10-CM | POA: Diagnosis not present

## 2020-08-06 DIAGNOSIS — F319 Bipolar disorder, unspecified: Secondary | ICD-10-CM | POA: Diagnosis not present

## 2020-08-27 DIAGNOSIS — F200 Paranoid schizophrenia: Secondary | ICD-10-CM | POA: Diagnosis not present

## 2020-10-28 ENCOUNTER — Encounter: Payer: Self-pay | Admitting: Cardiovascular Disease

## 2020-10-28 NOTE — Progress Notes (Signed)
Andrew Jennings Date of Birth  [birth year]       [facility]    [employer] [address], [place], [place]  [address]   [place], [place]  [address] [address]   Fax  [phone]     Fax [phone]  Problem List: 1. Thoracic aortic aneurism - small  2. Hyperlipidemia 3. Laryngeal cancer ~2010, XRT 4. Bipolar disorder       Andrew Jennings is a 60 year old gentleman who presents today for followup of his thoracic aortic aneurysm. He was originally told in [place] that he had a thoracic aneurysm. He was previously followed by Dr. Rollene Fare.    Occasional CP - seems musculoskelatal.  Walks 20-30 minutes a day.   Used to smoke - stopped when he developed larangyeal cancer  January 22, 2015:  Andrew Jennings is seen in follow up for his ascending aortic aneurism. The MRI from 2015 reveals:  IMPRESSION: Maximal diameter of the ascending aorta above the valve has increased from 3.8 cm to 4.2 cm. At the sino-tubular junction, it has increased from 3.8 cm to 4.1 cm. It is grossly unchanged at the sinus of Valsalva.  He did not like the MRA because he was not able to hold his breath that long .  He is doing well.   Oct. 18, 2017:  Andrew Jennings is seen today for follow-up of his thoracic aortic aneurysm. He had an MRI scan in 2015. He does not want have an MRI a again because it requires him to hold his breath for   He denies any chest pain or shortness of breath. His blood pressure and heart rate are normal.  Sept. 5, 2018:  Andrew Jennings is seen for follow up .   Has a small thoracic arortic aneurism  Is scheduled for a CT scan soon .  The last imaging of his aorta was 2015.   Sept 10, 2019: Andrew Jennings is seen today for follow-up of his thoracic aortic aneurysm and hyperlipidemia. CT angiogram performed March 25, 2017 reveals a small a sending aortic aneurysm measuring 4.1 mm in diameter.  It was unchanged compared to the July 2015  CT scan.  He was also found to have mild emphysema.  He has a history of hyperlipidemia.  He had a negative treadmill test in [place], 2018 as part of a preoperative evaluation prior to his cystoscopy.  Cystoscopy in January was clear.  He does not have any significant bladder abnormalities.  October 21, 2019:  Andrew Jennings is seen today for follow-up of his hyperlipidemia and mild ascending aortic aneurysm. He is doing well.  Is not having any episodes of chest pain.  He takes his medicine regularly.  He works out with [employer].  He is concerned about elevated CPK levels but it sounds like he is not drinking enough water.  I encouraged him to drink more water to minimize muscle soreness and CPK elevations.   October 29, 2020: Andrew Jennings is seen today for follow up of his HLD, mild ascending aortic aneurism Repeat CTA of aorta  Trying to do some exercise .  Lifting some weights    Current Outpatient Medications on File Prior to Visit  Medication Sig Dispense Refill  . acetaminophen (TYLENOL) 500 MG tablet Take 1,000 mg by mouth every 6 (six) hours as needed.    Marland Kitchen aspirin 81 MG tablet Take 81 mg by mouth daily.    . diclofenac (VOLTAREN) 75  MG EC tablet Take 2 tablets by mouth daily.    . diphenhydrAMINE (BENADRYL) 50 MG capsule Take 300 mg by mouth at bedtime.    . haloperidol (HALDOL) 20 MG tablet Take 20 mg by mouth once. Can take extra 5mg  if needed    . levothyroxine (SYNTHROID) 150 MCG tablet Take 1 tablet by mouth daily.    . rosuvastatin (CRESTOR) 10 MG tablet TAKE 1 TABLET BY MOUTH DAILY 90 tablet 1  . sildenafil (REVATIO) 20 MG tablet TAKE ONE TO FIVE TABLETS BY MOUTH DAILY AS NEEDED 20 tablet 0   No current facility-administered medications on file prior to visit.    Allergies  Allergen Reactions  . Darvocet [Propoxyphene N-Acetaminophen] Other (See Comments)    "Makes me feel strange"..Per pt   . Penicillins Other (See Comments)    Makes sick, per pt  Makes sick, per pt  . Wellbutrin  [Bupropion]     Makes me feel sick    Past Medical History:  Diagnosis Date  . AAA (abdominal aortic aneurysm) ([place]) last 18 yrs    4.1 cm per chest ct 03-25-17 epic, followed by dr Cathie Olden cardiology lov 04-30-16   . Aneurysm, aorta, thoracic ([place]) 01/20/2014  . Arthritis   . Bipolar disorder ([place]) 12/19/2010  . Bulging lumbar disc   . Complication of anesthesia    "stopped breathing with old anesthesia  with colonscopy at dr perry's office 4 yrs ago, did fine with new anesthesia with colonscopy this year at dr perry's officeper pt"  . Depression   . GERD (gastroesophageal reflux disease)   . Glaucoma   . Hemorrhoids   . History of adenomatous polyp of colon   . History of exercise stress test 06-25-2017    dr Sondra Come   normal stress test without any evidence of ischemia, his chest pain atypical, his ascending aortis aneurism is small and has been stable by seial CT angiograms,  he is at low risk   . 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 Hyperlipidemia   . Hypothyroidism    secondary radiation therpy induced in 02/ 2010  . Laryngeal cancer ([place]) 12/19/2010  . OSA (obstructive sleep apnea)    does not use macine cannot afford it  . OSA on CPAP 12/25/2011  . Schizophrenia ([place])   . Scoliosis   . Ulcerative colitis 12/19/2010    Past Surgical History:  Procedure Laterality Date  . CARDIOVASCULAR STRESS TEST  03/03/2013   normal nuclear study (lexiscan no exercise) w/ no evidence ishcemia/  normal LV function and wall motion, 41%  . COLONOSCOPY  last one 01-22-2017  dr Henrene Pastor  .  CYSTOSCOPY/RETROGRADE/URETEROSCOPY Right 07/23/2017   Procedure: CYSTOSCOPY RIGHT RETROGRADE;  Surgeon: [name], MD;  [facility]: [facility];  Service: Urology;  Laterality: Right;  . NECK SURGERY  yrs ago   BLOOD CLOT IN NECK  . TRANSTHORACIC ECHOCARDIOGRAM  12/18/2011   mild concentric LVH, ef >55%/ mild AR and MR/ trace TR/ mild PR/ mild aortic root dilatation  . UMBILICAL HERNIA REPAIR  as child    Social History  Tobacco Use  Smoking Status Former Smoker  . Types: Cigarettes  . Quit date: 05/02/2009  . Years since quitting: 11.5  Smokeless Tobacco Never Used    Social History   Substance and Sexual Activity  Alcohol Use Yes   Comment: occasional    Family History  Problem Relation Age of Onset  . Arthritis Mother   . Hyperlipidemia Mother   . Heart disease Mother   . Arthritis Father   . Colon cancer Neg Hx     Reviw of Systems:  Reviewed in the HPI.  All other systems are negative.  Physical Exam: Blood pressure 98/62, pulse 79, height 5\' 7"  (1.702 m), weight 169 lb (76.7 kg), SpO2 96 %.  GEN:  Well nourished, well developed in no acute distress HEENT: Normal NECK: No JVD; No carotid bruits LYMPHATICS: No lymphadenopathy CARDIAC: RRR , no murmurs, rubs, gallops RESPIRATORY:  Clear to auscultation without rales, wheezing or rhonchi  ABDOMEN: Soft, non-tender, non-distended MUSCULOSKELETAL:  No edema; No deformity  SKIN: Warm and dry NEUROLOGIC:  Alert and oriented x 3    ECG:   October 29, 2020:  NSR at 78.  No ST or T wave chagnes.    Assessment / Plan:   1. Thoracic aortic aneurism - His thoracic aortic aneurysm is quite small.  Measures 4.2 cm at its  Will repeat CT angio of his aorta - he wants to get it in July    2. Hyperlipidemia -    Check labs today , continue rosuvastatin     3. Laryngeal cancer    4. Bipolar disorder / Autism :  Stable    Mertie Moores, MD  10/29/2020 2:51 PM    [facility] [place],  [place], [place]  [address] Pager [phone] Phone: [phone]; Fax: [phone]

## 2020-10-29 ENCOUNTER — Encounter: Payer: Self-pay | Admitting: Cardiovascular Disease

## 2020-10-29 ENCOUNTER — Ambulatory Visit: Payer: Medicare HMO | Admitting: Cardiovascular Disease

## 2020-10-29 ENCOUNTER — Other Ambulatory Visit: Payer: Self-pay

## 2020-10-29 DIAGNOSIS — I712 Thoracic aortic aneurysm, without rupture, unspecified: Secondary | ICD-10-CM

## 2020-10-29 NOTE — Patient Instructions (Signed)
Medication Instructions:  Your physician recommends that you continue on your current medications as directed. Please refer to the Current Medication list given to you today.  *If you need a refill on your cardiac medications before your next appointment, please call your pharmacy*   Lab Work: TODAY: Lipids, alt, bmet If you have labs (blood work) drawn today and your tests are completely normal, you will receive your results only by: Marland Kitchen MyChart Message (if you have MyChart) OR . A paper copy in the mail If you have any lab test that is abnormal or we need to change your treatment, we will call you to review the results.   Testing/Procedures: CT Angiography (CTA), is a special type of CT scan that uses a computer to produce multi-dimensional views of major blood vessels throughout the body. In CT angiography, a contrast material is injected through an IV to help visualize the blood vessels    Follow-Up: At [facility], you and your health needs are our priority.  As part of our continuing mission to provide you with exceptional heart care, we have created designated Provider Care Teams.  These Care Teams include your primary Cardiologist (physician) and Advanced Practice Providers (APPs -  Physician Assistants and Nurse Practitioners) who all work together to provide you with the care you need, when you need it.  We recommend signing up for the patient portal called "MyChart".  Sign up information is provided on this After Visit Summary.  MyChart is used to connect with patients for Virtual Visits (Telemedicine).  Patients are able to view lab/test results, encounter notes, upcoming appointments, etc.  Non-urgent messages can be sent to your provider as well.   To learn more about what you can do with MyChart, go to [email or web address].    Your next appointment:   1 year(s)  The format for your next appointment:   In Person  Provider:   You may see Mertie Moores, MD or one of  the following Advanced Practice Providers on your designated Care Team:    Richardson Dopp, PA-C  [place], [place]

## 2020-10-30 LAB — BASIC METABOLIC PANEL
BUN/Creatinine Ratio: 16 (ref 10–24)
BUN: 14 mg/dL (ref 8–27)
CO2: 23 mmol/L (ref 20–29)
Calcium: 9.5 mg/dL (ref 8.6–10.2)
Chloride: 104 mmol/L (ref 96–106)
Creatinine, Ser: 0.85 mg/dL (ref 0.76–1.27)
Glucose: 88 mg/dL (ref 65–99)
Potassium: 4.6 mmol/L (ref 3.5–5.2)
Sodium: 144 mmol/L (ref 134–144)
eGFR: 98 mL/min/{1.73_m2} (ref >59)

## 2020-10-30 LAB — LIPID PANEL
Chol/HDL Ratio: 4.5 ratio (ref 0.0–5.0)
Cholesterol, Total: 179 mg/dL (ref 100–199)
HDL: 40 mg/dL (ref >39)
LDL Chol Calc (NIH): 82 mg/dL (ref 0–99)
Triglycerides: 354 mg/dL — ABNORMAL HIGH (ref 0–149)
VLDL Cholesterol Cal: 57 mg/dL — ABNORMAL HIGH (ref 5–40)

## 2020-10-30 LAB — ALT: ALT: 21 IU/L (ref 0–44)

## 2020-10-31 ENCOUNTER — Telehealth: Payer: Self-pay | Admitting: Cardiovascular Disease

## 2020-10-31 ENCOUNTER — Telehealth: Payer: Self-pay

## 2020-10-31 DIAGNOSIS — E782 Mixed hyperlipidemia: Secondary | ICD-10-CM

## 2020-10-31 MED ORDER — FENOFIBRATE 145 MG PO TABS: 145.0000 mg | ORAL_TABLET | Freq: Every day | ORAL | 3 refills | Status: DC

## 2020-10-31 NOTE — Telephone Encounter (Signed)
Follow Up:     Pt is returning Kaylee's call, concerning his lab results.

## 2020-10-31 NOTE — Telephone Encounter (Signed)
Patient called requesting refill for statin medication. Says Dr. Acie Fredrickson switched him off of Crestor to something else but does not remember what it was. Please call patient back and send refill of new medication to [facility] [address] in [place]. Thank you.

## 2020-10-31 NOTE — Telephone Encounter (Signed)
RN returned call to patient to discuss lab results. Medication orders entered and labs scheduled for 7/13.

## 2020-10-31 NOTE — Telephone Encounter (Signed)
RN spoke to patient regarding medication. RN advised we were adding the fenofibrate daily in addition to the crestor that he takes daily. Patient verbalized understanding.

## 2020-10-31 NOTE — Telephone Encounter (Signed)
RN spoke to patient regarding medication. RN advised that new medication is called fenofibrate (Tricor). Patient verbalized understanding. Patient wanted to make sure that medication was safe. RN advised medication was safe to take. Patient thanked Therapist, sports for calling.

## 2020-10-31 NOTE — Telephone Encounter (Signed)
Pt states he was supposed to be taking a new medicine but is unsure of the name of the med and he needs to know what the side effects are. Please advise

## 2020-11-01 ENCOUNTER — Telehealth: Payer: Self-pay | Admitting: Cardiovascular Disease

## 2020-11-01 NOTE — Telephone Encounter (Signed)
RN spoke to patient to advise of limiting excessive alcohol intake due to the risk of liver damage. Patient states he has been decreasing the amount of alcohol he is consuming and verbalized understanding. RN encouraged patient to contact the office with any questions or concerns.

## 2020-11-01 NOTE — Telephone Encounter (Signed)
Pt should be limiting his alcohol due to his elevated triglycerides which is why he was started on fenofibrate in the first place. Drinking excessive alcohol while on cholesterol medications in general can result in liver damage and is not recommended.

## 2020-11-01 NOTE — Telephone Encounter (Signed)
Patient is following up regarding the Fenofibrate. He would like to know if he can take it with beer or other alcoholic beverages.

## 2020-11-01 NOTE — Telephone Encounter (Signed)
Pt c/o medication issue:  1. Name of Medication: fenofibrate (TRICOR) 145 MG tablet  2. How are you currently taking this medication (dosage and times per day)? 1 tablet daily.  3. Are you having a reaction (difficulty breathing--STAT)? no  4. What is your medication issue? Patient states he needs to know if he can have at least 3 beers with the medication. He states if he cannot he needs to be switched to a medication. He states he needs to know in the next 35 minutes, because he is leaving.

## 2020-11-05 ENCOUNTER — Other Ambulatory Visit: Payer: Self-pay

## 2020-11-05 MED ORDER — FENOFIBRATE 145 MG PO TABS: 145.0000 mg | ORAL_TABLET | Freq: Every day | ORAL | 3 refills | Status: DC

## 2020-11-29 DIAGNOSIS — F200 Paranoid schizophrenia: Secondary | ICD-10-CM | POA: Diagnosis not present

## 2020-12-26 ENCOUNTER — Telehealth: Payer: Self-pay | Admitting: Cardiovascular Disease

## 2020-12-26 MED ORDER — FENOFIBRATE 145 MG PO TABS: 145.0000 mg | ORAL_TABLET | Freq: Every day | ORAL | 3 refills | Status: DC

## 2020-12-26 NOTE — Telephone Encounter (Signed)
Pt's medication was sent to pt's pharmacy as requested. Confirmation received.  °

## 2020-12-26 NOTE — Telephone Encounter (Signed)
PT states that he is using a new pharmacy, [facility] [phone], he is wanting all of his medications to be sent through them as well as his fenofibrate (TRICOR) 145 MG tablet, he has 24 pills remaining and normally receives a 90 day supply, please advise

## 2020-12-26 NOTE — Telephone Encounter (Signed)
Refills, please send in pts cardiac meds to new pharmacy location. Thanks!

## 2021-01-08 ENCOUNTER — Other Ambulatory Visit: Payer: Self-pay

## 2021-01-08 DIAGNOSIS — E782 Mixed hyperlipidemia: Secondary | ICD-10-CM

## 2021-01-08 DIAGNOSIS — E7800 Pure hypercholesterolemia, unspecified: Secondary | ICD-10-CM

## 2021-01-08 DIAGNOSIS — D649 Anemia, unspecified: Secondary | ICD-10-CM

## 2021-01-08 DIAGNOSIS — I712 Thoracic aortic aneurysm, without rupture, unspecified: Secondary | ICD-10-CM

## 2021-01-11 DIAGNOSIS — I712 Thoracic aortic aneurysm, without rupture: Secondary | ICD-10-CM | POA: Diagnosis not present

## 2021-01-23 ENCOUNTER — Other Ambulatory Visit: Payer: Self-pay

## 2021-01-23 ENCOUNTER — Ambulatory Visit (INDEPENDENT_AMBULATORY_CARE_PROVIDER_SITE_OTHER)
Admission: RE | Admit: 2021-01-23 | Discharge: 2021-01-23 | Disposition: A | Discharge status: Home / Self Care | Payer: Medicare HMO | Source: Ambulatory Visit | Attending: Cardiovascular Disease | Admitting: Cardiovascular Disease

## 2021-01-23 ENCOUNTER — Other Ambulatory Visit: Payer: Medicare HMO | Admitting: *Deleted

## 2021-01-23 DIAGNOSIS — E782 Mixed hyperlipidemia: Secondary | ICD-10-CM | POA: Diagnosis not present

## 2021-01-23 DIAGNOSIS — I712 Thoracic aortic aneurysm, without rupture, unspecified: Secondary | ICD-10-CM

## 2021-01-23 MED ORDER — IOHEXOL 350 MG/ML SOLN
100.0000 mL | Freq: Once | INTRAVENOUS | Status: AC | PRN
  Administered 2021-01-23: 100 mL via INTRAVENOUS

## 2021-01-24 ENCOUNTER — Telehealth: Payer: Self-pay | Admitting: Cardiovascular Disease

## 2021-01-24 LAB — LIPID PANEL
Chol/HDL Ratio: 4.8 ratio (ref 0.0–5.0)
Cholesterol, Total: 230 mg/dL — ABNORMAL HIGH (ref 100–199)
HDL: 48 mg/dL (ref >39)
LDL Chol Calc (NIH): 160 mg/dL — ABNORMAL HIGH (ref 0–99)
Triglycerides: 124 mg/dL (ref 0–149)
VLDL Cholesterol Cal: 22 mg/dL (ref 5–40)

## 2021-01-24 LAB — ALT: ALT: 24 IU/L (ref 0–44)

## 2021-01-24 NOTE — Telephone Encounter (Signed)
Pt aware office will call once test has been read/reviewed by MD.

## 2021-01-24 NOTE — Telephone Encounter (Signed)
Pt is calling in regards to getting his results from his recent CT and Lab work from 01/23/21. Please advise pt further

## 2021-01-25 ENCOUNTER — Other Ambulatory Visit: Payer: Self-pay | Admitting: *Deleted

## 2021-01-25 MED ORDER — ROSUVASTATIN CALCIUM 10 MG PO TABS: 10.0000 mg | ORAL_TABLET | Freq: Every day | ORAL | 3 refills | Status: DC

## 2021-01-30 ENCOUNTER — Telehealth: Payer: Self-pay | Admitting: Cardiovascular Disease

## 2021-01-30 NOTE — Telephone Encounter (Signed)
Patient stated he was returning call. Ask that you call back on his cell number

## 2021-01-30 NOTE — Telephone Encounter (Signed)
Reviewed results please see result note.

## 2021-01-31 NOTE — Telephone Encounter (Signed)
Spoke with patient and advised that we would like to repeat his cholesterol panel in 3 months due to the elevated finding and the change in medications. He states he cannot afford to come in for lab work at a later time. He would like to get his cholesterol checked when he sees his PCP on 8/18. I asked him to have the lab results sent to [place] and that we will not have him repeat in 3 months. He verbalized understanding and agreement and thanked me for the call.

## 2021-02-25 DIAGNOSIS — H524 Presbyopia: Secondary | ICD-10-CM | POA: Diagnosis not present

## 2021-02-28 ENCOUNTER — Telehealth: Payer: Self-pay | Admitting: Cardiovascular Disease

## 2021-02-28 DIAGNOSIS — Z125 Encounter for screening for malignant neoplasm of prostate: Secondary | ICD-10-CM | POA: Diagnosis not present

## 2021-02-28 DIAGNOSIS — E039 Hypothyroidism, unspecified: Secondary | ICD-10-CM | POA: Diagnosis not present

## 2021-02-28 DIAGNOSIS — E785 Hyperlipidemia, unspecified: Secondary | ICD-10-CM | POA: Diagnosis not present

## 2021-02-28 DIAGNOSIS — N529 Male erectile dysfunction, unspecified: Secondary | ICD-10-CM | POA: Diagnosis not present

## 2021-02-28 DIAGNOSIS — Z23 Encounter for immunization: Secondary | ICD-10-CM | POA: Diagnosis not present

## 2021-02-28 DIAGNOSIS — F209 Schizophrenia, unspecified: Secondary | ICD-10-CM | POA: Diagnosis not present

## 2021-02-28 DIAGNOSIS — Z0000 Encounter for general adult medical examination without abnormal findings: Secondary | ICD-10-CM | POA: Diagnosis not present

## 2021-02-28 NOTE — Telephone Encounter (Signed)
Faxed Pt's last lipid profile results and request for new lipid profile as requested.

## 2021-02-28 NOTE — Telephone Encounter (Signed)
Patient is getting a physical at Dr. Arnetha Courser office today. He needs our office to fax Dr. Orland Mustard what kind of labs Dr. Acie Fredrickson needs, so that Dr. Orland Mustard can do them today.  Please fax lab orders to [phone]

## 2021-03-04 DIAGNOSIS — F200 Paranoid schizophrenia: Secondary | ICD-10-CM | POA: Diagnosis not present

## 2021-03-14 ENCOUNTER — Telehealth: Payer: Self-pay | Admitting: Cardiovascular Disease

## 2021-03-14 NOTE — Telephone Encounter (Signed)
Pt c/o medication issue:  1. Name of Medication: fenofibrate (TRICOR) 145 MG tablet  2. How are you currently taking this medication (dosage and times per day)? 1 tablet by mouth  3. Are you having a reaction (difficulty breathing--STAT)? no  4. What is your medication issue? Calling to see if he is supposed to continue to take this

## 2021-03-14 NOTE — Telephone Encounter (Signed)
Called patient and advised him to continue fenofibrate. He asks if he we received the lab results from his PCP and I confirmed. I advised that his lipid levels have improved slightly and to continue his current medications until further notice. He verbalized understanding and agreement and thanked me for the call.

## 2021-06-03 DIAGNOSIS — F200 Paranoid schizophrenia: Secondary | ICD-10-CM | POA: Diagnosis not present

## 2021-10-09 ENCOUNTER — Encounter: Payer: Self-pay | Admitting: Cardiovascular Disease

## 2021-10-09 NOTE — Telephone Encounter (Signed)
error 

## 2021-10-21 DIAGNOSIS — F200 Paranoid schizophrenia: Secondary | ICD-10-CM | POA: Diagnosis not present

## 2021-10-24 ENCOUNTER — Ambulatory Visit: Payer: Medicare HMO | Admitting: Cardiovascular Disease

## 2021-10-24 ENCOUNTER — Other Ambulatory Visit: Payer: Self-pay | Admitting: Cardiovascular Disease

## 2021-10-28 ENCOUNTER — Ambulatory Visit: Payer: Medicare HMO | Admitting: Cardiovascular Disease

## 2021-11-13 DIAGNOSIS — R519 Headache, unspecified: Secondary | ICD-10-CM | POA: Diagnosis not present

## 2021-11-13 DIAGNOSIS — M542 Cervicalgia: Secondary | ICD-10-CM | POA: Diagnosis not present

## 2021-11-14 ENCOUNTER — Encounter: Payer: Self-pay | Admitting: Cardiovascular Disease

## 2021-11-14 ENCOUNTER — Telehealth: Payer: Self-pay | Admitting: Cardiovascular Disease

## 2021-11-14 NOTE — Telephone Encounter (Signed)
Patient was to speak to nurse, would not go into further details.  ?

## 2021-11-14 NOTE — Progress Notes (Signed)
? ? ? ?Technical brewer ?Date of Birth  10-Sep-1960 ?      ?[facility] [address], [place], [address] ?[place], [place]  [address]   [place], [place]  [address] ?[phone]     [phone]   ?Fax  [phone]     Fax [phone] ? ?Problem List: ?1. Thoracic aortic aneurism - small  ?2. Hyperlipidemia ?3. Laryngeal cancer ~2010, XRT ?4. Bipolar disorder  ? ? ?  ? ?Andrew Jennings is a 61 year old gentleman who presents today for followup of his thoracic aortic aneurysm. He was originally told in [place] that he had a thoracic aneurysm. He was previously followed by Dr. Rollene Fare.   ? ?Occasional CP - seems musculoskelatal.  Walks 20-30 minutes a day.   ?Used to smoke - stopped when he developed larangyeal cancer ? ?January 22, 2015: ? ?Andrew Jennings is seen in follow up for his ascending aortic aneurism. ?The MRI from 2015 reveals: ? ?IMPRESSION: ?Maximal diameter of the ascending aorta above the valve has ?increased from 3.8 cm to 4.2 cm. At the sino-tubular junction, it ?has increased from 3.8 cm to 4.1 cm. It is grossly unchanged at the ?sinus of Valsalva. ? ?He did not like the MRA because he was not able to hold his breath that long . ? ?He is doing well. ? ? ?Oct. 18, 2017: ? ?Andrew Jennings is seen today for follow-up of his thoracic aortic aneurysm. ?He had an MRI scan in 2015. He does not want have an MRI a again because it requires him to hold his breath for  ? ?He denies any chest pain or shortness of breath. His blood pressure and heart rate are normal. ? ?Sept. 5, 2018: ? ?Andrew Jennings is seen for follow up .   ?Has a small thoracic arortic aneurism  ?Is scheduled for a CT scan soon .  The last imaging of his aorta was 2015.  ? ?Sept 10, 2019: ?Andrew Jennings is seen today for follow-up of his thoracic aortic aneurysm and hyperlipidemia. ?CT angiogram performed March 25, 2017 reveals a small a sending aortic aneurysm measuring 4.1 mm in diameter.  It was unchanged compared to the July 2015  CT scan.  He was also found to have mild emphysema.  He has a history of hyperlipidemia. ? ?He had a negative treadmill test in December, 2018 as part of a preoperative evaluation prior to his cystoscopy.  Cystoscopy in January was clear.  He does not have any significant bladder abnormalities. ? ?October 21, 2019: ? ?Andrew Jennings is seen today for follow-up of his hyperlipidemia and mild ascending aortic aneurysm. ?He is doing well.  Is not having any episodes of chest pain.  He takes his medicine regularly.  He works out with [employer].  He is concerned about elevated CPK levels but it sounds like he is not drinking enough water.  I encouraged him to drink more water to minimize muscle soreness and CPK elevations. ? ? ?October 29, 2020: ?Andrew Jennings is seen today for follow up of his HLD, mild ascending aortic aneurism ?Repeat CTA of aorta  ?Trying to do some exercise .  ?Lifting some weights  ? ? ?Nov 15, 2021 ?Andrew Jennings is seen for follow up of his ascending aortic aneurism and HLD ? ?He is due to repeat his CTA of the aorta to follow up with his asc. Aortic aneurism  ? ? ?Current Outpatient Medications on File Prior to Visit  ?Medication Sig Dispense Refill  ?  acetaminophen (TYLENOL) 500 MG tablet Take 1,000 mg by mouth every 6 (six) hours as needed.    ? aspirin 81 MG tablet Take 81 mg by mouth daily.    ? diphenhydrAMINE (BENADRYL) 50 MG capsule Take 300 mg by mouth at bedtime.    ? fenofibrate (TRICOR) 145 MG tablet TAKE 1 TABLET EVERY DAY 90 tablet 0  ? haloperidol (HALDOL) 10 MG tablet Take 10 mg by mouth 2 (two) times daily.    ? haloperidol (HALDOL) 5 MG tablet Take 5 mg by mouth at bedtime.    ? levothyroxine (SYNTHROID) 150 MCG tablet Take 1 tablet by mouth daily.    ? rosuvastatin (CRESTOR) 10 MG tablet TAKE 1 TABLET EVERY DAY 90 tablet 0  ? sildenafil (REVATIO) 20 MG tablet TAKE ONE TO FIVE TABLETS BY MOUTH DAILY AS NEEDED 20 tablet 0  ? diclofenac (VOLTAREN) 75 MG EC tablet Take 2 tablets by mouth daily. (Patient not taking:  Reported on 11/15/2021)    ? ?No current facility-administered medications on file prior to visit.  ? ? ?Allergies  ?Allergen Reactions  ? Darvocet [Propoxyphene N-Acetaminophen] Other (See Comments)  ?  "Makes me feel strange"..Per pt ?  ? Penicillins Other (See Comments)  ?  Makes sick, per pt ? ?Makes sick, per pt  ? Wellbutrin [Bupropion]   ?  Makes me feel sick  ? ? ?Past Medical History:  ?Diagnosis Date  ? AAA (abdominal aortic aneurysm) ([facility]) last 18 yrs   ? 4.1 cm per chest ct 03-25-17 epic, followed by dr Cathie Olden cardiology lov 04-30-16   ? Aneurysm, aorta, thoracic ([facility]) 01/20/2014  ? Arthritis   ? Bipolar disorder ([facility]) 12/19/2010  ? Bulging lumbar disc   ? Complication of anesthesia   ? "stopped breathing with old anesthesia  with colonscopy at dr perry's office 4 yrs ago, did fine with new anesthesia with colonscopy this year at dr perry's officeper pt"  ? Depression   ? GERD (gastroesophageal reflux disease)   ? Glaucoma   ? Hemorrhoids   ? History of adenomatous polyp of colon   ? History of exercise stress test 06-25-2017    dr Sondra Come  ? normal stress test without any evidence of ischemia, his chest pain atypical, his ascending aortis aneurism is small and has been stable by seial CT angiograms,  he is at low risk   ? History of head and neck radiation   ? 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 History of laryngeal cancer   ? 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 Hyperlipidemia   ? Hypothyroidism   ? secondary radiation therpy induced in 02/ 2010  ? Laryngeal cancer ([facility]) 12/19/2010  ? OSA (obstructive sleep apnea)   ? does not use macine cannot afford it  ? OSA on CPAP 12/25/2011  ? Schizophrenia ([facility])   ? Scoliosis   ?  Ulcerative colitis 12/19/2010  ? ? ?Past Surgical History:  ?Procedure Laterality Date  ? CARDIOVASCULAR STRESS TEST  03/03/2013  ? normal nuclear study (lexiscan no exercise) w/ no evidence ishcemia/  normal LV function and wall motion, 41%  ? COLONOSCOPY  last one 01-22-2017  dr Henrene Pastor  ? CYSTOSCOPY/RETROGRADE/URETEROSCOPY Right 07/23/2017  ? Procedure: CYSTOSCOPY RIGHT  RETROGRADE;  Surgeon: [name], MD;  Location: [facility];  Service: Urology;  Laterality: Right;  ? NECK SURGERY  yrs ago  ? BLOOD CLOT IN NECK  ? TRANSTHORACIC ECHOCARDIOGRAM  12/18/2011  ? mild concentric LVH, ef >55%/ mild AR and MR/ trace TR/ mild PR/ mild aortic root dilatation  ? UMBILICAL HERNIA REPAIR  as child  ? ? ?Social History  ? ?Tobacco Use  ?Smoking Status Former  ? Types: Cigarettes  ? Quit date: 05/02/2009  ? Years since quitting: 12.5  ?Smokeless Tobacco Never  ? ? ?Social History  ? ?Substance and Sexual Activity  ?Alcohol Use Yes  ? Comment: occasional  ? ? ?Family History  ?Problem Relation Age of Onset  ? Arthritis Mother   ? Hyperlipidemia Mother   ? Heart disease Mother   ? Arthritis Father   ? Colon cancer Neg Hx   ? ? ?Reviw of Systems:  ?Reviewed in the HPI.  All other systems are negative. ? ?Physical Exam: ?Blood pressure 102/62, pulse 77, height '5\' 7"'$  (1.702 m), weight 159 lb 6.4 oz (72.3 kg), SpO2 97 %. ? ?GEN:  Well nourished, well developed in no acute distress ?HEENT: Normal ?NECK: No JVD; No carotid bruits ?LYMPHATICS: No lymphadenopathy ?CARDIAC: RRR , no murmurs, rubs, gallops ?RESPIRATORY:  Clear to auscultation without rales, wheezing or rhonchi  ?ABDOMEN: Soft, non-tender, non-distended ?MUSCULOSKELETAL:  No edema; No deformity  ?SKIN: Warm and dry ?NEUROLOGIC:  Alert and oriented x 3,  pressured speech which is typical for him  ? ? ? ?ECG: Nov 15, 2021: ?Normal sinus rhythm at 77.  Normal EKG. ? ? ?Assessment / Plan:  ? ?1. Thoracic aortic aneurism - his ascending aorta is 4.7 cm. ?Repeat CTA  in July, 2023 ? ? ?2. Hyperlipidemia -    stable .  Work on diet, exercise  ? ? ?3. Laryngeal cancer   ? ?4. Bipolar disorder / Autism :  Stable  ? ? ?Mertie Moores, MD  ?11/15/2021 11:21 AM    ?[place]

## 2021-11-15 ENCOUNTER — Ambulatory Visit: Payer: Medicare HMO | Admitting: Cardiovascular Disease

## 2021-11-15 ENCOUNTER — Encounter: Payer: Self-pay | Admitting: Cardiovascular Disease

## 2021-11-15 VITALS — BP 102/62 | HR 77 | Ht 67.0 in | Wt 159.4 lb

## 2021-11-15 DIAGNOSIS — I7121 Aneurysm of the ascending aorta, without rupture: Secondary | ICD-10-CM | POA: Diagnosis not present

## 2021-11-15 NOTE — Patient Instructions (Signed)
Medication Instructions:  ?Your physician recommends that you continue on your current medications as directed. Please refer to the Current Medication list given to you today. ? ?*If you need a refill on your cardiac medications before your next appointment, please call your pharmacy* ? ?Testing/Procedures: ?Your provider has recommended that your have a chest CTA scan.  ? ?Follow-Up: ?At [facility], you and your health needs are our priority.  As part of our continuing mission to provide you with exceptional heart care, we have created designated Provider Care Teams.  These Care Teams include your primary Cardiologist (physician) and Advanced Practice Providers (APPs -  Physician Assistants and Nurse Practitioners) who all work together to provide you with the care you need, when you need it.  ? ?Your next appointment:   ?1 year(s) ? ?The format for your next appointment:   ?In Person ? ?Provider:   ?Mertie Moores, MD   ? ? ?Important Information About [place] ? ? ? ? ?  ?

## 2021-11-15 NOTE — Telephone Encounter (Signed)
Pt seen in office today and spoke directly with MD-no further questions ?

## 2021-12-12 ENCOUNTER — Telehealth: Payer: Self-pay | Admitting: Cardiovascular Disease

## 2021-12-12 NOTE — Telephone Encounter (Signed)
Pt is requesting to speak with Dr. Elmarie Shiley nurse. He did not provide any additional info. He said, call him at [phone]

## 2021-12-12 NOTE — Telephone Encounter (Signed)
Patient called again requesting to speak to nurse. Advised him nurse will give him a call back.  Patient can be reached at [phone].

## 2021-12-12 NOTE — Telephone Encounter (Signed)
Returned call to patient who states that he missed a call from our office this am and they didn't leave a message. Looking into chart it appears that pt was scheduled for CT to be done here in office in July and it's now been cancelled due to [place] leaving as of June 30,2023. Pt states he has transportation issues and "cannot be driving everywhere." Very eager to find out where scan will be. Will route to [facility] to help get pt's scan set up for him.

## 2021-12-12 NOTE — Telephone Encounter (Signed)
  Pt is requesting to speak with Dr. Elmarie Shiley nurse. He did not provide any additional info. He said, call him at [phone]

## 2021-12-16 ENCOUNTER — Telehealth: Payer: Self-pay | Admitting: Cardiovascular Disease

## 2021-12-16 NOTE — Telephone Encounter (Signed)
Patient was returning call to reschedule his CT. Please call the patient at [phone]

## 2022-01-21 DIAGNOSIS — F200 Paranoid schizophrenia: Secondary | ICD-10-CM | POA: Diagnosis not present

## 2022-01-22 ENCOUNTER — Other Ambulatory Visit: Payer: Medicare HMO

## 2022-01-27 ENCOUNTER — Inpatient Hospital Stay: Admission: RE | Admit: 2022-01-27 | Payer: Medicare HMO | Source: Ambulatory Visit

## 2022-02-17 ENCOUNTER — Ambulatory Visit
Admission: RE | Admit: 2022-02-17 | Discharge: 2022-02-17 | Disposition: A | Discharge status: Home / Self Care | Payer: Medicare HMO | Source: Ambulatory Visit | Attending: Cardiovascular Disease | Admitting: Cardiovascular Disease

## 2022-02-17 ENCOUNTER — Telehealth: Payer: Self-pay | Admitting: Cardiovascular Disease

## 2022-02-17 DIAGNOSIS — K449 Diaphragmatic hernia without obstruction or gangrene: Secondary | ICD-10-CM | POA: Diagnosis not present

## 2022-02-17 DIAGNOSIS — R918 Other nonspecific abnormal finding of lung field: Secondary | ICD-10-CM | POA: Diagnosis not present

## 2022-02-17 DIAGNOSIS — I712 Thoracic aortic aneurysm, without rupture, unspecified: Secondary | ICD-10-CM | POA: Diagnosis not present

## 2022-02-17 DIAGNOSIS — M47814 Spondylosis without myelopathy or radiculopathy, thoracic region: Secondary | ICD-10-CM | POA: Diagnosis not present

## 2022-02-17 DIAGNOSIS — I7121 Aneurysm of the ascending aorta, without rupture: Secondary | ICD-10-CM

## 2022-02-17 MED ORDER — IOPAMIDOL (ISOVUE-370) INJECTION 76%
75.0000 mL | Freq: Once | INTRAVENOUS | Status: AC | PRN
  Administered 2022-02-17: 75 mL via INTRAVENOUS

## 2022-02-17 NOTE — Telephone Encounter (Signed)
Patient is requesting to discuss CT results.

## 2022-02-19 ENCOUNTER — Telehealth: Payer: Self-pay

## 2022-02-19 DIAGNOSIS — I7121 Aneurysm of the ascending aorta, without rupture: Secondary | ICD-10-CM

## 2022-02-19 NOTE — Telephone Encounter (Signed)
-----   Message from Thayer Headings, MD sent at 02/17/2022  8:12 PM EDT ----- Mildly dilated thoracic aorta . 4.1 cm. This is unchanged from previous EC  2 CM perihilar ground glass opacity which is new Radiology recommends repeat study in a year to look at these opacities May be a small area of inflammation ( pneumonitis) or atelectasis ( small areas of collapsed lung that can improve with deep breathing)   Please send a copy of this to his primary MD  Repeat the CTA of the aorta in 1 year

## 2022-02-19 NOTE — Telephone Encounter (Signed)
Patient calling back for CT results.

## 2022-02-19 NOTE — Telephone Encounter (Signed)
Spoke at length with patient about results of the CTA of Aorta. He understands to ask PCP about further imaging regarding opacities. Copy has been sent to PCP. States he has an appt there next week. Repeat CTA of aorta has been ordered to be done in 1 year to follow his aneurysm.

## 2022-02-19 NOTE — Telephone Encounter (Signed)
See result note from today 02/19/22

## 2022-02-24 DIAGNOSIS — J439 Emphysema, unspecified: Secondary | ICD-10-CM | POA: Diagnosis not present

## 2022-02-24 DIAGNOSIS — E785 Hyperlipidemia, unspecified: Secondary | ICD-10-CM | POA: Diagnosis not present

## 2022-02-24 DIAGNOSIS — Z125 Encounter for screening for malignant neoplasm of prostate: Secondary | ICD-10-CM | POA: Diagnosis not present

## 2022-02-24 DIAGNOSIS — E039 Hypothyroidism, unspecified: Secondary | ICD-10-CM | POA: Diagnosis not present

## 2022-02-24 DIAGNOSIS — F209 Schizophrenia, unspecified: Secondary | ICD-10-CM | POA: Diagnosis not present

## 2022-02-24 DIAGNOSIS — F319 Bipolar disorder, unspecified: Secondary | ICD-10-CM | POA: Diagnosis not present

## 2022-02-24 DIAGNOSIS — Z0000 Encounter for general adult medical examination without abnormal findings: Secondary | ICD-10-CM | POA: Diagnosis not present

## 2022-02-24 DIAGNOSIS — I719 Aortic aneurysm of unspecified site, without rupture: Secondary | ICD-10-CM | POA: Diagnosis not present

## 2022-03-24 ENCOUNTER — Other Ambulatory Visit: Payer: Self-pay | Admitting: Cardiovascular Disease

## 2022-03-28 ENCOUNTER — Other Ambulatory Visit: Payer: Self-pay | Admitting: Cardiovascular Disease

## 2022-04-21 DIAGNOSIS — E039 Hypothyroidism, unspecified: Secondary | ICD-10-CM | POA: Diagnosis not present

## 2022-04-21 DIAGNOSIS — E785 Hyperlipidemia, unspecified: Secondary | ICD-10-CM | POA: Diagnosis not present

## 2022-04-23 DIAGNOSIS — R229 Localized swelling, mass and lump, unspecified: Secondary | ICD-10-CM | POA: Diagnosis not present

## 2022-04-23 DIAGNOSIS — M799 Soft tissue disorder, unspecified: Secondary | ICD-10-CM | POA: Diagnosis not present

## 2022-04-24 ENCOUNTER — Other Ambulatory Visit: Payer: Self-pay | Admitting: Family Medicine

## 2022-04-24 DIAGNOSIS — R229 Localized swelling, mass and lump, unspecified: Secondary | ICD-10-CM

## 2022-04-28 ENCOUNTER — Ambulatory Visit
Admission: RE | Admit: 2022-04-28 | Discharge: 2022-04-28 | Disposition: A | Discharge status: Home / Self Care | Payer: Medicare HMO | Source: Ambulatory Visit | Attending: Family Medicine | Admitting: Family Medicine

## 2022-04-28 DIAGNOSIS — R229 Localized swelling, mass and lump, unspecified: Secondary | ICD-10-CM

## 2022-04-28 DIAGNOSIS — Z0389 Encounter for observation for other suspected diseases and conditions ruled out: Secondary | ICD-10-CM | POA: Diagnosis not present

## 2022-05-12 DIAGNOSIS — R0781 Pleurodynia: Secondary | ICD-10-CM | POA: Diagnosis not present

## 2022-05-21 DIAGNOSIS — J069 Acute upper respiratory infection, unspecified: Secondary | ICD-10-CM | POA: Diagnosis not present

## 2022-05-21 DIAGNOSIS — E039 Hypothyroidism, unspecified: Secondary | ICD-10-CM | POA: Diagnosis not present

## 2022-07-22 DIAGNOSIS — H524 Presbyopia: Secondary | ICD-10-CM | POA: Diagnosis not present

## 2022-07-30 DIAGNOSIS — M79671 Pain in right foot: Secondary | ICD-10-CM | POA: Diagnosis not present

## 2022-07-30 DIAGNOSIS — M79604 Pain in right leg: Secondary | ICD-10-CM | POA: Diagnosis not present

## 2022-07-30 DIAGNOSIS — F319 Bipolar disorder, unspecified: Secondary | ICD-10-CM | POA: Diagnosis not present

## 2022-07-30 DIAGNOSIS — K5190 Ulcerative colitis, unspecified, without complications: Secondary | ICD-10-CM | POA: Diagnosis not present

## 2022-08-26 DIAGNOSIS — F209 Schizophrenia, unspecified: Secondary | ICD-10-CM | POA: Diagnosis not present

## 2022-08-26 DIAGNOSIS — M79606 Pain in leg, unspecified: Secondary | ICD-10-CM | POA: Diagnosis not present

## 2022-08-26 DIAGNOSIS — M79671 Pain in right foot: Secondary | ICD-10-CM | POA: Diagnosis not present

## 2022-09-16 DIAGNOSIS — Z03818 Encounter for observation for suspected exposure to other biological agents ruled out: Secondary | ICD-10-CM | POA: Diagnosis not present

## 2022-09-16 DIAGNOSIS — R051 Acute cough: Secondary | ICD-10-CM | POA: Diagnosis not present

## 2022-09-16 DIAGNOSIS — J069 Acute upper respiratory infection, unspecified: Secondary | ICD-10-CM | POA: Diagnosis not present

## 2022-09-18 DIAGNOSIS — F200 Paranoid schizophrenia: Secondary | ICD-10-CM | POA: Diagnosis not present

## 2022-10-28 ENCOUNTER — Other Ambulatory Visit: Payer: Self-pay | Admitting: Family Medicine

## 2022-10-28 ENCOUNTER — Ambulatory Visit
Admission: RE | Admit: 2022-10-28 | Discharge: 2022-10-28 | Disposition: A | Discharge status: Home / Self Care | Payer: Medicare HMO | Source: Ambulatory Visit | Attending: Family Medicine | Admitting: Family Medicine

## 2022-10-28 DIAGNOSIS — E785 Hyperlipidemia, unspecified: Secondary | ICD-10-CM | POA: Diagnosis not present

## 2022-10-28 DIAGNOSIS — E039 Hypothyroidism, unspecified: Secondary | ICD-10-CM | POA: Diagnosis not present

## 2022-10-28 DIAGNOSIS — I7121 Aneurysm of the ascending aorta, without rupture: Secondary | ICD-10-CM | POA: Diagnosis not present

## 2022-10-28 DIAGNOSIS — M79671 Pain in right foot: Secondary | ICD-10-CM

## 2022-10-28 DIAGNOSIS — M7731 Calcaneal spur, right foot: Secondary | ICD-10-CM | POA: Diagnosis not present

## 2022-10-28 DIAGNOSIS — F209 Schizophrenia, unspecified: Secondary | ICD-10-CM | POA: Diagnosis not present

## 2022-11-17 ENCOUNTER — Ambulatory Visit: Payer: Medicare HMO | Admitting: Podiatry

## 2022-11-17 ENCOUNTER — Ambulatory Visit: Payer: Medicare HMO

## 2022-11-17 DIAGNOSIS — F319 Bipolar disorder, unspecified: Secondary | ICD-10-CM | POA: Diagnosis not present

## 2022-11-17 DIAGNOSIS — M7731 Calcaneal spur, right foot: Secondary | ICD-10-CM | POA: Diagnosis not present

## 2022-11-17 DIAGNOSIS — K5190 Ulcerative colitis, unspecified, without complications: Secondary | ICD-10-CM | POA: Diagnosis not present

## 2022-11-17 DIAGNOSIS — M7750 Other enthesopathy of unspecified foot: Secondary | ICD-10-CM

## 2022-11-17 DIAGNOSIS — M722 Plantar fascial fibromatosis: Secondary | ICD-10-CM | POA: Diagnosis not present

## 2022-11-17 MED ORDER — TRIAMCINOLONE ACETONIDE 10 MG/ML IJ SUSP
10.0000 mg | Freq: Once | INTRAMUSCULAR | Status: AC
  Administered 2022-11-17: 10 mg

## 2022-11-17 NOTE — Progress Notes (Signed)
Subjective:   Patient ID: Andrew Jennings, male   DOB: 62 y.o.   MRN: [id number]   HPI Chief Complaint  Patient presents with   Foot Pain    NP  right heel pain    62 year old male presents the office today with the above concerns.  This is an ongoing for about 3 months, Tylenol does not help. No injuries. Pain is worse in the AM to go to the bathroom, little better as day goes on.  No radiating pain.  No other concerns.    Review of Systems  All other systems reviewed and are negative.  Past Medical History:  Diagnosis Date   AAA (abdominal aortic aneurysm) ([facility]) last 18 yrs    4.1 cm per chest ct 03-25-17 epic, followed by dr Melburn Popper cardiology lov 04-30-16    Aneurysm, aorta, thoracic ([facility]) 01/20/2014   Arthritis    Bipolar disorder ([facility]) 12/19/2010   Bulging lumbar disc    Complication of anesthesia    "stopped breathing with old anesthesia  with colonscopy at dr perry's office 4 yrs ago, did fine with new anesthesia with colonscopy this year at dr perry's officeper pt"   Depression    GERD (gastroesophageal reflux disease)    Glaucoma    Hemorrhoids    History of adenomatous polyp of colon    History of exercise stress test 06-25-2017    dr Carney Harder   normal stress test without any evidence of ischemia, his chest pain atypical, his ascending aortis aneurism is small and has been stable by seial CT angiograms,  he is at low risk    History of head and neck radiation    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 ,  completed 09-04-2008   History of laryngeal cancer    05-27-2013 per last oncologist note by dr Bertis Ruddy ([facility])  2010 -- 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, treated w/ external radiation (completed 09-04-2008)--- cancer is cured, released from cancer center pt to follow up with ENT   Hyperlipidemia    Hypothyroidism    secondary radiation therpy induced in 02/ 2010   Laryngeal cancer ([facility]) 12/19/2010   OSA  (obstructive sleep apnea)    does not use macine cannot afford it   OSA on CPAP 12/25/2011   Schizophrenia ([facility])    Scoliosis    Ulcerative colitis 12/19/2010    Past Surgical History:  Procedure Laterality Date   CARDIOVASCULAR STRESS TEST  03/03/2013   normal nuclear study (lexiscan no exercise) w/ no evidence ishcemia/  normal LV function and wall motion, 41%   COLONOSCOPY  last one 01-22-2017  dr Marina Goodell   CYSTOSCOPY/RETROGRADE/URETEROSCOPY Right 07/23/2017   Procedure: CYSTOSCOPY RIGHT RETROGRADE;  Surgeon: Bjorn Pippin, MD;  Location: [facility];  Service: Urology;  Laterality: Right;   NECK SURGERY  yrs ago   BLOOD CLOT IN NECK   TRANSTHORACIC ECHOCARDIOGRAM  12/18/2011   mild concentric LVH, ef >55%/ mild AR and MR/ trace TR/ mild PR/ mild aortic root dilatation   UMBILICAL HERNIA REPAIR  as child     Current Outpatient Medications:    acetaminophen (TYLENOL) 500 MG tablet, Take 1,000 mg by mouth every 6 (six) hours as needed., Disp: , Rfl:    aspirin 81 MG tablet, Take 81 mg by mouth daily., Disp: , Rfl:    diclofenac (VOLTAREN) 75 MG EC tablet, Take 2 tablets by mouth daily., Disp: , Rfl:    diphenhydrAMINE (BENADRYL)  50 MG capsule, Take 300 mg by mouth at bedtime., Disp: , Rfl:    fenofibrate (TRICOR) 145 MG tablet, TAKE 1 TABLET EVERY DAY, Disp: 90 tablet, Rfl: 2   haloperidol (HALDOL) 10 MG tablet, Take 10 mg by mouth 2 (two) times daily., Disp: , Rfl:    haloperidol (HALDOL) 5 MG tablet, Take 5 mg by mouth at bedtime., Disp: , Rfl:    levothyroxine (SYNTHROID) 150 MCG tablet, Take 1 tablet by mouth daily., Disp: , Rfl:    rosuvastatin (CRESTOR) 10 MG tablet, TAKE 1 TABLET EVERY DAY, Disp: 90 tablet, Rfl: 2   sildenafil (REVATIO) 20 MG tablet, TAKE ONE TO FIVE TABLETS BY MOUTH DAILY AS NEEDED, Disp: 20 tablet, Rfl: 0  Allergies  Allergen Reactions   Darvocet [Propoxyphene N-Acetaminophen] Other (See Comments)    "Makes me feel strange"..Per pt     Penicillins Other (See Comments)    Makes sick, per pt  Makes sick, per pt   Wellbutrin [Bupropion]     Makes me feel sick           Objective:  Physical Exam  General: AAO x3, NAD  Dermatological: Skin is warm, dry and supple bilateral. Nails x 10 are well manicured; remaining integument appears unremarkable at this time. There are no open sores, no preulcerative lesions, no rash or signs of infection present.  Vascular: Dorsalis Pedis artery and Posterior Tibial artery pedal pulses are 2/4 bilateral with immedate capillary fill time.  There is no pain with calf compression, swelling, warmth, erythema.   Neruologic: Grossly intact via light touch bilateral.  Negative Tinel sign..   Musculoskeletal: Tenderness to palpation along the plantar medial tubercle of the calcaneus at the insertion of plantar fascia on the right foot. There is no pain along the course of the plantar fascia within the arch of the foot. Plantar fascia appears to be intact. There is no pain with lateral compression of the calcaneus or pain with vibratory sensation. There is no pain along the course or insertion of the achilles tendon. No other areas of tenderness to bilateral lower extremities. MMT 5/5.   Gait: Unassisted, Nonantalgic.       Assessment:   Right heel pain, Planter fasciitis     Plan:  -Treatment options discussed including all alternatives, risks, and complications -Etiology of symptoms were discussed -Previous x-rays reviewed.  No evidence of acute fracture.  Calcaneal spurring is present. -Steroid injection performed.  See procedure note below. -Night splint dispensed. Static or dynamic ankle foot orthosis, including soft interface material, adjustable for fit, for positioning, may be used for minimal ambulation, prefabricated, off-the-shelf was dispensed on the billed date of service  -Stretching, icing on a regular basis.  Discussed using good arch support.  Procedure: Injection  Tendon/Ligament Discussed alternatives, risks, complications and verbal consent was obtained.  Location: Right plantar fascia at the glabrous junction; medial approach. Skin Prep: Alcohol. Injectate: 0.5cc 0.5% marcaine plain, 0.5 cc 2% lidocaine plain and, 1 cc kenalog 10. Disposition: Patient tolerated procedure well. Injection site dressed with a band-aid.  Post-injection care was discussed and return precautions discussed.   Vivi Barrack DPM

## 2022-11-17 NOTE — Patient Instructions (Signed)

## 2022-11-18 ENCOUNTER — Telehealth: Payer: Self-pay | Admitting: Podiatry

## 2022-11-18 NOTE — Telephone Encounter (Signed)
Pt called and left message 5.6 @ 533pm stating he would like Dr Ardelle Anton to tell his pcp Dr Farris Has what is pts problem.  I called pt to let him know that Dr Ardelle Anton with automatically send the note to Dr Marrow thru my chart. Pt said thank you for calling.

## 2022-11-18 NOTE — Progress Notes (Unsigned)
Andrew Jennings Date of Birth  03/04/1961       [facility]    [place] [address], [address]  [address] [place], [place]  [address]   [place], [place]  [address] [phone]     [phone]   Fax  [phone]     Fax [phone]  Problem List: 1. Thoracic aortic aneurism - small  2. Hyperlipidemia 3. Laryngeal cancer ~2010, XRT 4. Bipolar disorder       Andrew Jennings is a 62 year old gentleman who presents today for followup of his thoracic aortic aneurysm. He was originally told in [place] that he had a thoracic aneurysm. He was previously followed by Dr. Alanda Amass.    Occasional CP - seems musculoskelatal.  Walks 20-30 minutes a day.   Used to smoke - stopped when he developed larangyeal cancer  January 22, 2015:  Andrew Jennings is seen in follow up for his ascending aortic aneurism. The MRI from 2015 reveals:  IMPRESSION: Maximal diameter of the ascending aorta above the valve has increased from 3.8 cm to 4.2 cm. At the sino-tubular junction, it has increased from 3.8 cm to 4.1 cm. It is grossly unchanged at the sinus of Valsalva.  He did not like the MRA because he was not able to hold his breath that long .  He is doing well.   Oct. 18, 2017:  Andrew Jennings is seen today for follow-up of his thoracic aortic aneurysm. He had an MRI scan in 2015. He does not want have an MRI a again because it requires him to hold his breath for   He denies any chest pain or shortness of breath. His blood pressure and heart rate are normal.  Sept. 5, 2018:  Andrew Jennings is seen for follow up .   Has a small thoracic arortic aneurism  Is scheduled for a CT scan soon .  The last imaging of his aorta was 2015.   Sept 10, 2019: Andrew Jennings is seen today for follow-up of his thoracic aortic aneurysm and hyperlipidemia. CT angiogram performed March 25, 2017 reveals a small a sending aortic aneurysm measuring 4.1 mm in diameter.  It was unchanged compared to the July 2015  CT scan.  He was also found to have mild emphysema.  He has a history of hyperlipidemia.  He had a negative treadmill test in December, 2018 as part of a preoperative evaluation prior to his cystoscopy.  Cystoscopy in January was clear.  He does not have any significant bladder abnormalities.  October 21, 2019:  Andrew Jennings is seen today for follow-up of his hyperlipidemia and mild ascending aortic aneurysm. He is doing well.  Is not having any episodes of chest pain.  He takes his medicine regularly.  He works out with [employer].  He is concerned about elevated CPK levels but it sounds like he is not drinking enough water.  I encouraged him to drink more water to minimize muscle soreness and CPK elevations.   October 29, 2020: Andrew Jennings is seen today for follow up of his HLD, mild ascending aortic aneurism Repeat CTA of aorta  Trying to do some exercise .  Lifting some weights    Nov 15, 2021 Andrew Jennings is seen for follow up of his ascending aortic aneurism and HLD  He is due to repeat his CTA of the aorta to follow up with his asc. Aortic aneurism    Nov 19, 2022 Andrew Jennings is seen for follow up of his ascending aortic aneurism  and HLD Ascending aorta measures 4.1 cm  Sinus of valsalva measures 4.6 cm    Current Outpatient Medications on File Prior to Visit  Medication Sig Dispense Refill   acetaminophen (TYLENOL) 500 MG tablet Take 1,000 mg by mouth every 6 (six) hours as needed.     aspirin 81 MG tablet Take 81 mg by mouth daily.     diclofenac (VOLTAREN) 75 MG EC tablet Take 2 tablets by mouth daily. (Patient not taking: Reported on 11/15/2021)     diphenhydrAMINE (BENADRYL) 50 MG capsule Take 300 mg by mouth at bedtime.     fenofibrate (TRICOR) 145 MG tablet TAKE 1 TABLET EVERY DAY 90 tablet 2   haloperidol (HALDOL) 10 MG tablet Take 10 mg by mouth 2 (two) times daily.     haloperidol (HALDOL) 5 MG tablet Take 5 mg by mouth at bedtime.     levothyroxine (SYNTHROID) 150 MCG tablet Take 1 tablet by mouth  daily.     rosuvastatin (CRESTOR) 10 MG tablet TAKE 1 TABLET EVERY DAY 90 tablet 2   sildenafil (REVATIO) 20 MG tablet TAKE ONE TO FIVE TABLETS BY MOUTH DAILY AS NEEDED 20 tablet 0   No current facility-administered medications on file prior to visit.    Allergies  Allergen Reactions   Darvocet [Propoxyphene N-Acetaminophen] Other (See Comments)    "Makes me feel strange"..Per pt    Penicillins Other (See Comments)    Makes sick, per pt  Makes sick, per pt   Wellbutrin [Bupropion]     Makes me feel sick    Past Medical History:  Diagnosis Date   AAA (abdominal aortic aneurysm) (HCC) last 18 yrs    4.1 cm per chest ct 03-25-17 epic, followed by dr Melburn Popper cardiology lov 04-30-16    Aneurysm, aorta, thoracic (HCC) 01/20/2014   Arthritis    Bipolar disorder (HCC) 12/19/2010   Bulging lumbar disc    Complication of anesthesia    "stopped breathing with old anesthesia  with colonscopy at dr perry's office 4 yrs ago, did fine with new anesthesia with colonscopy this year at dr perry's officeper pt"   Depression    GERD (gastroesophageal reflux disease)    Glaucoma    Hemorrhoids    History of adenomatous polyp of colon    History of exercise stress test 06-25-2017    dr Carney Harder   normal stress test without any evidence of ischemia, his chest pain atypical, his ascending aortis aneurism is small and has been stable by seial CT angiograms,  he is at low risk    History of head and neck radiation    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 ,  completed 09-04-2008   History of laryngeal cancer    05-27-2013 per last oncologist note by dr Bertis Ruddy ([facility])  2010 -- 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, treated w/ external radiation (completed 09-04-2008)--- cancer is cured, released from cancer center pt to follow up with ENT   Hyperlipidemia    Hypothyroidism    secondary radiation therpy induced in 02/ 2010   Laryngeal cancer  (HCC) 12/19/2010   OSA (obstructive sleep apnea)    does not use macine cannot afford it   OSA on CPAP 12/25/2011   Schizophrenia (HCC)    Scoliosis    Ulcerative colitis 12/19/2010    Past Surgical History:  Procedure Laterality Date   CARDIOVASCULAR STRESS TEST  03/03/2013   normal nuclear study (lexiscan no exercise)  w/ no evidence ishcemia/  normal LV function and wall motion, 41%   COLONOSCOPY  last one 01-22-2017  dr Marina Goodell   CYSTOSCOPY/RETROGRADE/URETEROSCOPY Right 07/23/2017   Procedure: CYSTOSCOPY RIGHT RETROGRADE;  Surgeon: Bjorn Pippin, MD;  Location: [facility];  Service: Urology;  Laterality: Right;   NECK SURGERY  yrs ago   BLOOD CLOT IN NECK   TRANSTHORACIC ECHOCARDIOGRAM  12/18/2011   mild concentric LVH, ef >55%/ mild AR and MR/ trace TR/ mild PR/ mild aortic root dilatation   UMBILICAL HERNIA REPAIR  as child    Social History   Tobacco Use  Smoking Status Former   Types: Cigarettes   Quit date: 05/02/2009   Years since quitting: 13.5  Smokeless Tobacco Never    Social History   Substance and Sexual Activity  Alcohol Use Yes   Comment: occasional    Family History  Problem Relation Age of Onset   Arthritis Mother    Hyperlipidemia Mother    Heart disease Mother    Arthritis Father    Colon cancer Neg Hx     Reviw of Systems:  Reviewed in the HPI.  All other systems are negative.  Physical Exam: There were no vitals taken for this visit.  No BP recorded.  {Refresh Note OR Click here to enter BP  :1}***    GEN:  Well nourished, well developed in no acute distress HEENT: Normal NECK: No JVD; No carotid bruits LYMPHATICS: No lymphadenopathy CARDIAC: RRR ***, no murmurs, rubs, gallops RESPIRATORY:  Clear to auscultation without rales, wheezing or rhonchi  ABDOMEN: Soft, non-tender, non-distended MUSCULOSKELETAL:  No edema; No deformity  SKIN: Warm and dry NEUROLOGIC:  Alert and oriented x 3    ECG: Nov 15, 2021:    Assessment / Plan:   1. Thoracic aortic aneurism - his ascending aorta is 4.7 cm.    2. Hyperlipidemia -       3. Laryngeal cancer    4. Bipolar disorder / Autism :  Stable    Kristeen Miss, MD  11/18/2022 8:51 PM    [facility] [address] [place],  [address] [place], [place]  [address] Pager [phone] Phone: [phone]; Fax: [phone]

## 2022-11-19 ENCOUNTER — Encounter: Payer: Self-pay | Admitting: Cardiovascular Disease

## 2022-11-19 ENCOUNTER — Ambulatory Visit: Payer: Medicare HMO | Attending: Cardiovascular Disease | Admitting: Cardiovascular Disease

## 2022-11-19 VITALS — BP 104/60 | HR 73 | Ht 67.0 in | Wt 164.0 lb

## 2022-11-19 DIAGNOSIS — I7121 Aneurysm of the ascending aorta, without rupture: Secondary | ICD-10-CM

## 2022-11-19 DIAGNOSIS — E782 Mixed hyperlipidemia: Secondary | ICD-10-CM | POA: Diagnosis not present

## 2022-11-19 NOTE — Patient Instructions (Signed)
Medication Instructions:  Your physician recommends that you continue on your current medications as directed. Please refer to the Current Medication list given to you today.  *If you need a refill on your cardiac medications before your next appointment, please call your pharmacy*   Lab Work: NONE If you have labs (blood work) drawn today and your tests are completely normal, you will receive your results only by: MyChart Message (if you have MyChart) OR A paper copy in the mail If you have any lab test that is abnormal or we need to change your treatment, we will call you to review the results.   Testing/Procedures: NONE   Follow-Up: At [facility], you and your health needs are our priority.  As part of our continuing mission to provide you with exceptional heart care, we have created designated Provider Care Teams.  These Care Teams include your primary Cardiologist (physician) and Advanced Practice Providers (APPs -  Physician Assistants and Nurse Practitioners) who all work together to provide you with the care you need, when you need it.  We recommend signing up for the patient portal called "MyChart".  Sign up information is provided on this After Visit Summary.  MyChart is used to connect with patients for Virtual Visits (Telemedicine).  Patients are able to view lab/test results, encounter notes, upcoming appointments, etc.  Non-urgent messages can be sent to your provider as well.   To learn more about what you can do with MyChart, go to [email or web address].    Your next appointment:   1 year(s)  Provider:   Philip Nahser, MD      

## 2022-11-20 ENCOUNTER — Encounter: Payer: Self-pay | Admitting: Cardiovascular Disease

## 2022-12-18 DIAGNOSIS — E039 Hypothyroidism, unspecified: Secondary | ICD-10-CM | POA: Diagnosis not present

## 2022-12-18 DIAGNOSIS — E785 Hyperlipidemia, unspecified: Secondary | ICD-10-CM | POA: Diagnosis not present

## 2023-01-02 ENCOUNTER — Other Ambulatory Visit: Payer: Self-pay | Admitting: Cardiovascular Disease

## 2023-01-20 ENCOUNTER — Ambulatory Visit (HOSPITAL_BASED_OUTPATIENT_CLINIC_OR_DEPARTMENT_OTHER)
Admission: RE | Admit: 2023-01-20 | Discharge: 2023-01-20 | Disposition: A | Discharge status: Home / Self Care | Payer: Medicare HMO | Source: Ambulatory Visit | Attending: Cardiovascular Disease | Admitting: Cardiovascular Disease

## 2023-01-20 DIAGNOSIS — I719 Aortic aneurysm of unspecified site, without rupture: Secondary | ICD-10-CM | POA: Diagnosis not present

## 2023-01-20 DIAGNOSIS — I7121 Aneurysm of the ascending aorta, without rupture: Secondary | ICD-10-CM | POA: Diagnosis not present

## 2023-01-20 DIAGNOSIS — C329 Malignant neoplasm of larynx, unspecified: Secondary | ICD-10-CM | POA: Diagnosis not present

## 2023-01-20 DIAGNOSIS — R911 Solitary pulmonary nodule: Secondary | ICD-10-CM | POA: Diagnosis not present

## 2023-01-20 DIAGNOSIS — R918 Other nonspecific abnormal finding of lung field: Secondary | ICD-10-CM | POA: Diagnosis not present

## 2023-01-20 MED ORDER — IOHEXOL 350 MG/ML SOLN
100.0000 mL | Freq: Once | INTRAVENOUS | Status: AC | PRN
  Administered 2023-01-20: 75 mL via INTRAVENOUS

## 2023-01-26 ENCOUNTER — Telehealth: Payer: Self-pay

## 2023-01-26 ENCOUNTER — Telehealth: Payer: Self-pay | Admitting: Cardiovascular Disease

## 2023-01-26 DIAGNOSIS — I7121 Aneurysm of the ascending aorta, without rupture: Secondary | ICD-10-CM

## 2023-01-26 NOTE — Telephone Encounter (Signed)
-----   Message from Kristeen Miss sent at 01/25/2023 12:44 PM EDT ----- Mild dilatation of the aorta at the sinuses of valsalva at 4.9 cm Slightly larger than last year when it was 4.6 cm  "Ground glass opacities " in the right upper lobe.  Likely inflammatory   Repeat aortic CTA in 6 months . Refer to [facility] to follow this aortic aneurism

## 2023-01-26 NOTE — Telephone Encounter (Signed)
Pt was returning Andrew Jennings's call and is requesting a callback on [phone] but said not to leave a voicemail due to him not know how to check it. Please advise

## 2023-01-26 NOTE — Telephone Encounter (Signed)
Patient is requesting call back to get update on CT results.

## 2023-01-26 NOTE — Telephone Encounter (Signed)
Called patient and left detailed message on brother's voicemail per DPR. Advised him to call back if wants to discuss further or had questions. Order placed for CTA of aorta to be done in 6 months and referral placed to [facility].

## 2023-01-27 NOTE — Telephone Encounter (Signed)
Pt called in asking to speak to nurse, he has some additional questions about his diet

## 2023-01-27 NOTE — Telephone Encounter (Signed)
Pt called to ask DR Nahser if he should cut back on using the Sildenafil since he has been using it every other day for the last 2 months... he is also asking if he needs to increase or decrease his NA intake he could not remember what he had discussed with Dr Elease Hashimoto at his last OV.   He asked the next step for his aneurysm and I altered him to his OV with Dr Leafy Ro on 02/09/23 and gave him the address.   Pt asks to onnly leave a message on his home phone [phone].

## 2023-01-27 NOTE — Telephone Encounter (Signed)
Patient is calling back for update. Please advise  

## 2023-01-28 ENCOUNTER — Telehealth: Payer: Self-pay | Admitting: Cardiovascular Disease

## 2023-01-28 NOTE — Telephone Encounter (Signed)
Patient called back.  He wanted to know if stress caused his aneurysm.  Adv life stress is not likely to CAUSE the aneurysm but could make it worse if stress causes his BP to spike.  "Okay that's all I needed to know".

## 2023-01-28 NOTE — Telephone Encounter (Signed)
Patient calling to see if he can lift light weights. Please advise

## 2023-01-28 NOTE — Telephone Encounter (Signed)
Patient returned call.  Recommendations from Dr. Elease Hashimoto provided:  Nahser, Deloris Ping, MD  Lars Mage, RN18 hours ago (4:11 PM)   He should continue to limit his salt intake  I from a medical standpoint, there are no contraindications for Sildenafil every other day .  PN

## 2023-01-28 NOTE — Telephone Encounter (Signed)
 Left message for patient to call back  

## 2023-01-28 NOTE — Telephone Encounter (Signed)
Left message to call back  

## 2023-01-28 NOTE — Telephone Encounter (Signed)
Spoke with the patient who states that he would like to know if he is okay to lift weights due to his aortic aneurysm. He states that he does not lift weights overheard, only using 10lbs to do curls. Advised that we will follow up with him tomorrow with an answer. He states that if he does not answer that a voicemail can be left.

## 2023-01-28 NOTE — Telephone Encounter (Signed)
Patient is following up. He would like to know if anyone else can advise until Dr. Elease Hashimoto returns to the office.

## 2023-01-29 NOTE — Telephone Encounter (Signed)
Left patient a message explaining that yes, he can safely lift weights and that his need to see vascular is a natural progression. Did advise that he stick with low weights and high reps, rather than trying to do very heavy weights.

## 2023-01-30 ENCOUNTER — Telehealth: Payer: Self-pay | Admitting: Cardiovascular Disease

## 2023-01-30 NOTE — Telephone Encounter (Signed)
Patient would like to know if he is allowed to drink alcohol since he is taking heart medication. Please advise.

## 2023-01-30 NOTE — Telephone Encounter (Signed)
Patient called again regarding using alcohol with his heart medication.

## 2023-01-30 NOTE — Telephone Encounter (Signed)
Both haldol and alcohol are CNS depressants. Therefore its effects may be enhanced. Should use with caution and in moderation.

## 2023-02-02 NOTE — Telephone Encounter (Signed)
Left detailed message per DPR with melissa's comments. Advised he call back if needs to discuss further.

## 2023-02-02 NOTE — Telephone Encounter (Signed)
Patient is returning call.  °

## 2023-02-03 NOTE — Telephone Encounter (Signed)
Returned call to patient and explained again the alcohol use recommendations. He also questions if it's safe to eat spicy foods-advised yes, but may precipitate acid reflux. Also educated to limit high sodium foods. No further questions.

## 2023-02-08 NOTE — Progress Notes (Signed)
[address]       [place] [address]             [phone]           Leonid Baise [facility] Medical Record #[id number] Date of Birth: 1960/11/10  Nahser, Deloris Ping, MD Farris Has, MD  Chief Complaint: Aortic aneurysm    History of Present Illness:     Pt is a 62 yo with known aortic aneurysm( was told when he was in [place] in 2011) and has now moved to [place] to be caregiver to his mother. No HTN. Pt has been followed with serial CTA of chest with small ascending aortic aneurysm (4.2cm) and reportedly an enlargement in the root from 4.6cm to 4.9cm ( I personally reviewed the CTAs and feel that it has always been 4.9cm). Pt has no first degree relatives with ruptured aneurysms      Past Medical History:  Diagnosis Date   AAA (abdominal aortic aneurysm) (HCC) last 18 yrs    4.1 cm per chest ct 03-25-17 epic, followed by dr Melburn Popper cardiology lov 04-30-16    Aneurysm, aorta, thoracic (HCC) 01/20/2014   Arthritis    Bipolar disorder (HCC) 12/19/2010   Bulging lumbar disc    Complication of anesthesia    "stopped breathing with old anesthesia  with colonscopy at dr perry's office 4 yrs ago, did fine with new anesthesia with colonscopy this year at dr perry's officeper pt"   Depression    GERD (gastroesophageal reflux disease)    Glaucoma    Hemorrhoids    History of adenomatous polyp of colon    History of exercise stress test 06-25-2017    dr Carney Harder   normal stress test without any evidence of ischemia, his chest pain atypical, his ascending aortis aneurism is small and has been stable by seial CT angiograms,  he is at low risk    History of head and neck radiation    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 ,  completed 09-04-2008   History of laryngeal cancer    05-27-2013 per last oncologist note by dr Bertis Ruddy ([facility])  2010 -- 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, treated w/ external radiation  (completed 09-04-2008)--- cancer is cured, released from cancer center pt to follow up with ENT   Hyperlipidemia    Hypothyroidism    secondary radiation therpy induced in 02/ 2010   Laryngeal cancer (HCC) 12/19/2010   OSA (obstructive sleep apnea)    does not use macine cannot afford it   OSA on CPAP 12/25/2011   Schizophrenia (HCC)    Scoliosis    Ulcerative colitis 12/19/2010    Past Surgical History:  Procedure Laterality Date   CARDIOVASCULAR STRESS TEST  03/03/2013   normal nuclear study (lexiscan no exercise) w/ no evidence ishcemia/  normal LV function and wall motion, 41%   COLONOSCOPY  last one 01-22-2017  dr Marina Goodell   CYSTOSCOPY/RETROGRADE/URETEROSCOPY Right 07/23/2017   Procedure: CYSTOSCOPY RIGHT RETROGRADE;  Surgeon: Bjorn Pippin, MD;  Location: [facility];  Service: Urology;  Laterality: Right;   NECK SURGERY  yrs ago   BLOOD CLOT IN NECK   TRANSTHORACIC ECHOCARDIOGRAM  12/18/2011   mild concentric LVH, ef >55%/ mild AR and MR/ trace TR/ mild PR/ mild aortic root dilatation   UMBILICAL HERNIA REPAIR  as child    Social History   Tobacco Use  Smoking Status Former  Current packs/day: 0.00   Types: Cigarettes   Quit date: 05/02/2009   Years since quitting: 13.7  Smokeless Tobacco Never    Social History   Substance and Sexual Activity  Alcohol Use Yes   Comment: occasional    Social History   Socioeconomic History   Marital status: Single    Spouse name: Not on file   Number of children: Not on file   Years of education: Not on file   Highest education level: Not on file  Occupational History   Not on file  Tobacco Use   Smoking status: Former    Current packs/day: 0.00    Types: Cigarettes    Quit date: 05/02/2009    Years since quitting: 13.7   Smokeless tobacco: Never  Vaping Use   Vaping status: Never Used  Substance and Sexual Activity   Alcohol use: Yes    Comment: occasional   Drug use: No   Sexual activity: Not on file   Other Topics Concern   Not on file  Social History Narrative   Not on file   Social Determinants of Health   Financial Resource Strain: Not on file  Food Insecurity: Not on file  Transportation Needs: Not on file  Physical Activity: Not on file  Stress: Not on file  Social Connections: Not on file  Intimate Partner Violence: Not on file    Allergies  Allergen Reactions   Darvocet [Propoxyphene N-Acetaminophen] Other (See Comments)    "Makes me feel strange"..Per pt    Penicillins Other (See Comments)    Makes sick, per pt  Makes sick, per pt   Wellbutrin [Bupropion]     Makes me feel sick    Current Outpatient Medications  Medication Sig Dispense Refill   acetaminophen (TYLENOL) 500 MG tablet Take 1,000 mg by mouth every 6 (six) hours as needed.     aspirin 81 MG tablet Take 81 mg by mouth daily.     diclofenac (VOLTAREN) 75 MG EC tablet Take 2 tablets by mouth daily.     diphenhydrAMINE (BENADRYL) 50 MG capsule Take 300 mg by mouth at bedtime.     fenofibrate (TRICOR) 145 MG tablet TAKE 1 TABLET EVERY DAY 90 tablet 3   haloperidol (HALDOL) 10 MG tablet Take 10 mg by mouth 2 (two) times daily.     haloperidol (HALDOL) 5 MG tablet Take 5 mg by mouth at bedtime.     levothyroxine (SYNTHROID) 150 MCG tablet Take 1 tablet by mouth daily.     rosuvastatin (CRESTOR) 10 MG tablet TAKE 1 TABLET EVERY DAY 90 tablet 2   sildenafil (REVATIO) 20 MG tablet TAKE ONE TO FIVE TABLETS BY MOUTH DAILY AS NEEDED 20 tablet 0   No current facility-administered medications for this visit.     Family History  Problem Relation Age of Onset   Arthritis Mother    Hyperlipidemia Mother    Heart disease Mother    Arthritis Father    Colon cancer Neg Hx        Physical Exam: Very nervous appearing individual Lungs: clear Card: RR without murmur     Diagnostic Studies & Laboratory data: I have personally reviewed the following studies and agree with the findings     Recent  Radiology Findings:   CTA (01/2023) FINDINGS: Cardiovascular: The aorta at the sinuses of Valsalva measures 4.9 cm, previously 4.6 cm. Sinotubular junction measures 3.8 cm. Mid ascending aorta measures 4.2 cm. Normal caliber transverse and descending aorta. Mild  atherosclerosis. No dissection or acute aortic findings. Conventional branching pattern from the aortic arch. There is no central pulmonary embolus. The heart is normal in size. Stable prominence of the main pulmonary artery at 3 cm. No pericardial effusion.   Mediastinum/Nodes: No mediastinal or hilar adenopathy. No supraclavicular or axillary adenopathy. Minimal fluid in the distal esophagus. No thyroid nodule.   Lungs/Pleura: There are new areas of ill-defined ground-glass in the periphery of the right upper lobe, series 6 images 70 through 75. The area of central ground-glass opacity in the right lung on prior has resolved. The ground-glass opacity in the superior segment of the right lower lobe on prior has resolved. Perifissural nodule in the right middle lobe measuring 4 mm series 6, image 83, previously 3 mm.   No pleural effusion. Mild emphysema. Trachea and central airways are clear.   Upper Abdomen: No acute or unexpected findings. Minimal hiatal hernia.   Musculoskeletal: There are no acute or suspicious osseous abnormalities.   Review of the MIP images confirms the above findings.   IMPRESSION: 1. Mild aneurysmal dilatation of the aortic root at the sinuses of Valsalva measuring 4.9 cm, previously 4.6 cm. Mid ascending aorta measures 4.2 cm. Recommend semi-annual imaging followup by CTA or MRA and referral to cardiothoracic surgery if not already obtained. 2. Shifting ground-glass opacities in the right lung, with resolution of previous central ground-glass opacities put new ground-glass in the periphery of the right upper lobe. This is favored to be infectious or inflammatory in etiology. Consider  CT follow-up in 3-6 months to assess for resolution or change. 3. Perifissural nodule in the right middle lobe measuring 4 mm, previously 3 mm. This is likely a perifissural lymph node, however recommend attention at follow-up given history of malignancy. 4. Minimal hiatal hernia. Minimal fluid in the distal esophagus, can be seen with reflux or dysmotility. 5. Stable prominence of the main pulmonary artery at 3 cm, can be seen with pulmonary arterial hypertension.   Aortic Atherosclerosis (ICD10-I70.0) and Emphysema (ICD10-J43.9).      Recent Lab Findings: Lab Results  Component Value Date   WBC 7.5 12/03/2015   HGB 16.7 07/23/2017   HCT 49.0 07/23/2017   PLT 256.0 12/03/2015   GLUCOSE 88 10/29/2020   CHOL 230 (H) 01/23/2021   TRIG 124 01/23/2021   HDL 48 01/23/2021   LDLDIRECT 99.0 12/30/2016   LDLCALC 160 (H) 01/23/2021   ALT 24 01/23/2021   AST 30 10/21/2019   NA 144 10/29/2020   K 4.6 10/29/2020   CL 104 10/29/2020   CREATININE 0.85 10/29/2020   BUN 14 10/29/2020   CO2 23 10/29/2020   TSH 3.77 06/11/2017      Assessment / Plan:   62 yo male with unchanged aortic root aneurysm at 4.9cm. I feel since has history of aneurysm and no change over last year by my measurements that it is prefferable to continue to monitor it yearly and does not need repeat in 6 months. Would obtain echo to baseline any AI ( I hear no murmur) to also follow for indications for intervention. Pt may lift weights that do not cause him to exert himself strenuously. Pt very happy with news that can be followed yearly     I have spent 60 min in review of the records, viewing studies and in face to face with patient and in coordination of future care    Eugenio Hoes 02/08/2023 11:50 AM

## 2023-02-09 ENCOUNTER — Encounter: Payer: Self-pay | Admitting: Thoracic Surgery (Cardiothoracic Vascular Surgery)

## 2023-02-09 ENCOUNTER — Institutional Professional Consult (permissible substitution) (INDEPENDENT_AMBULATORY_CARE_PROVIDER_SITE_OTHER): Payer: Medicare HMO | Admitting: Thoracic Surgery (Cardiothoracic Vascular Surgery)

## 2023-02-09 VITALS — BP 108/66 | HR 83 | Resp 18 | Ht 67.0 in | Wt 160.0 lb

## 2023-02-09 DIAGNOSIS — I7121 Aneurysm of the ascending aorta, without rupture: Secondary | ICD-10-CM

## 2023-02-09 NOTE — Patient Instructions (Signed)
Yearly CTA

## 2023-02-10 ENCOUNTER — Telehealth: Payer: Self-pay | Admitting: Cardiovascular Disease

## 2023-02-10 NOTE — Telephone Encounter (Signed)
Pt would like to know what kinds of food he can eat. Please advise.

## 2023-02-10 NOTE — Telephone Encounter (Signed)
Printed Mediterranean diet information and healthy lifestyle diet tips (michelle swinyer's dot phrase) mailed to patient. Called and left pt a message letting him know.

## 2023-02-11 ENCOUNTER — Telehealth: Payer: Self-pay | Admitting: Cardiovascular Disease

## 2023-02-11 NOTE — Telephone Encounter (Signed)
Called to say his dr wanted him to come back in year to have his CT done. Calling to let Dr. Elease Hashimoto know. Please advise

## 2023-02-18 ENCOUNTER — Telehealth: Payer: Self-pay | Admitting: Cardiovascular Disease

## 2023-02-18 NOTE — Telephone Encounter (Signed)
Patient is returning call. He requests another call around 11:00 AM tomorrow morning if possible.

## 2023-02-18 NOTE — Telephone Encounter (Signed)
Patient would like a call back to discuss diet restrictions.

## 2023-02-18 NOTE — Telephone Encounter (Signed)
Attempted phone call to pt and left voicemail message to contact RN at [phone]. 

## 2023-02-19 ENCOUNTER — Telehealth: Payer: Self-pay | Admitting: Cardiovascular Disease

## 2023-02-19 DIAGNOSIS — E039 Hypothyroidism, unspecified: Secondary | ICD-10-CM | POA: Diagnosis not present

## 2023-02-19 NOTE — Telephone Encounter (Signed)
Pt would like to know when he can have the CT test done again. Please advise

## 2023-02-19 NOTE — Telephone Encounter (Signed)
Left message to call back  

## 2023-02-19 NOTE — Telephone Encounter (Signed)
Patient returned RN's call. 

## 2023-02-20 ENCOUNTER — Telehealth: Payer: Self-pay | Admitting: Cardiovascular Disease

## 2023-02-20 NOTE — Telephone Encounter (Signed)
Patient is returning phone call.  °

## 2023-02-20 NOTE — Telephone Encounter (Signed)
Left detailed message on answering machine for patient.

## 2023-02-20 NOTE — Telephone Encounter (Signed)
Spoke with patient-additional diet questions answered.

## 2023-02-20 NOTE — Telephone Encounter (Signed)
Patient is returning call. Transferred to Maralyn Sago, Charity fundraiser.

## 2023-02-20 NOTE — Telephone Encounter (Signed)
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  Likely inflammatory   Repeat aortic CTA in 6 months . 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

## 2023-02-25 NOTE — Telephone Encounter (Signed)
Returned call to patient, he asked if a special diet can help make his aneurysm go away. Informed patient that once you have an aneurysm they do not go away. Diet can help manage HTN that would help prevent worsening of existing aneurysm.  Patient states he would like to discuss this with Maralyn Sago tomorrow around 11am. He states he will be waiting for her call around this time and to call him on his house phone: [phone].

## 2023-02-25 NOTE — Telephone Encounter (Signed)
Pt is calling back to speak about his new diet.

## 2023-02-26 ENCOUNTER — Telehealth: Payer: Self-pay | Admitting: Cardiovascular Disease

## 2023-02-26 NOTE — Telephone Encounter (Signed)
Completed in separate encounter

## 2023-02-26 NOTE — Telephone Encounter (Signed)
Patient called wanting to speak to nurse Maralyn Sago. He wants to know if he on a special diet will his aneurysm go away.

## 2023-02-26 NOTE — Telephone Encounter (Signed)
Returned call to patient to inform that diet won't affect aneurysm, but blood pressure control is key. States he understands.

## 2023-03-02 ENCOUNTER — Telehealth: Payer: Self-pay | Admitting: Cardiovascular Disease

## 2023-03-02 DIAGNOSIS — E782 Mixed hyperlipidemia: Secondary | ICD-10-CM

## 2023-03-02 DIAGNOSIS — Z79899 Other long term (current) drug therapy: Secondary | ICD-10-CM

## 2023-03-02 NOTE — Telephone Encounter (Signed)
Pt called in asking if he can take aspirin 81 mg in the morning and a night. He also asked if he can take an extra crestor. Please advise.

## 2023-03-02 NOTE — Telephone Encounter (Signed)
Returned call to patient and advised that he only needs to take one baby aspirin (81 gm daily) and that two will not help his aneurysm any more. As for the increase in crestor, we would need copies of recent labs. States his PCP just did blood work on him last week. Will get copies of that and then can determine if dose should be increased. Last we have on file via [facility] system is LDL=92 on 04/21/22.

## 2023-03-03 ENCOUNTER — Telehealth: Payer: Self-pay | Admitting: Cardiovascular Disease

## 2023-03-03 MED ORDER — ROSUVASTATIN CALCIUM 20 MG PO TABS: 20.0000 mg | ORAL_TABLET | Freq: Every day | ORAL | 3 refills | Status: DC

## 2023-03-03 NOTE — Telephone Encounter (Signed)
Patient wants to know if he can have margarine instead of butter.

## 2023-03-03 NOTE — Telephone Encounter (Signed)
Called pt's PCP who stated that the only lab work he had done last week was TSH. Last LDL was from October, which we can see under care everywhere.  Discussed with Dr Elease Hashimoto in clinic who states he is fine with increasing pt to Crestor 20mg  every day and repeat labs in 3 months. Returned call to patient and left detailed message per his request, outlining plan. Medication sent to mail order pharmacy and labs scheduled for 06/05/23.

## 2023-03-03 NOTE — Telephone Encounter (Signed)
Returned call to patient and left message per DPR stating he should NOT eat margarine and is better to use butter, but to use it sparingly.

## 2023-03-06 DIAGNOSIS — Z03818 Encounter for observation for suspected exposure to other biological agents ruled out: Secondary | ICD-10-CM | POA: Diagnosis not present

## 2023-03-06 DIAGNOSIS — J029 Acute pharyngitis, unspecified: Secondary | ICD-10-CM | POA: Diagnosis not present

## 2023-03-06 DIAGNOSIS — R051 Acute cough: Secondary | ICD-10-CM | POA: Diagnosis not present

## 2023-03-09 DIAGNOSIS — F200 Paranoid schizophrenia: Secondary | ICD-10-CM | POA: Diagnosis not present

## 2023-03-11 ENCOUNTER — Telehealth: Payer: Self-pay | Admitting: Cardiovascular Disease

## 2023-03-11 NOTE — Telephone Encounter (Signed)
Returned call to patient who states he forgot why he called.    Asks about drinking red wine, wants to know if he can have half a glass a few times per week-advised that would be okay.

## 2023-03-11 NOTE — Telephone Encounter (Signed)
Patient called to talk with Dr. Elease Hashimoto or nurse. Please call back

## 2023-03-12 ENCOUNTER — Telehealth: Payer: Self-pay | Admitting: Cardiovascular Disease

## 2023-03-12 NOTE — Telephone Encounter (Signed)
Attempted to call patient back on his cell phone. No answer and unable to leave voicemail.

## 2023-03-12 NOTE — Telephone Encounter (Signed)
Patient called to see if he can have apple sauce being that he was told to avoid sugar. I did advise they have sugar free apple sauce. He also wanted to know if he can have low fat yogurt. Did advise low fat yogurt was ok.

## 2023-03-12 NOTE — Telephone Encounter (Signed)
Patient returned call

## 2023-03-12 NOTE — Telephone Encounter (Signed)
Patient is returning phone call. Patient stated he would like [place] to call the home phone. Patient requested to NOT leave a message on his cell phone.

## 2023-03-12 NOTE — Telephone Encounter (Signed)
Pt was returning nurse call and is requesting a callback on his cell [phone]. Please advise

## 2023-03-12 NOTE — Telephone Encounter (Signed)
Left voicemail to return call to office.

## 2023-03-17 ENCOUNTER — Telehealth: Payer: Self-pay | Admitting: Cardiovascular Disease

## 2023-03-17 NOTE — Telephone Encounter (Signed)
Pt called in asking to speak to Maralyn Sago, RN when she returns tomorrow about his diet.

## 2023-03-18 DIAGNOSIS — J3489 Other specified disorders of nose and nasal sinuses: Secondary | ICD-10-CM | POA: Diagnosis not present

## 2023-03-18 DIAGNOSIS — M549 Dorsalgia, unspecified: Secondary | ICD-10-CM | POA: Diagnosis not present

## 2023-03-18 NOTE — Telephone Encounter (Signed)
Pt is following up on trying to speak with Maralyn Sago. Please advise

## 2023-03-18 NOTE — Telephone Encounter (Signed)
Returned call to patient and answered multiple dietary recommendation questions. No further concerns.

## 2023-03-25 ENCOUNTER — Telehealth: Payer: Self-pay | Admitting: Cardiovascular Disease

## 2023-03-25 ENCOUNTER — Telehealth: Payer: Self-pay

## 2023-03-25 DIAGNOSIS — Z0000 Encounter for general adult medical examination without abnormal findings: Secondary | ICD-10-CM

## 2023-03-25 NOTE — Telephone Encounter (Signed)
Patient called because he was provided my contact information to discuss healthy food choices. Patient is inquiring if he can eating organic food after having an aneurysm. Patient was informed that I do not provided specific dietary guidelines per conditions.

## 2023-03-25 NOTE — Telephone Encounter (Signed)
Returned Pts call. Pt was at the store and could not write down phone number.   I was advised to give him the number to Renaee Munda: [phone]  to help with identify healthy food choices.

## 2023-03-25 NOTE — Telephone Encounter (Signed)
Pt returning nurse's call. Please advise

## 2023-03-25 NOTE — Telephone Encounter (Signed)
Left message to call back  

## 2023-03-25 NOTE — Telephone Encounter (Signed)
New Message:        Patient wants to know if he able to have Organic Sweet Butter or Organic Plain Butter?

## 2023-03-26 NOTE — Telephone Encounter (Signed)
Patient is following up requesting a call back form Dr. Harvie Bridge nurse regarding this matter.

## 2023-03-27 NOTE — Telephone Encounter (Signed)
Returned call to patient and left message stating aneurysm does not affect his eating habits at all unless it's a food item that's high in sodium which would increase blood pressure which is not ideal in patient's who have an aortic aneurysm.

## 2023-04-13 ENCOUNTER — Telehealth: Payer: Self-pay | Admitting: Cardiovascular Disease

## 2023-04-13 NOTE — Telephone Encounter (Signed)
Patient was calling to speak to the nurse, about what he can eat. Please advise

## 2023-04-16 NOTE — Telephone Encounter (Signed)
Returned call to patient who wanted to know if he could eat [place] [employer] once weekly-states they don't have a low sodium option. Also asked about pancake syrup.

## 2023-04-17 DIAGNOSIS — E039 Hypothyroidism, unspecified: Secondary | ICD-10-CM | POA: Diagnosis not present

## 2023-04-17 DIAGNOSIS — E785 Hyperlipidemia, unspecified: Secondary | ICD-10-CM | POA: Diagnosis not present

## 2023-04-28 ENCOUNTER — Telehealth: Payer: Self-pay | Admitting: Cardiovascular Disease

## 2023-04-28 NOTE — Telephone Encounter (Signed)
Pt is requesting a callback from nurse regarding him being scheduled for CT ANGIO CHEST AORTA W/ & OR WO/CM & GATING ([place] ONLY). Please advise

## 2023-04-29 ENCOUNTER — Telehealth: Payer: Self-pay | Admitting: Cardiovascular Disease

## 2023-04-29 DIAGNOSIS — Z5982 Transportation insecurity: Secondary | ICD-10-CM

## 2023-04-29 DIAGNOSIS — Z0000 Encounter for general adult medical examination without abnormal findings: Secondary | ICD-10-CM

## 2023-04-29 NOTE — Telephone Encounter (Signed)
Patient called back stating that he is leaving home and he requests a call back on his mobile line at [phone].

## 2023-04-29 NOTE — Telephone Encounter (Signed)
Mailbox full

## 2023-04-29 NOTE — Telephone Encounter (Signed)
Patient says that it is very important to speak with Dr. Elease Hashimoto or nurse

## 2023-04-29 NOTE — Telephone Encounter (Signed)
I spoke with the pt and he says that he needs to talk with Maralyn Sago about his CT in 07/2023... he needs to talk about the location since he does not have any money to get a taxi and just wants to talk with her about the location... I advised him that I will forward to her but she will not be in the office until later in the week.

## 2023-04-30 NOTE — Telephone Encounter (Signed)
Spoke with sister patricia who lives in [place]. She verified that pt cannot afford a taxi and states he doesn't know how to do uber. She did verify that he can operate the bus system. However, patient takes care of his 64 year old mother full-time and she must go wherever Franco goes, so we would need bus tickets for each of them both ways. There is another brother that lives in [place] that's not very involved. Additionally, patient has another sister (only local sibling) but they don't have a good relationship and reportedly she has mental health issues as well (bipolar, schizophrenia). Referral has been placed to social work to get involved to see if they can assist him with transportation on a more permanent basis. Will await thoughts from social work on bus passes/other options, then can reach out to hospital to reschedule his January CT.

## 2023-04-30 NOTE — Telephone Encounter (Signed)
Patients sister returning call

## 2023-04-30 NOTE — Telephone Encounter (Signed)
Called and spoke with patient who states that the CTA he's scheduled for in Jan 2025, he has no way to get there. States between now and then, he will still have no way and he cannot afford a taxi/uber. His brother who used to help him has passed away. Has a sister on DPR, reached out to her twice and left message. Can likely get him bus passes to and from hospital, but uncertain if he can manage getting to bus pickup and on/off at right place. Awaiting call back, then if needed, can pass along to Child psychotherapist.

## 2023-05-01 ENCOUNTER — Telehealth (HOSPITAL_BASED_OUTPATIENT_CLINIC_OR_DEPARTMENT_OTHER): Payer: Self-pay | Admitting: Licensed Clinical Social Worker

## 2023-05-01 NOTE — Progress Notes (Signed)
Heart and Vascular Care Navigation  05/01/2023  Andrew Jennings 1961-01-17 [id number]  Reason for Referral: transportation, financial challenges Patient is participating in a Managed Medicaid Plan: No, Humana Medicare and Medicaid for Part B Premium  Engaged with patient by telephone for initial visit for Heart and Vascular Care Coordination.                                                                                                   Assessment:                          LCSW was able to reach pt today at [phone]. Introduced self, role, reason for call. Confirmed home address, resides in an apartment with his 66 yo mother. They have family out of state, and pt has a sister locally that assists with picking up food for the home. Neither he nor his mother receive SNAP (over income). Do fine with rent and utilities, discussed [employer] applications which open 12/1. He denies any issues obtaining/affording medications at this time.    Pt mother uses rollator to get around but since pt is primary caregiver she goes everywhere with pt. Neither of them drive, they can use bus and pt was using friend for rides (he doesn't feel comfortable continuing to use this). He refuses to use [employer] (sounds like he previously had used it), due to inconsistent pick up/wait times. Pt concerned about upcoming CT in January. LCSW shared that we can call his insurance and see if they have benefits and attempt to use those. If unable to use those/does not have benefits then we can utilize our Heart/Vascular contracted services. I will need to confirm pt mother can accompany him safely to that appt (has somewhere to sit etc).   HRT/VAS Care Coordination     Patients Home Cardiology Office [facility]   Outpatient Care Team Social Worker   Social Worker Name: [name], [place], [phone]   Living arrangements for the past 2 months Apartment   Lives with: Self; Parents   Patient Has Concern  With Paying Medical Bills No   Does Patient Have Prescription Coverage? Yes   Home Assistive Devices/Equipment None       Social History:                                                                             SDOH Screenings   Food Insecurity: No Food Insecurity (05/01/2023)  Housing: Low Risk  (05/01/2023)  Transportation Needs: Unmet Transportation Needs (05/01/2023)  Utilities: Not At Risk (05/01/2023)  Financial Resource Strain: Low Risk  (05/01/2023)  Tobacco Use: Medium Risk (02/09/2023)  Health Literacy: Adequate Health Literacy (05/01/2023)    SDOH Interventions: Financial Resources:  Financial Strain Interventions: Other (Comment) (sent transportation resources and  LIEAP application) DSS for financial assistance  Food Insecurity:  Food Insecurity Interventions: Other (Comment), Intervention Not Indicated (over income for SNAP- pt sister assists pt/pt mother with food shopping)  Housing Insecurity:  Housing Interventions: Intervention Not Indicated  Transportation:   Transportation Interventions: Patient Resources (Friends/Family), Payor Benefit    Other Care Navigation Interventions:     Provided Pharmacy assistance resources  Pt denies any issues with obtaining medications at this time- does have Extra Help  Patient expressed Mental Health concerns No.Note pt has medication mgmt and counseling for mental health dx.   Follow-up plan:   LCSW mailed pt  the following: transportation resources, LIEAP application and instructions and my card. I will call Monday and provide information about [employer] benefits with his insurance: [phone]. If these are able to be used then we will discuss alternate arrangements closer to CT in January. Confirmed pt mother can sit in waiting area during his CT.

## 2023-05-04 ENCOUNTER — Telehealth: Payer: Self-pay | Admitting: Licensed Clinical Social Worker

## 2023-05-04 NOTE — Telephone Encounter (Signed)
H&V Care Navigation CSW Progress Note  Clinical Social Worker contacted patient by phone to f/u on call to our call center. Was able to reach pt back at [phone]. Note he has cancelled his labs and opted to have them done at PCP office per notes. He confirmed he had CT time and date. Agreeable to me following up with him closer to that date.  Patient is participating in a Managed Medicaid Plan:  No, Humana Medicare   SDOH Screenings   Food Insecurity: No Food Insecurity (05/01/2023)  Housing: Low Risk  (05/01/2023)  Transportation Needs: Unmet Transportation Needs (05/01/2023)  Utilities: Not At Risk (05/01/2023)  Financial Resource Strain: Low Risk  (05/01/2023)  Tobacco Use: Medium Risk (02/09/2023)  Health Literacy: Adequate Health Literacy (05/01/2023)    [name], MSW, LCSW Clinical Social Worker II [facility] Heart/Vascular Care Navigation  [phone]- work cell phone (preferred) [phone]- desk phone

## 2023-05-04 NOTE — Telephone Encounter (Signed)
H&V Care Navigation CSW Progress Note  Clinical Social Worker contacted patient by phone to f/u on transportation needs. Pt cell phone which he had asked to be called on [phone]) had a full voicemail, was able to reach him at [phone]. We discussed that per insurance [employer] at [facility] he has access to rides through his insurance plan by calling [phone]. Provided pt this number and he noted it down. LCSW has also mailed him that ride information as well. Pt upcoming appt isnt until 11/22 and I encouraged him to call closer to that time, I will f/u close to then as well. Pt upset that the appt noted to be at 9am- "too early." I provided him with clinic number and requested he call and clarify if that is the only time or if he can come to lab later in the day.   I shared that his mother can also come to his CT in January, we will see how [facility] Medicare ride works and if practical then he should use it to arrange his ride to appt in January. If impractical to use benefits we can assist with ride.   Patient is participating in a Managed Medicaid Plan:  No, Humana Medicare only  SDOH Screenings   Food Insecurity: No Food Insecurity (05/01/2023)  Housing: Low Risk  (05/01/2023)  Transportation Needs: Unmet Transportation Needs (05/01/2023)  Utilities: Not At Risk (05/01/2023)  Financial Resource Strain: Low Risk  (05/01/2023)  Tobacco Use: Medium Risk (02/09/2023)  Health Literacy: Adequate Health Literacy (05/01/2023)   [name], MSW, LCSW Clinical Social Worker II [facility] Heart/Vascular Care Navigation  [phone]- work cell phone (preferred) [phone]- desk phone

## 2023-05-05 ENCOUNTER — Telehealth: Payer: Self-pay | Admitting: Licensed Clinical Social Worker

## 2023-05-05 NOTE — Telephone Encounter (Signed)
H&V Care Navigation CSW Progress Note  Clinical Social Worker  received a call from pt  to f/u on transportation resources. Pt extremely agitated about being overwhelmed trying to arrange a ride. I attempted to calm pt and inquired what appt pt is trying to schedule a ride to as there is none in our system but may have external medical appt. Pt shares he needs to get his hair cut and has an appt today. LCSW clarified that pt unfortunately cannot utilize rides to get to hair cuts, grocery store etc but he can use them to get to pharmacy, medical appts. Pt states understanding. He shares he is very frustrated by not knowing "which number to press." LCSW clarified if pt has an upcoming appt that he needs to call closer to that time and our team can help walk him through it. He has an eye appt at [facility] in November. Encouraged him to call me closer to that time (at least 3 business days before that appt). I also reviewed how [employer] answering service works- the only think that pt need to select is #1 for English, then he should have his [facility] Medicare ID # and addresses for home and appt to provide to scheduler.   Patient is participating in a Managed Medicaid Plan:  No, Humana Medicare  SDOH Screenings   Food Insecurity: No Food Insecurity (05/01/2023)  Housing: Low Risk  (05/01/2023)  Transportation Needs: Unmet Transportation Needs (05/01/2023)  Utilities: Not At Risk (05/01/2023)  Financial Resource Strain: Low Risk  (05/01/2023)  Tobacco Use: Medium Risk (02/09/2023)  Health Literacy: Adequate Health Literacy (05/01/2023)   [name], MSW, LCSW Clinical Social Worker II [facility] Heart/Vascular Care Navigation  [phone]- work cell phone (preferred) [phone]- desk phone

## 2023-05-06 ENCOUNTER — Telehealth: Payer: Self-pay | Admitting: Cardiovascular Disease

## 2023-05-06 NOTE — Telephone Encounter (Signed)
Pt is requesting a callback to discuss his medications since his pcp hasn't gotten back to him. Please advise

## 2023-05-06 NOTE — Telephone Encounter (Signed)
Returned call to patient who wanted to know if he's supposed to be taking Sildenafil every other day. Advised him he's only to take it if he needs it, it's not a maintenance medication. Pt states he understands.  Additionally, he states that he did get a call from social worker Rutherford Nail who is arranging transportation through [employer] for patient to get CT, but he was told to call them and he doesn't have a number. He's asking that Osa Craver reach back out to him. Will route her this message.

## 2023-05-06 NOTE — Telephone Encounter (Signed)
H&V Care Navigation CSW Progress Note  Clinical Social Worker contacted patient by phone to f/u on [employer] transportation #- [employer] [phone]. Pt has been mailed this information as well. Left voicemail on pt home phone [phone]. Was able to reach him on cell phone [phone]. Pt upset bc he has upcoming appts with psychiatry and eye dr and needs rides. LCSW confirmed pt has number and offered to help walk through scheduling with pt so he can practice/better understand the system. Pt upset stating "I thought you were a Child psychotherapist I need you to schedule these for me," LCSW clarified role and how I can be of assistance. I am unable to call and schedule rides for all of patient's appointments as we do not have access to all of that information and unfortunately we do not have the ability to call to schedule rides for all patients for all appointments. I shared again that I would be happy to help walk pt through calling so he feels more comfortable- pt declines, tells me to just call him in January.   LCSW then called pt sister Theresa Mulligan [phone] who resides in [place]. Since no DPR on file I did not discuss patient medical concerns but I did provide her with the number for transportation and shared pt may need more assistance with navigating scheduling these rides, shared he had been frustrated with that fact I cannot call for him everytime but I remain available to assist with pt practicing this and assisting for upcoming CT in January. Pt sister appreciative of information and will assist pt. She shares her sister in law Jaremiah Heuberger resides in [facility] and may be able to also assist after her work day. I also requested pt sister speak with pt about completing updated DPR as we have multiple dated ones on file with several contacts that are not the most relevant assistance at this time. Pt sister will discuss this with pt.   Patient is participating in a Managed Medicaid Plan:   No, Humana Medicare  SDOH Screenings   Food Insecurity: No Food Insecurity (05/01/2023)  Housing: Low Risk  (05/01/2023)  Transportation Needs: Unmet Transportation Needs (05/01/2023)  Utilities: Not At Risk (05/01/2023)  Financial Resource Strain: Low Risk  (05/01/2023)  Tobacco Use: Medium Risk (02/09/2023)  Health Literacy: Adequate Health Literacy (05/01/2023)    [name], MSW, LCSW Clinical Social Worker II [facility] Health Heart/Vascular Care Navigation  [phone]- work cell phone (preferred) [phone]- desk phone

## 2023-05-08 ENCOUNTER — Telehealth: Payer: Self-pay | Admitting: Cardiovascular Disease

## 2023-05-08 DIAGNOSIS — I7121 Aneurysm of the ascending aorta, without rupture: Secondary | ICD-10-CM | POA: Diagnosis not present

## 2023-05-08 DIAGNOSIS — E785 Hyperlipidemia, unspecified: Secondary | ICD-10-CM | POA: Diagnosis not present

## 2023-05-08 DIAGNOSIS — I719 Aortic aneurysm of unspecified site, without rupture: Secondary | ICD-10-CM | POA: Diagnosis not present

## 2023-05-08 DIAGNOSIS — F209 Schizophrenia, unspecified: Secondary | ICD-10-CM | POA: Diagnosis not present

## 2023-05-08 DIAGNOSIS — Z0000 Encounter for general adult medical examination without abnormal findings: Secondary | ICD-10-CM | POA: Diagnosis not present

## 2023-05-08 DIAGNOSIS — E039 Hypothyroidism, unspecified: Secondary | ICD-10-CM | POA: Diagnosis not present

## 2023-05-08 DIAGNOSIS — Z125 Encounter for screening for malignant neoplasm of prostate: Secondary | ICD-10-CM | POA: Diagnosis not present

## 2023-05-08 DIAGNOSIS — R002 Palpitations: Secondary | ICD-10-CM | POA: Diagnosis not present

## 2023-05-08 NOTE — Telephone Encounter (Signed)
Patient called to thank RN Sarah directly for all of her help.

## 2023-05-13 ENCOUNTER — Telehealth: Payer: Self-pay | Admitting: Cardiovascular Disease

## 2023-05-13 NOTE — Telephone Encounter (Signed)
Pt states he would like to speak with nurse Sarah regarding some blood work he had done. Please advise

## 2023-05-13 NOTE — Telephone Encounter (Signed)
Pt calling back to get update, requesting cb

## 2023-05-14 NOTE — Telephone Encounter (Signed)
Follow Up:     Patient would like for Maralyn Sago to please call him, when she is back in the office    Pt c/o medication issue:  1. Name of Medication:  Sildenafil  2. How are you currently taking this medication (dosage and times per day)?  He wanted Maralyn Sago to know that he take it every 3 days instead of every other day.  3. Are you having a reaction (difficulty breathing--STAT)?   4. What is your medication issue?  Marland Kitchen

## 2023-05-14 NOTE — Telephone Encounter (Signed)
Left voicemail to return call to office.

## 2023-05-15 NOTE — Telephone Encounter (Signed)
Pt called and said his home number is not working. He ask to return his call on his moblie number [phone]

## 2023-05-15 NOTE — Telephone Encounter (Signed)
Returned call to patient who states he's decreased his sildenafil to every 3 days, rather than every day and wanted to be sure this was okay. He also wanted to make sure he can eat both syrup and french toast;advised yes, in moderation. Questioned about olive oil, advised that it's a healthy oil and recommended by cardiologists (mediterranean diet).   Mentions that he is very unhappy with his sister and appreciates [name] trying to help him, but he doesn't want this to happen any longer/again.

## 2023-05-15 NOTE — Telephone Encounter (Signed)
Patient was returning phone call 

## 2023-05-18 DIAGNOSIS — F200 Paranoid schizophrenia: Secondary | ICD-10-CM | POA: Diagnosis not present

## 2023-05-20 ENCOUNTER — Telehealth: Payer: Self-pay | Admitting: Cardiovascular Disease

## 2023-05-20 NOTE — Telephone Encounter (Signed)
Patient called to follow-up on his lab results. 

## 2023-05-22 ENCOUNTER — Telehealth: Payer: Self-pay | Admitting: Cardiovascular Disease

## 2023-05-22 NOTE — Telephone Encounter (Signed)
Patient requesting call back from Maralyn Sago, [place]. States that it is in regards to medication, but would give no further information.

## 2023-05-25 NOTE — Telephone Encounter (Signed)
Returned call to patient who wanted to know if he could eat eggs (advised he try cutting them down by substituting egg whites in place of one egg each time he eats them) and meatballs (educated to stick to no more than 2-3 times per week of any red meat). Also clarified time and date for upcoming CT scan. No further questions.

## 2023-05-25 NOTE — Telephone Encounter (Signed)
Completed in separate encounter

## 2023-05-25 NOTE — Telephone Encounter (Signed)
Returned call to patient, no answer. LMTCB.

## 2023-05-26 ENCOUNTER — Telehealth: Payer: Self-pay | Admitting: Cardiovascular Disease

## 2023-05-26 NOTE — Telephone Encounter (Signed)
Patient is requesting to speak with RN Maralyn Sago in regards to a letter he received. Please advise.

## 2023-05-27 ENCOUNTER — Telehealth: Payer: Self-pay | Admitting: Cardiovascular Disease

## 2023-05-27 NOTE — Telephone Encounter (Signed)
Patient wanted to let Eugene Gavia know that she does not have to call him back and wanted to thank her for her help and time

## 2023-05-28 ENCOUNTER — Telehealth: Payer: Self-pay | Admitting: Licensed Clinical Social Worker

## 2023-05-28 NOTE — Telephone Encounter (Signed)
H&V Care Navigation CSW Progress Note  Clinical Social Worker contacted patient by phone to f/u on response from DSS. Cathlyn Parsons, DSS eligibilty caseworker responded back and let me know the following. "Andrew Jennings does have an active Medicaid QMB case and his caseworker is Air traffic controller.  She just completed the recertification a few months ago and unfortunately, his SS disability benefits remain over the income limit for full Medicaid for the disabled.  Although he's not yet 63, since he already receives Medicare he's not eligible for expanded Medicaid.  The MQB is the only program he remains eligible for right now.  If he has questions or experiences any changes, Ms. Dalton's phone # is [phone]."   I reached pt on cell as requested this time at [phone], shared the above information. Pt states "did you tell her about my teeth," since he recently had what sounds like dentures or implants made. I shared that my understanding is that his income is what's keeping him out of eligibility for full Medicaid, attempted to have him write down Myra's number but he states he doesn't have the ability bc he is at the apartment club house watching television. He is okay with my explanation at this time.   Patient is participating in a Managed Medicaid Plan:  No, Humana Medicare and partial Medicaid.   SDOH Screenings   Food Insecurity: No Food Insecurity (05/01/2023)  Housing: Low Risk  (05/01/2023)  Transportation Needs: Unmet Transportation Needs (05/01/2023)  Utilities: Not At Risk (05/01/2023)  Financial Resource Strain: Low Risk  (05/01/2023)  Tobacco Use: Medium Risk (02/09/2023)  Health Literacy: Adequate Health Literacy (05/01/2023)   Andrew Jennings, MSW, LCSW Clinical Social Worker II [facility] Health Heart/Vascular Care Navigation  [phone]- work cell phone (preferred) [phone]- desk phone

## 2023-05-28 NOTE — Telephone Encounter (Signed)
H&V Care Navigation CSW Progress Note  Clinical Social Worker contacted patient by phone to f/u on voicemails (2 left back to back) left on desk phone. Was at [facility] clinic yesterday, returned to [facility] clinic today- was able to reach pt at [phone]. He states he wants full Medicaid and begins listing his medical conditions. LCSW clarified pt already receiving disability, discussed Medicaid eligibility depends on income and asset guidelines as well as disability status. I shared that I could reach out to our DSS colleagues at [facility] to see if they can call or have his assigned caseworker call him to discuss. Pt agreeable, asks they call his cell. Then pt called two more times to share he thinks his caseworker is Kara Dies and doesn't have direct number for her. Shared again I would reach out to DSS and he would need to discuss this with them, he states understanding. Let him know that I am out of office tomorrow, will not have access to my messages until my return if he calls.   Noted per previous phone notes that pt had told Dema Severin, RN, "appreciates [name] trying to help him, but he doesn't want this to happen any longer/again." Shared with her the above, and that pt had contacted me for assistance in case he calls in again stating he does not want me to help. Remain available as needed for pt if interested, he does not have another scheduled visit with our offices until January. We will reach out to him at that time about transportation.   Patient is participating in a Managed Medicaid Plan:  No, Humana Medicare only, Medicaid currently is only to pay Part B premium  SDOH Screenings   Food Insecurity: No Food Insecurity (05/01/2023)  Housing: Low Risk  (05/01/2023)  Transportation Needs: Unmet Transportation Needs (05/01/2023)  Utilities: Not At Risk (05/01/2023)  Financial Resource Strain: Low Risk  (05/01/2023)  Tobacco Use: Medium Risk (02/09/2023)  Health  Literacy: Adequate Health Literacy (05/01/2023)   [name], MSW, LCSW Clinical Social Worker II [facility] Health Heart/Vascular Care Navigation  [phone]- work cell phone (preferred) [phone]- desk phone

## 2023-05-28 NOTE — Telephone Encounter (Signed)
05/27/23 10:31 AM Note Patient wanted to let Andrew Jennings know that she does not have to call him back and wanted to thank her for her help and time

## 2023-06-01 NOTE — Telephone Encounter (Signed)
H&V Care Navigation CSW Progress Note  Clinical Social Worker  received additional call from pt before end of clinic  to request DSS caseworkers number. Provided Myra Dalton's number- also answered additional questions about LIEAP application sent. Pt can apply through DSS on 06/14/23. Pt states understanding, no additional questions at this time.  Patient is participating in a Managed Medicaid Plan:  No, Humana Medicare only, not eligible for Medicaid  SDOH Screenings   Food Insecurity: No Food Insecurity (05/01/2023)  Housing: Low Risk  (05/01/2023)  Transportation Needs: Unmet Transportation Needs (05/01/2023)  Utilities: Not At Risk (05/01/2023)  Financial Resource Strain: Low Risk  (05/01/2023)  Tobacco Use: Medium Risk (02/09/2023)  Health Literacy: Adequate Health Literacy (05/01/2023)   Andrew Jennings, MSW, LCSW Clinical Social Worker II [facility] Health Heart/Vascular Care Navigation  [phone]- work cell phone (preferred) [phone]- desk phone

## 2023-06-02 DIAGNOSIS — H43393 Other vitreous opacities, bilateral: Secondary | ICD-10-CM | POA: Diagnosis not present

## 2023-06-02 DIAGNOSIS — Z0100 Encounter for examination of eyes and vision without abnormal findings: Secondary | ICD-10-CM | POA: Diagnosis not present

## 2023-06-03 DIAGNOSIS — Z0100 Encounter for examination of eyes and vision without abnormal findings: Secondary | ICD-10-CM | POA: Diagnosis not present

## 2023-06-05 ENCOUNTER — Other Ambulatory Visit: Payer: Medicare HMO

## 2023-06-08 ENCOUNTER — Telehealth: Payer: Self-pay | Admitting: Cardiovascular Disease

## 2023-06-08 NOTE — Telephone Encounter (Signed)
Patient is calling wanting to know what vitamins he can take to benefit his heart health. He is requesting a callback on the cell, but ask if you do not get him to leave a VM on his home phone not his cell.   Please advise.

## 2023-06-08 NOTE — Telephone Encounter (Signed)
Spoke with patient to inform him that Dr Elease Hashimoto just recommended a multivitamin. His TG's are good, so need for fish oil. He wanted to know if he should have beets, informed him that it's okay if he would like it, but its not a proven recommended treatment.

## 2023-06-19 ENCOUNTER — Telehealth: Payer: Self-pay | Admitting: Cardiovascular Disease

## 2023-06-19 NOTE — Telephone Encounter (Signed)
Patient calling to speak to the nurse about something things. Please advise

## 2023-06-22 NOTE — Telephone Encounter (Signed)
Returned call to patient, no answer. Left message that I will try to reach back out later today.

## 2023-06-22 NOTE — Telephone Encounter (Signed)
Pt called back and said to c/b on his mobile number [phone]

## 2023-06-22 NOTE — Telephone Encounter (Signed)
Called and spoke with patient who states he drinks 4 beers/day and wanted to know if this was okay. Advised that we don't recommend more than 1 drink per day and that he try to cut intake at least by half. Also asked about Cheerios. Advised this is okay, but the heart-healthy advertising is misleading and any soluble fiber food would have same benefit. Verbalized understanding.

## 2023-07-06 DIAGNOSIS — M546 Pain in thoracic spine: Secondary | ICD-10-CM | POA: Diagnosis not present

## 2023-07-13 ENCOUNTER — Telehealth: Payer: Self-pay | Admitting: Cardiovascular Disease

## 2023-07-13 NOTE — Telephone Encounter (Signed)
Spoke with patient and he wants to speak with Andrew Jennings

## 2023-07-13 NOTE — Telephone Encounter (Signed)
Pt returning nurses call. Call transferred 

## 2023-07-13 NOTE — Telephone Encounter (Signed)
Left voicemail to return call to office.

## 2023-07-13 NOTE — Telephone Encounter (Signed)
Returned call to patient who wanted to know if he could have sandwiches. I advised that his LDL as of 05/19/23 was 62 which is below 70, and triglycerides were 68 so sandwiches are fine. Advised that he needs to watch what he's putting in his sandwiches (I.e sodium in meats) and make sure he's always getting the one marked "low sodium." He states he does and doesn't eat any mayonnaise or oil on his sandwiches. Eats one every other day. Has been eating french toast lately-asks if syrup is okay, advised him yes. Appreciative of call

## 2023-07-13 NOTE — Telephone Encounter (Signed)
 Patient is calling to speak with Dr. Elease Hashimoto or nurse

## 2023-07-21 ENCOUNTER — Telehealth: Payer: Self-pay | Admitting: Licensed Clinical Social Worker

## 2023-07-21 NOTE — Telephone Encounter (Signed)
 H&V Care Navigation CSW Progress Note  Clinical Social Worker contacted patient by phone to f/u on appt for CT next week and transportation needs. Was able to reach him this morning at [phone]. Pt appreciative of call- shares I have a ride, I have an uber driver now. Declines need for a ride, however I shared if he runs into any issues to give either myself or [employer] office a call to let us  know of need for a ride. Pt inquiring about copay- I am not aware of anything due up front but shared that billing and CT navigators may be able to assist with further clarity or an estimate. Will route this to Lauraine, RN, as she speaks with pt frequently.   Patient is participating in a Managed Medicaid Plan:  No, Humana Medicare only  SDOH Screenings   Food Insecurity: No Food Insecurity (05/01/2023)  Housing: Low Risk  (05/01/2023)  Transportation Needs: Unmet Transportation Needs (05/01/2023)  Utilities: Not At Risk (05/01/2023)  Financial Resource Strain: Low Risk  (05/01/2023)  Tobacco Use: Medium Risk (02/09/2023)  Health Literacy: Adequate Health Literacy (05/01/2023)    Marit Lark, MSW, LCSW Clinical Social Worker II [facility] Health Heart/Vascular Care Navigation  [phone]- work cell phone (preferred) [phone]- desk phone

## 2023-07-24 DIAGNOSIS — R519 Headache, unspecified: Secondary | ICD-10-CM | POA: Diagnosis not present

## 2023-07-24 DIAGNOSIS — F209 Schizophrenia, unspecified: Secondary | ICD-10-CM | POA: Diagnosis not present

## 2023-07-24 DIAGNOSIS — E785 Hyperlipidemia, unspecified: Secondary | ICD-10-CM | POA: Diagnosis not present

## 2023-07-24 DIAGNOSIS — I7121 Aneurysm of the ascending aorta, without rupture: Secondary | ICD-10-CM | POA: Diagnosis not present

## 2023-07-27 ENCOUNTER — Telehealth: Payer: Self-pay | Admitting: Cardiovascular Disease

## 2023-07-27 NOTE — Telephone Encounter (Signed)
 Pt is requesting a callback to get instructions on fasting and what he should or shouldn't do before having his CT ANGIO CHEST AORTA W/ & OR WO/CM & GATING ([place] ONLY) done. Please advise

## 2023-07-28 ENCOUNTER — Telehealth: Payer: Self-pay | Admitting: Cardiovascular Disease

## 2023-07-28 NOTE — Telephone Encounter (Signed)
 Patient is calling back to speak with the nurse. Please advise

## 2023-07-28 NOTE — Telephone Encounter (Signed)
 Duplicate encounter

## 2023-07-28 NOTE — Telephone Encounter (Signed)
 Spoke with patient and informed him that he doesn't need to fast prior to aortic CTA, no dietary restrictions. He is very eager to know results. Informed him I'd call as soon as I have MD's recommendation.

## 2023-07-28 NOTE — Telephone Encounter (Signed)
 Patient is calling to talk with Dr. Elease Hashimoto or nurse about results

## 2023-07-29 ENCOUNTER — Ambulatory Visit (HOSPITAL_COMMUNITY)
Admission: RE | Admit: 2023-07-29 | Discharge: 2023-07-29 | Disposition: A | Discharge status: Home / Self Care | Payer: Medicare HMO | Source: Ambulatory Visit | Attending: Cardiovascular Disease | Admitting: Cardiovascular Disease

## 2023-07-29 ENCOUNTER — Other Ambulatory Visit (HOSPITAL_COMMUNITY): Payer: Medicare HMO

## 2023-07-29 DIAGNOSIS — J432 Centrilobular emphysema: Secondary | ICD-10-CM | POA: Diagnosis not present

## 2023-07-29 DIAGNOSIS — I7123 Aneurysm of the descending thoracic aorta, without rupture: Secondary | ICD-10-CM | POA: Diagnosis not present

## 2023-07-29 DIAGNOSIS — I7121 Aneurysm of the ascending aorta, without rupture: Secondary | ICD-10-CM | POA: Insufficient documentation

## 2023-07-29 MED ORDER — IOHEXOL 350 MG/ML SOLN
75.0000 mL | Freq: Once | INTRAVENOUS | Status: AC | PRN
  Administered 2023-07-29: 75 mL via INTRAVENOUS

## 2023-07-29 NOTE — Telephone Encounter (Signed)
 Patient is following up to see if results have been reviewed.

## 2023-07-29 NOTE — Telephone Encounter (Signed)
 Patient called back asking that we called his sister with the results. Please advise

## 2023-07-29 NOTE — Telephone Encounter (Signed)
 Returned call to patient and explained that CTA hasn't resulted yet. I will call him as soon as results are available. He asks that I also call sister and brother listed on DPR and give them results as well.

## 2023-07-30 ENCOUNTER — Telehealth: Payer: Self-pay | Admitting: Cardiovascular Disease

## 2023-07-30 NOTE — Telephone Encounter (Signed)
Patient is calling to follow up on results. Explained to patient the results are not ready as of now and we will call the patient once we receive the results. Please advise.

## 2023-07-30 NOTE — Telephone Encounter (Signed)
 Error

## 2023-08-05 ENCOUNTER — Telehealth: Payer: Self-pay

## 2023-08-05 DIAGNOSIS — I7121 Aneurysm of the ascending aorta, without rupture: Secondary | ICD-10-CM

## 2023-08-05 NOTE — Telephone Encounter (Signed)
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    Ao root measures 4.5 cm . The aortic root size measures smaller in this study compared to his previous CTA in Aug. 2024.  Overall, his aorta appears stable   Repeat CTA of his thoracic aorta in 1 year

## 2023-08-05 NOTE — Telephone Encounter (Signed)
Patient called.  Patient aware. Asked that I call sister patricia and brother michael and make them aware of results also. Did speak directly with both and all questions answered. Repeat order placed to be done next year.

## 2023-08-06 NOTE — Telephone Encounter (Signed)
Lungs/Pleura: The lungs are essentially clear. Interval resolution of ground-glass attenuation airspace opacities from the posteroinferior aspect of the right upper lobe. Mild centrilobular pulmonary emphysema. Stable benign subpleural nodule/granuloma along the minor fissure measuring 4-5 mm. No suspicious pulmonary mass or nodule. No pleural effusion or pneumothorax.  Returned call to patient and advised that the pulmonary nodule hasn't changed in size and no indication that it's cancer (his question and concern).

## 2023-08-06 NOTE — Telephone Encounter (Signed)
Pt calling back asking about these results again. He asked if the bump on his lungs is still there.

## 2023-08-10 ENCOUNTER — Telehealth: Payer: Self-pay | Admitting: Cardiovascular Disease

## 2023-08-10 NOTE — Telephone Encounter (Signed)
Patient is requesting to have Maralyn Sago, RN contact his sister, Dennie Bible, at [phone], and if she is unavailable please call brother, Casimiro Needle, at [phone].

## 2023-08-10 NOTE — Telephone Encounter (Signed)
Returned call to patient and left message to find out what I need to be calling sister and brother about.

## 2023-08-10 NOTE — Telephone Encounter (Signed)
Patient states he wanted me to call his sister and brother who help take care of him and tell them that he's due for visit in may of this year so that they can remind him of his appt. I explained to him that I can send myself a reminder and I'll call him in March to schedule the appt. Pt appreciative.

## 2023-08-17 DIAGNOSIS — F200 Paranoid schizophrenia: Secondary | ICD-10-CM | POA: Diagnosis not present

## 2023-08-24 ENCOUNTER — Telehealth: Payer: Self-pay | Admitting: Cardiovascular Disease

## 2023-08-24 NOTE — Telephone Encounter (Signed)
 Pt wants to talk to nurse Isa Manuel about scheduling a CT Scan

## 2023-08-27 NOTE — Telephone Encounter (Signed)
Returned call to patient to remind him that his CTA is not due until jan 2026 and he will be called closer to time to schedule. Pt has no other questions.

## 2023-08-30 DIAGNOSIS — R9431 Abnormal electrocardiogram [ECG] [EKG]: Secondary | ICD-10-CM | POA: Diagnosis not present

## 2023-08-30 DIAGNOSIS — R448 Other symptoms and signs involving general sensations and perceptions: Secondary | ICD-10-CM | POA: Diagnosis not present

## 2023-08-30 DIAGNOSIS — R202 Paresthesia of skin: Secondary | ICD-10-CM | POA: Diagnosis not present

## 2023-08-30 DIAGNOSIS — R42 Dizziness and giddiness: Secondary | ICD-10-CM | POA: Diagnosis not present

## 2023-09-01 ENCOUNTER — Other Ambulatory Visit: Payer: Self-pay

## 2023-09-01 ENCOUNTER — Emergency Department (HOSPITAL_COMMUNITY)
Admission: EM | Admit: 2023-09-01 | Discharge: 2023-09-02 | Disposition: A | Discharge status: Home / Self Care | Payer: Medicare HMO | Attending: Emergency Medicine | Admitting: Emergency Medicine

## 2023-09-01 ENCOUNTER — Encounter (HOSPITAL_COMMUNITY): Payer: Self-pay | Admitting: Emergency Medicine

## 2023-09-01 DIAGNOSIS — E039 Hypothyroidism, unspecified: Secondary | ICD-10-CM | POA: Insufficient documentation

## 2023-09-01 DIAGNOSIS — F419 Anxiety disorder, unspecified: Secondary | ICD-10-CM | POA: Insufficient documentation

## 2023-09-01 DIAGNOSIS — R9431 Abnormal electrocardiogram [ECG] [EKG]: Secondary | ICD-10-CM | POA: Diagnosis not present

## 2023-09-01 DIAGNOSIS — Z79890 Hormone replacement therapy (postmenopausal): Secondary | ICD-10-CM | POA: Insufficient documentation

## 2023-09-01 DIAGNOSIS — F209 Schizophrenia, unspecified: Secondary | ICD-10-CM | POA: Insufficient documentation

## 2023-09-01 DIAGNOSIS — E785 Hyperlipidemia, unspecified: Secondary | ICD-10-CM | POA: Insufficient documentation

## 2023-09-01 DIAGNOSIS — F22 Delusional disorders: Secondary | ICD-10-CM | POA: Diagnosis not present

## 2023-09-01 DIAGNOSIS — Z7982 Long term (current) use of aspirin: Secondary | ICD-10-CM | POA: Diagnosis not present

## 2023-09-01 LAB — CBC WITH DIFFERENTIAL/PLATELET
Abs Immature Granulocytes: 0.01 10*3/uL (ref 0.00–0.07)
Basophils Absolute: 0 10*3/uL (ref 0.0–0.1)
Basophils Relative: 0 %
Eosinophils Absolute: 0.1 10*3/uL (ref 0.0–0.5)
Eosinophils Relative: 1 %
HCT: 44.6 % (ref 39.0–52.0)
Hemoglobin: 15.2 g/dL (ref 13.0–17.0)
Immature Granulocytes: 0 %
Lymphocytes Relative: 22 %
Lymphs Abs: 1.5 10*3/uL (ref 0.7–4.0)
MCH: 29.7 pg (ref 26.0–34.0)
MCHC: 34.1 g/dL (ref 30.0–36.0)
MCV: 87.3 fL (ref 80.0–100.0)
Monocytes Absolute: 0.8 10*3/uL (ref 0.1–1.0)
Monocytes Relative: 12 %
Neutro Abs: 4.3 10*3/uL (ref 1.7–7.7)
Neutrophils Relative %: 65 %
Platelets: 297 10*3/uL (ref 150–400)
RBC: 5.11 MIL/uL (ref 4.22–5.81)
RDW: 12.7 % (ref 11.5–15.5)
WBC: 6.7 10*3/uL (ref 4.0–10.5)
nRBC: 0 % (ref 0.0–0.2)

## 2023-09-01 LAB — COMPREHENSIVE METABOLIC PANEL
ALT: 38 U/L (ref 0–44)
AST: 50 U/L — ABNORMAL HIGH (ref 15–41)
Albumin: 3.9 g/dL (ref 3.5–5.0)
Alkaline Phosphatase: 29 U/L — ABNORMAL LOW (ref 38–126)
Anion gap: 6 (ref 5–15)
BUN: 20 mg/dL (ref 8–23)
CO2: 29 mmol/L (ref 22–32)
Calcium: 9.6 mg/dL (ref 8.9–10.3)
Chloride: 104 mmol/L (ref 98–111)
Creatinine, Ser: 1.31 mg/dL — ABNORMAL HIGH (ref 0.61–1.24)
GFR, Estimated: >60 mL/min (ref >60)
Glucose, Bld: 110 mg/dL — ABNORMAL HIGH (ref 70–99)
Potassium: 4.3 mmol/L (ref 3.5–5.1)
Sodium: 139 mmol/L (ref 135–145)
Total Bilirubin: 0.8 mg/dL (ref 0.0–1.2)
Total Protein: 6.5 g/dL (ref 6.5–8.1)

## 2023-09-01 LAB — ETHANOL: Alcohol, Ethyl (B): <10 mg/dL (ref <10)

## 2023-09-01 MED ORDER — HALOPERIDOL 5 MG PO TABS
5.0000 mg | ORAL_TABLET | Freq: Once | ORAL | Status: AC
  Administered 2023-09-02: 5 mg via ORAL
  Filled 2023-09-01: qty 1

## 2023-09-01 NOTE — ED Provider Notes (Signed)
 [place] EMERGENCY DEPARTMENT AT [facility] Provider Note   CSN: [id number] Arrival date & time: 09/01/23  1418     History {Add pertinent medical, surgical, social history, OB history to HPI:1} Chief Complaint  Patient presents with   Paranoid   Anxiety    Andrew Jennings is a 63 y.o. male.   Anxiety   63 year old male with history of bipolar disorder, hyperlipidemia, hypothyroidism, schizophrenia, ulcerative colitis, presenting to the ED for mental health evaluation.  Patient reports he and sister got into an argument last week which has "really been pissing me off" per patient.  He states it was a stupid argument but he can't stop thinking about it.  He reports he stopped taking his haldol about 1 month ago because "my doctor is a moron".  He does not feel like he should should be taking medications prescribed by this doctor.  He admits he has been eating/drinking.  He is not sleeping-- states he sleeps 1-2 hours at a time but has not slept through the night in about 2 weeks.  Denies SI/HI.  Home Medications Prior to Admission medications   Medication Sig Start Date End Date Taking? Authorizing Provider  acetaminophen (TYLENOL) 500 MG tablet Take 1,000 mg by mouth every 6 (six) hours as needed.    [provider]  aspirin 81 MG tablet Take 81 mg by mouth daily.    [provider]  diclofenac (VOLTAREN) 75 MG EC tablet Take 2 tablets by mouth daily. 10/11/19   [provider]  diphenhydrAMINE (BENADRYL) 50 MG capsule Take 300 mg by mouth at bedtime.    [provider]  fenofibrate (TRICOR) 145 MG tablet TAKE 1 TABLET EVERY DAY 01/05/23   Nahser, Deloris Ping, MD  haloperidol (HALDOL) 10 MG tablet Take 10 mg by mouth 2 (two) times daily. 11/05/21   [provider]  haloperidol (HALDOL) 5 MG tablet Take 5 mg by mouth at bedtime. 11/05/21   [provider]  levothyroxine (SYNTHROID) 150 MCG tablet Take 1 tablet by mouth  daily. 02/12/19   [provider]  rosuvastatin (CRESTOR) 20 MG tablet Take 1 tablet (20 mg total) by mouth daily. 03/03/23   Nahser, Deloris Ping, MD  sildenafil (REVATIO) 20 MG tablet TAKE ONE TO FIVE TABLETS BY MOUTH DAILY AS NEEDED 07/13/17   Shirline Frees, NP      Allergies    Darvocet [propoxyphene n-acetaminophen], Penicillins, and Wellbutrin [bupropion]    Review of Systems   Review of Systems  Psychiatric/Behavioral:  Positive for behavioral problems. The patient is nervous/anxious.   All other systems reviewed and are negative.   Physical Exam Updated Vital Signs BP 101/67   Pulse 72   Temp 98.2 F (36.8 C)   Resp 16   SpO2 99%  Physical Exam Vitals and nursing note reviewed.  Constitutional:      Appearance: He is well-developed.     Comments: Awake, alert Covered in orange powder substance (cheetos?)  HENT:     Head: Normocephalic and atraumatic.  Eyes:     Conjunctiva/sclera: Conjunctivae normal.     Pupils: Pupils are equal, round, and reactive to light.  Cardiovascular:     Rate and Rhythm: Normal rate and regular rhythm.     Heart sounds: Normal heart sounds.  Pulmonary:     Effort: Pulmonary effort is normal.     Breath sounds: Normal breath sounds.  Abdominal:     General: Bowel sounds are normal.  Palpations: Abdomen is soft.  Musculoskeletal:        General: Normal range of motion.     Cervical back: Normal range of motion.  Skin:    General: Skin is warm and dry.  Neurological:     Mental Status: He is alert and oriented to person, place, and time.  Psychiatric:     Comments: Observed to be talking to himself when not directly addressed Seems to be rambling, difficult to understand Denies SI/HI     ED Results / Procedures / Treatments   Labs (all labs ordered are listed, but only abnormal results are displayed) Labs Reviewed  COMPREHENSIVE METABOLIC PANEL - Abnormal; Notable for the following components:      Result Value    Glucose, Bld 110 (*)    Creatinine, Ser 1.31 (*)    AST 50 (*)    Alkaline Phosphatase 29 (*)    All other components within normal limits  ETHANOL  CBC WITH DIFFERENTIAL/PLATELET  RAPID URINE DRUG SCREEN, HOSP PERFORMED    EKG None  Radiology No results found.  Procedures Procedures  {Document cardiac monitor, telemetry assessment procedure when appropriate:1}  Medications Ordered in ED Medications - No data to display  ED Course/ Medical Decision Making/ A&P   {   Click here for ABCD2, HEART and other calculatorsREFRESH Note before signing :1}                              Medical Decision Making  ***  {Document critical care time when appropriate:1} {Document review of labs and clinical decision tools ie heart score, Chads2Vasc2 etc:1}  {Document your independent review of radiology images, and any outside records:1} {Document your discussion with family members, caretakers, and with consultants:1} {Document social determinants of health affecting pt's care:1} {Document your decision making why or why not admission, treatments were needed:1} Final Clinical Impression(s) / ED Diagnoses Final diagnoses:  None    Rx / DC Orders ED Discharge Orders     None

## 2023-09-01 NOTE — ED Notes (Signed)
 Pt pacing around the lobby and appearing to respond to internal stimuli. Pt appearing more anxious at this time.

## 2023-09-01 NOTE — ED Provider Triage Note (Signed)
 Emergency Medicine Provider Triage Evaluation Note  Andrew Jennings , a 63 y.o. male  was evaluated in triage.  Pt complains of paranoia and anxiety.  States he has been off his Haldol for 1 month.  States he got an argument with his sister last week that "set him off".  Did endorse hallucinations from last week.  States he thought he saw water dripping off the wall when he knew it was not actually there.  Denies SI or HI.  Here voluntarily.  Review of Systems  Positive: See above Negative: See above  Physical Exam  BP 93/64 (BP Location: Right Arm)   Pulse 93   Temp 97.8 F (36.6 C)   Resp 17   SpO2 93%  Gen:   Awake, no distress   Resp:  Normal effort  MSK:   Moves extremities without difficulty  Other:    Medical Decision Making  Medically screening exam initiated at 2:49 PM.  Appropriate orders placed.  Andrew Jennings was informed that the remainder of the evaluation will be completed by another provider, this initial triage assessment does not replace that evaluation, and the importance of remaining in the ED until their evaluation is complete.  Work up started   Andrew Eagle, PA-C 09/01/23 1450

## 2023-09-01 NOTE — ED Notes (Signed)
 Patient did not wanna stay still for the EKG tried 3 times

## 2023-09-01 NOTE — ED Triage Notes (Signed)
 PT states he is thinking too much and feeling paranoid. Has been off haldol for 1 month. Pt got in argument with sister last week and set him off. Pt is talking to self.   Denies si/hi

## 2023-09-02 ENCOUNTER — Encounter (HOSPITAL_COMMUNITY): Payer: Self-pay | Admitting: Psychiatry

## 2023-09-02 DIAGNOSIS — F209 Schizophrenia, unspecified: Secondary | ICD-10-CM | POA: Diagnosis not present

## 2023-09-02 MED ORDER — HALOPERIDOL 5 MG PO TABS: ORAL_TABLET | ORAL | 2 refills | Status: AC

## 2023-09-02 NOTE — ED Notes (Signed)
 Patient's belongings still with security after his departure. Family contacted and they will come to pick his valuables up.

## 2023-09-02 NOTE — Discharge Instructions (Signed)
 You were seen for a mental health evaluation in the emergency department.   At home, please continue taking your Haldol as prescribed.    Check your MyChart online for the results of any tests that had not resulted by the time you left the emergency department.   Follow-up with your psychiatrist in 2 to 3 days.  You may also go to behavioral health urgent care for additional resources.  Return immediately to the emergency department if you experience any of the following: Thoughts of harming yourself, thoughts of harming others, or hallucinations, or any other concerning symptoms.    Thank you for visiting our Emergency Department. It was a pleasure taking care of you today.

## 2023-09-02 NOTE — BH Assessment (Signed)
 TTS Consult will be completed by IRIS. IRIS Coordinator will notify in established secure chat assessment time and provider name.

## 2023-09-02 NOTE — ED Provider Notes (Signed)
 Emergency Medicine Observation Re-evaluation Note  Daveon Arpino is a 63 y.o. male history of bipolar and schizophrenia, seen on rounds today.  Pt initially presented to the ED for complaints of Paranoid and Anxiety Currently, the patient is stating that he feels cold.  Denies any SI, HI, or AVH.  Physical Exam  BP 98/69 (BP Location: Right Arm)   Pulse 67   Temp 97.9 F (36.6 C)   Resp 16   SpO2 99%  Physical Exam General: Sitting next stretcher Lungs: Normal work of breathing Psych: Calm and cooperative  ED Course / MDM  EKG:   I have reviewed the labs performed to date as well as medications administered while in observation.  Recent changes in the last 24 hours include cleared by psychiatry.  Plan  Current plan is for patient cleared for discharge by psychiatry.  They would like for him to resume his Haldol 5 mg once a day.  Can take 10 mg per day if psychotic.  Instructed to follow-up with his psychiatrist and [facility] if needed.   Rondel Baton, MD 09/02/23 [phone]

## 2023-09-02 NOTE — Consult Note (Signed)
 Andrew Jennings Telepsychiatry Consult Note  Patient Name: Andrew Jennings MRN: [id number] DOB: February 02, 1961 DATE OF Consult: 09/02/2023  PRIMARY PSYCHIATRIC DIAGNOSES  1.  Schizophrenia   RECOMMENDATIONS  Inpt psych admission recommended:    [] YES       [x]  NO   If yes:       []   Pt meets involuntary commitment criteria if not voluntary       [x]    Pt does not meet involuntary commitment criteria and must be         voluntary. If patient is not voluntary, then discharge is recommended.   Medication recommendations:  unclear at this eval why he is no longer taking his previously prescribed haldol that has helped him for  years;  Attempted to all sister Andrew Jennings on phone for collaborative information; obtained voicemail, no message left;   recommendations to re-iniaite  haldol 5 mg po once daily (1 tablet Orally Once a day with 10 mg tablet) for psychosis; haldol 10mg  po once daily for psychosis He declined any other medication trial; if concerns are toward TD, consider addition of Ingrezza or Austedo;  other consideration if he would agree would be a change of haldol to clozaril  He would like assistance in locating new mental health provider. Recommend SW consult to assist   I have discussed my assessment and treatment recommendations with the patient. Possible medication side effects/risks/benefits of current regimen.   Importance of medication adherence for medication to be beneficial.   Follow-Up Telepsychiatry C/L services:            []  We will continue to follow this patient with you.             [x]  Will sign off for now. Please re-consult our service as necessary.  Thank you for involving [place] in the care of this patient. If you have any additional questions or concerns, please call [phone] and ask for me or the provider on-call.  TELEPSYCHIATRY ATTESTATION & CONSENT  As the provider for this telehealth consult, I attest that I verified the patient's identity using two separate  identifiers, introduced myself to the patient, provided my credentials, disclosed my location, and performed this encounter via a HIPAA-compliant, real-time, face-to-face, two-way, interactive audio and video platform and with the full consent and agreement of the patient (or guardian as applicable.)  Patient physical location: [facility] ED. Telehealth provider physical location: home office in state of [place]  Video start time: 05:46 am  (Central Time) Video end time: 05:56 am  (Central Time)  IDENTIFYING DATA  Andrew Jennings is a 63 y.o. year-old male for whom a psychiatric consultation has been ordered by the primary provider. The patient was identified using two separate identifiers.  CHIEF COMPLAINT/REASON FOR CONSULT  "Under a lot of pressure taking care of my mother with my sister"  HISTORY OF PRESENT ILLNESS (HPI)  The patient with history of bipolar disorder, hyperlipidemia, hypothyroidism, schizophrenia, ulcerative colitis, presenting to the ED for mental health evaluation   Hx of treatment for  Schizophrenia Currently prescribed; haldol but stated his provider took him off it one month ago; he is unsure why, medical records do not indicate;  see he is having appts for AAA, physician documented not concerns for the haldol    Today, client denied symptoms of depression denied anergia, anhedonia, amotivation, some situational anxiety and frequent worry regarding his medical issues, feeling restlessness, no reported panic symptoms, no reported obsessive/compulsive behaviors. Client denies active SI/HI ideations, plans  or intent. There is questionable  evidence of psychosis as he has been noted to respond to internal stimuli by ED staff; not noted during this encounter; he is mildly irritable, demonstrates some paranoia;   Client denied past episodes of hypomania, hyperactivity, erratic/excessive spending, involvement in dangerous activities, self-inflated ego, grandiosity, or  promiscuity.  sleeping 4 hrs/24hrs, appetite "good"  concentration "good". No self-harm behaviors. Reviewed active outpatient medication list/reviewed labs. Obtained Collateral information from medical record.  PAST PSYCHIATRIC HISTORY    Previous Psychiatric Hospitalizations: last was 30 years Previous Detox/Residential treatments: denied Outpt treatment:  Dr. Donell Beers Previous psychotropic medication trials: bupropion Previous mental health diagnosis per client/MEDICAL RECORD NUMBERdepression, bipolar disorder and schizophrenia.   Suicide attempts/self-injurious behaviors:  denied history of suicidal/homicidal ideation/gestures; denied history of self-harm behaviors   PAST MEDICAL HISTORY  Past Medical History:  Diagnosis Date   AAA (abdominal aortic aneurysm) (HCC) last 18 yrs    4.1 cm per chest ct 03-25-17 epic, followed by dr Melburn Popper cardiology lov 04-30-16    Aneurysm, aorta, thoracic (HCC) 01/20/2014   Arthritis    Bipolar disorder (HCC) 12/19/2010   Bulging lumbar disc    Complication of anesthesia    "stopped breathing with old anesthesia  with colonscopy at dr perry's office 4 yrs ago, did fine with new anesthesia with colonscopy this year at dr perry's officeper pt"   Depression    GERD (gastroesophageal reflux disease)    Glaucoma    Hemorrhoids    History of adenomatous polyp of colon    History of exercise stress test 06-25-2017    dr Carney Harder   normal stress test without any evidence of ischemia, his chest pain atypical, his ascending aortis aneurism is small and has been stable by seial CT angiograms,  he is at low risk    History of head and neck radiation    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 ,  completed 09-04-2008   History of laryngeal cancer    05-27-2013 per last oncologist note by dr Bertis Ruddy ([facility])  2010 -- 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, treated w/ external radiation (completed 09-04-2008)---  cancer is cured, released from cancer center pt to follow up with ENT   Hyperlipidemia    Hypothyroidism    secondary radiation therpy induced in 02/ 2010   Laryngeal cancer (HCC) 12/19/2010   OSA (obstructive sleep apnea)    does not use macine cannot afford it   OSA on CPAP 12/25/2011   Schizophrenia (HCC)    Scoliosis    Ulcerative colitis 12/19/2010     HOME MEDICATIONS  Facility Ordered Medications  Medication   [COMPLETED] haloperidol (HALDOL) tablet 5 mg   PTA Medications  Medication Sig   diphenhydrAMINE (BENADRYL) 50 MG capsule Take 200 mg by mouth at bedtime as needed for sleep.   aspirin 81 MG tablet Take 81 mg by mouth daily.   acetaminophen (TYLENOL) 500 MG tablet Take 1,000 mg by mouth every 6 (six) hours as needed for mild pain (pain score 1-3).   levothyroxine (SYNTHROID) 150 MCG tablet Take 1 tablet by mouth daily.   haloperidol (HALDOL) 10 MG tablet Take 10 mg by mouth 2 (two) times daily.   haloperidol (HALDOL) 5 MG tablet Take 5 mg by mouth at bedtime.   rosuvastatin (CRESTOR) 20 MG tablet Take 1 tablet (20 mg total) by mouth daily.   sildenafil (REVATIO) 20 MG tablet TAKE ONE TO FIVE TABLETS BY MOUTH  DAILY AS NEEDED   fenofibrate (TRICOR) 145 MG tablet TAKE 1 TABLET EVERY DAY    ALLERGIES  Allergies  Allergen Reactions   Darvocet [Propoxyphene N-Acetaminophen] Other (See Comments)    "Makes me feel strange"..Per pt    Penicillins Other (See Comments)    Makes sick, per pt  Makes sick, per pt   Wellbutrin [Bupropion]     Makes me feel sick    SOCIAL & SUBSTANCE USE HISTORY  3 brothers, 3 sisters Living Situation: mother Single; no children                  obtains SSDI Education: "I was pushed out of high school to work". Denied current legal issues.   Social Drivers of Health Y/N   Physicist, medical Strain: N  Food Insecurity: N  Transportation Needs: N  Physical Activity: N  Stress: Y  Social Connections: N  Intimate Partner Violence: N   Housing Stability: N      Denied alcohol, drug use       FAMILY HISTORY   Family Psychiatric History (if known):  father hx schizophrenia, siblings with schizophrenia  MENTAL STATUS EXAM (MSE)  Mental Status Exam: General Appearance: Disheveled  Orientation:  Full (Time, Place, and Person)  Memory:  Immediate;   Good Recent;   Fair Remote;   Fair  Concentration:  Concentration: Fair  Recall:  Fair  Attention  Fair  Eye Contact:  Good  Speech:  Garbled  Language:  Good  Volume:  Normal  Mood: mildly irritable  Affect:  Constricted  Thought Process:  Goal Directed  Thought Content:  Paranoid Ideation  Suicidal Thoughts:  No  Homicidal Thoughts:  No  Judgement:  Impaired  Insight:  Lacking  Psychomotor Activity:  Restlessness  Akathisia:  No  Fund of Knowledge:  Fair    Assets:  Housing Social Support  Cognition:  WNL  ADL's:  Impaired  AIMS (if indicated):       VITALS  Blood pressure 98/69, pulse 67, temperature 97.9 F (36.6 C), resp. rate 16, SpO2 99%.  LABS  Admission on 09/01/2023  Component Date Value Ref Range Status   Sodium 09/01/2023 139  135 - 145 mmol/L Final   Potassium 09/01/2023 4.3  3.5 - 5.1 mmol/L Final   Chloride 09/01/2023 104  98 - 111 mmol/L Final   CO2 09/01/2023 29  22 - 32 mmol/L Final   Glucose, Bld 09/01/2023 110 (H)  70 - 99 mg/dL Final   Glucose reference range applies only to samples taken after fasting for at least 8 hours.   BUN 09/01/2023 20  8 - 23 mg/dL Final   Creatinine, Ser 09/01/2023 1.31 (H)  0.61 - 1.24 mg/dL Final   Calcium 44/07/270 9.6  8.9 - 10.3 mg/dL Final   Total Protein 53/66/4403 6.5  6.5 - 8.1 g/dL Final   Albumin 47/42/5956 3.9  3.5 - 5.0 g/dL Final   AST 38/75/6433 50 (H)  15 - 41 U/L Final   ALT 09/01/2023 38  0 - 44 U/L Final   Alkaline Phosphatase 09/01/2023 29 (L)  38 - 126 U/L Final   Total Bilirubin 09/01/2023 0.8  0.0 - 1.2 mg/dL Final   GFR, Estimated 09/01/2023 >60  >60 mL/min Final    Comment: (NOTE) Calculated using the CKD-EPI Creatinine Equation (2021)    Anion gap 09/01/2023 6  5 - 15 Final   Performed at [facility], [address], [place], [place] [address]  Alcohol, Ethyl (B) 09/01/2023 <10  <10 mg/dL Final   Comment: (NOTE) Lowest detectable limit for serum alcohol is 10 mg/dL.  For medical purposes only. Performed at [facility], [address], [place], [place] [address]    WBC 09/01/2023 6.7  4.0 - 10.5 K/uL Final   RBC 09/01/2023 5.11  4.22 - 5.81 MIL/uL Final   Hemoglobin 09/01/2023 15.2  13.0 - 17.0 g/dL Final   HCT 60/45/4098 44.6  39.0 - 52.0 % Final   MCV 09/01/2023 87.3  80.0 - 100.0 fL Final   MCH 09/01/2023 29.7  26.0 - 34.0 pg Final   MCHC 09/01/2023 34.1  30.0 - 36.0 g/dL Final   RDW 11/91/4782 12.7  11.5 - 15.5 % Final   Platelets 09/01/2023 297  150 - 400 K/uL Final   nRBC 09/01/2023 0.0  0.0 - 0.2 % Final   Neutrophils Relative % 09/01/2023 65  % Final   Neutro Abs 09/01/2023 4.3  1.7 - 7.7 K/uL Final   Lymphocytes Relative 09/01/2023 22  % Final   Lymphs Abs 09/01/2023 1.5  0.7 - 4.0 K/uL Final   Monocytes Relative 09/01/2023 12  % Final   Monocytes Absolute 09/01/2023 0.8  0.1 - 1.0 K/uL Final   Eosinophils Relative 09/01/2023 1  % Final   Eosinophils Absolute 09/01/2023 0.1  0.0 - 0.5 K/uL Final   Basophils Relative 09/01/2023 0  % Final   Basophils Absolute 09/01/2023 0.0  0.0 - 0.1 K/uL Final   Immature Granulocytes 09/01/2023 0  % Final   Abs Immature Granulocytes 09/01/2023 0.01  0.00 - 0.07 K/uL Final   Performed at [facility], [address], [place], [place] [address]    PSYCHIATRIC REVIEW OF SYSTEMS (ROS)  Depression:      [x]  Denies all symptoms of depression [] Depressed mood       [] Insomnia/hypersomnia              [] Fatigue        [] Change in appetite     [] Anhedonia                                [] Difficulty concentrating      [] Hopelessness             [] Worthlessness [] Guilt/shame                 [] Psychomotor agitation/retardation   Mania:     [] Denies all symptoms of mania [] Elevated mood           [x] Irritability         [] Pressured speech         []  Grandiosity         [x]  Decreased need for sleep                                                 [] Increased energy          []  Increase in goal directed activity                                       [] Flight of ideas    []  Excessive involvement in high-risk behaviors                   []   Distractibility     Psychosis:     [] Denies all symptoms of psychosis [x] Paranoia         []  Auditory Hallucinations          [] Visual hallucinations         [] ELOC        [] IOR                [] Delusions   Suicide:    [x]  Denies SI/plan/intent []  Passive SI         []   Active SI         [] Plan           [] Intent   Homicide:  [x]   Denies HI/plan/intent []  Passive HI         []  Active HI         [] Plan            [] Intent           [] Identified Target    Additional findings:      Musculoskeletal: No abnormal movements observed      Gait & Station: Laying/Sitting      Pain Screening: Denies      Nutrition & Dental Concerns: none reported  RISK FORMULATION/ASSESSMENT  Is the patient experiencing any suicidal or homicidal ideations: No       Explain if yes:  Protective factors considered for safety management:     Access to adequate health care Advice& help seeking Resourcefulness/Survival skills Sense of responsibility Positive social support: Positive therapeutic relationship Future oriented Suicide Inquiry:  Denies suicidal ideations, intentions, or plans.  Denies  recent self-harm behavior. Talks futuristically.  Risk factors/concerns considered for safety management:  Physical illness/chronic pain Access to lethal means Age over 50 Male gender Unmarried  Is there a safety management plan with the patient and treatment team to minimize risk factors and promote protective factors: Yes           Explain: safety  precautions Is crisis care placement or psychiatric hospitalization recommended: No     Based on my current evaluation and risk assessment, patient is determined at this time to be at:  Low risk  *RISK ASSESSMENT Risk assessment is a dynamic process; it is possible that this patient's condition, and risk level, may change. This should be re-evaluated and managed over time as appropriate. Please re-consult psychiatric consult services if additional assistance is needed in terms of risk assessment and management. If your team decides to discharge this patient, please advise the patient how to best access emergency psychiatric services, or to call 911, if their condition worsens or they feel unsafe in any way.  Total time spent in this encounter was 60 minutes with greater than 50% of time spent in counseling and coordination of care.     Dr. Olivia Mackie. Christell Constant, PhD, MSN, APRN, PMHNP-BC, [facility] Helper, NP Telepsychiatry Consult Services

## 2023-09-02 NOTE — ED Notes (Signed)
 Pt changed in to burgany scrubs. Pt belongings ( sweater and shots, and pair of shoes) was placed in [facility] locker 3. Security envelope

## 2023-09-07 DIAGNOSIS — F209 Schizophrenia, unspecified: Secondary | ICD-10-CM | POA: Diagnosis not present

## 2023-09-07 DIAGNOSIS — E039 Hypothyroidism, unspecified: Secondary | ICD-10-CM | POA: Diagnosis not present

## 2023-09-10 DIAGNOSIS — D649 Anemia, unspecified: Secondary | ICD-10-CM | POA: Diagnosis not present

## 2023-09-10 DIAGNOSIS — Z125 Encounter for screening for malignant neoplasm of prostate: Secondary | ICD-10-CM | POA: Diagnosis not present

## 2023-09-10 DIAGNOSIS — E559 Vitamin D deficiency, unspecified: Secondary | ICD-10-CM | POA: Diagnosis not present

## 2023-09-10 DIAGNOSIS — E034 Atrophy of thyroid (acquired): Secondary | ICD-10-CM | POA: Diagnosis not present

## 2023-09-10 DIAGNOSIS — E7800 Pure hypercholesterolemia, unspecified: Secondary | ICD-10-CM | POA: Diagnosis not present

## 2023-09-10 DIAGNOSIS — F209 Schizophrenia, unspecified: Secondary | ICD-10-CM | POA: Diagnosis not present

## 2023-09-11 DIAGNOSIS — Z0000 Encounter for general adult medical examination without abnormal findings: Secondary | ICD-10-CM | POA: Diagnosis not present

## 2023-09-18 DIAGNOSIS — E7800 Pure hypercholesterolemia, unspecified: Secondary | ICD-10-CM | POA: Diagnosis not present

## 2023-09-18 DIAGNOSIS — E034 Atrophy of thyroid (acquired): Secondary | ICD-10-CM | POA: Diagnosis not present

## 2023-09-22 DIAGNOSIS — G3184 Mild cognitive impairment, so stated: Secondary | ICD-10-CM | POA: Diagnosis not present

## 2023-09-30 DIAGNOSIS — E7800 Pure hypercholesterolemia, unspecified: Secondary | ICD-10-CM | POA: Diagnosis not present

## 2023-09-30 DIAGNOSIS — N390 Urinary tract infection, site not specified: Secondary | ICD-10-CM | POA: Diagnosis not present

## 2023-09-30 DIAGNOSIS — R1084 Generalized abdominal pain: Secondary | ICD-10-CM | POA: Diagnosis not present

## 2023-10-01 ENCOUNTER — Other Ambulatory Visit: Payer: Self-pay | Admitting: Cardiovascular Disease

## 2023-10-01 DIAGNOSIS — Z0100 Encounter for examination of eyes and vision without abnormal findings: Secondary | ICD-10-CM | POA: Diagnosis not present

## 2023-10-08 DIAGNOSIS — E7800 Pure hypercholesterolemia, unspecified: Secondary | ICD-10-CM | POA: Diagnosis not present

## 2023-10-08 DIAGNOSIS — E034 Atrophy of thyroid (acquired): Secondary | ICD-10-CM | POA: Diagnosis not present

## 2023-10-09 ENCOUNTER — Telehealth: Payer: Self-pay | Admitting: Cardiovascular Disease

## 2023-10-09 NOTE — Telephone Encounter (Signed)
 Patient called to report he has moved to [place].

## 2023-10-09 NOTE — Telephone Encounter (Signed)
 Pt calling again requesting cb from Maralyn Sago to discuss appt and him moving to [place]

## 2023-10-12 NOTE — Telephone Encounter (Signed)
 Returned call to patient who states he got the reminder letter for an appointment and he wanted [place] to know he's in [place] now with his brother. Advised he can follow-up with a cardiologist there and to call us if needed.

## 2023-10-19 ENCOUNTER — Encounter: Payer: Self-pay | Admitting: Cardiovascular Disease

## 2023-10-19 NOTE — Telephone Encounter (Signed)
 Error

## 2023-10-27 ENCOUNTER — Telehealth: Payer: Self-pay | Admitting: Cardiovascular Disease

## 2023-10-27 NOTE — Telephone Encounter (Signed)
 Left voice message to call back 4/15

## 2023-10-27 NOTE — Telephone Encounter (Signed)
 Pt calling in regards to the previous phone note. Please advise

## 2023-10-27 NOTE — Telephone Encounter (Signed)
 Patient would like to know if he can drink Coke Zero since is has no calories or sugar. He requests a call to his brother, Bambi Lever, at [phone]  if he's  unavailable when his call is returned.

## 2023-10-28 NOTE — Telephone Encounter (Signed)
 Spoke with pt regarding Coke Zero. Pt stated he had been told by Dr. Alroy Aspen and his nurse that he could not have soda due to his aneurisms. Pt was told his question would be forwarded to Dr. Alroy Aspen for clarification and his nurse for clarification. Pt stated "please hurry, I just bought soda." Pt verbalized understanding. All questions if any were answered.

## 2023-10-28 NOTE — Telephone Encounter (Signed)
 Pt returning call to a nurse. Please call [phone]

## 2023-10-29 ENCOUNTER — Telehealth: Payer: Self-pay | Admitting: Cardiovascular Disease

## 2023-10-29 NOTE — Telephone Encounter (Signed)
 Follow Up:      Patient said he was returning a call. He said he thought it was from [place].

## 2023-10-29 NOTE — Telephone Encounter (Signed)
 Pt is now curious if he can have seltzer water since it doesn't have caffeine?

## 2023-10-30 NOTE — Telephone Encounter (Signed)
 Pt notified that he make drink seltzer water . Pt verbalized understanding. All questions if any were answered.

## 2023-10-30 NOTE — Telephone Encounter (Signed)
 Pt notified that he may have an occassional coke zero. Pt verbalized understanding. All questions if any were answered.

## 2023-12-01 ENCOUNTER — Other Ambulatory Visit: Payer: Self-pay | Admitting: Cardiovascular Disease

## 2023-12-02 ENCOUNTER — Telehealth: Payer: Self-pay | Admitting: Cardiovascular Disease

## 2023-12-02 NOTE — Telephone Encounter (Signed)
 Will forward to Dr Alroy Aspen for his review upon his return. Aaron Aas

## 2023-12-02 NOTE — Telephone Encounter (Signed)
 Patient says he moved to [place]  and would like to know if Dr. Alroy Aspen can recommend a cardiologist in that area. Please advise.

## 2023-12-04 NOTE — Telephone Encounter (Signed)
 Andrew Haggard, MD  Thaddeus Filippo, RNYesterday (9:34 AM)   Need more specific   What city in [place]    Called and asked patient about cardiologist referral. He is near/living in [facility], [place] .

## 2023-12-08 NOTE — Telephone Encounter (Signed)
 Called and spoke with patient who states he wants to go to [facility] instead. Advised he doesn't need our referral, but if they request any information, to let us  know and we'll get it to them.

## 2023-12-08 NOTE — Telephone Encounter (Signed)
 Patient is returning call. Please advise?

## 2023-12-08 NOTE — Telephone Encounter (Signed)
 There is a branch of the group [facility] (hub is [place])in [place] Reviewing their website I woulld recommend looking into an appt with Dr Jesslyn Moro or Dr Marylynn Soho

## 2023-12-08 NOTE — Telephone Encounter (Signed)
 Called and made pt aware of MD recommendations. Provided following info based on google search. He will call us  if they need records sent over.  Address: [address] [place] [address] Phone: [phone] Fax [phone]

## 2023-12-17 NOTE — Telephone Encounter (Signed)
 Pt requesting a c/b from Sarah. Please advise

## 2023-12-17 NOTE — Telephone Encounter (Signed)
 I called and spoke with the pt and he asked to have Abraham Hoffmann call him when she is back in the office... he says it is about an upcoming appt.

## 2023-12-21 NOTE — Telephone Encounter (Signed)
 Called and spoke with patient who wanted to confirm when he would need another CTA on his aortic aneurysm. Informed him that he just had this done in January (5 months ago) and that it was stable, so would not be due for another scan per radiologist, until next year (annual imaging). Pt appreciative of call back.

## 2023-12-23 ENCOUNTER — Other Ambulatory Visit: Payer: Self-pay | Admitting: Cardiovascular Disease

## 2023-12-25 ENCOUNTER — Other Ambulatory Visit: Payer: Self-pay | Admitting: Thoracic Surgery (Cardiothoracic Vascular Surgery)

## 2023-12-25 DIAGNOSIS — I7121 Aneurysm of the ascending aorta, without rupture: Secondary | ICD-10-CM

## 2023-12-28 ENCOUNTER — Telehealth: Payer: Self-pay | Admitting: Cardiovascular Disease

## 2023-12-28 ENCOUNTER — Telehealth (HOSPITAL_COMMUNITY): Payer: Self-pay | Admitting: Thoracic Surgery (Cardiothoracic Vascular Surgery)

## 2023-12-28 NOTE — Telephone Encounter (Signed)
 Pt wants nurse Isa Manuel to call him and let him know when his next Echo is due

## 2023-12-28 NOTE — Telephone Encounter (Signed)
 I called to schedule the ordered echocardiogram and patient declined to scheduled and states that he moved to [place]  and will need to have scheduled up there. I informed patient that I would let you know and he would like call from th office please.  Order will be removed from the echo WQ, Thank you.

## 2023-12-28 NOTE — Telephone Encounter (Signed)
 Patient wanted to know when was his last echo.  Last echo was in 2013. Informed patient order was placed by Dr. Luna Salinas for recent echo

## 2024-01-12 ENCOUNTER — Telehealth: Payer: Self-pay | Admitting: Cardiovascular Disease

## 2024-01-12 NOTE — Telephone Encounter (Signed)
 Returned call to patient who states he hasn't yet established with cardiologist in [place] . He anticipates going to [facility]. He is coming back to [place] on 01/18/24-01/22/24. He would like to see provider while here. I've scheduled with Dr Pietro who's DOD since Dr Alveta retired and he has not been seen by another physician yet. Scheduled for ECHO that Dr Kerrin wanted done to be done on the same date, but at 12:50. Pt verbalized understanding. Routing to [employer] and [employer] ([employer] to link ECHO order and get prior auth if needed.

## 2024-01-12 NOTE — Telephone Encounter (Signed)
 Pt requesting a c/b from [place] only he doesn't want to provide anymore information. Please advise

## 2024-01-13 ENCOUNTER — Other Ambulatory Visit: Payer: Self-pay | Admitting: Thoracic Surgery (Cardiothoracic Vascular Surgery)

## 2024-01-13 DIAGNOSIS — I7121 Aneurysm of the ascending aorta, without rupture: Secondary | ICD-10-CM

## 2024-01-18 NOTE — Progress Notes (Deleted)
 HPI: Follow-up thoracic aortic aneurysm.  Previously followed by Dr. Alveta but transitioning to me.  Echocardiogram in 2013 showed normal LV function with mild mitral and aortic insufficiency and mildly dilated aortic root.  Exercise treadmill 2018 normal.  CTA January 2025 showed mildly dilated aortic root at 4.5 cm and stable dilatation of the descending thoracic aorta measuring 4.2 cm.  Since last seen  Current Outpatient Medications  Medication Sig Dispense Refill   acetaminophen (TYLENOL) 500 MG tablet Take 1,000 mg by mouth every 6 (six) hours as needed for mild pain (pain score 1-3).     aspirin 81 MG tablet Take 81 mg by mouth daily.     diphenhydrAMINE (BENADRYL) 50 MG capsule Take 200 mg by mouth at bedtime as needed for sleep.     fenofibrate  (TRICOR ) 145 MG tablet Take 1 tablet (145 mg total) by mouth daily. 90 tablet 3   haloperidol  (HALDOL ) 5 MG tablet Take 1 tablet (5 mg total) by mouth daily. May also take 1 tablet (5 mg total) daily as needed (psychosis, delusions, hallucinations). 30 tablet 2   levothyroxine  (SYNTHROID ) 137 MCG tablet Take 137 mcg by mouth daily.     rosuvastatin  (CRESTOR ) 20 MG tablet TAKE 1 TABLET EVERY DAY (DOSE INCREASE) 90 tablet 3   No current facility-administered medications for this visit.     Past Medical History:  Diagnosis Date   AAA (abdominal aortic aneurysm) (HCC) last 18 yrs    4.1 cm per chest ct 03-25-17 epic, followed by dr calhoun cardiology lov 04-30-16    Aneurysm, aorta, thoracic (HCC) 01/20/2014   Arthritis    Bipolar disorder (HCC) 12/19/2010   Bulging lumbar disc    Complication of anesthesia    stopped breathing with old anesthesia  with colonscopy at dr perry's office 4 yrs ago, did fine with new anesthesia with colonscopy this year at dr perry's officeper pt   Depression    GERD (gastroesophageal reflux disease)    Glaucoma    Hemorrhoids    History of adenomatous polyp of colon    History of exercise stress test  06-25-2017    dr maxwell   normal stress test without any evidence of ischemia, his chest pain atypical, his ascending aortis aneurism is small and has been stable by seial CT angiograms,  he is at low risk    History of head and neck radiation    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 ,  completed 09-04-2008   History of laryngeal cancer    05-27-2013 per last oncologist note by dr lonn ([facility])  2010 -- 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, treated w/ external radiation (completed 09-04-2008)--- cancer is cured, released from cancer center pt to follow up with ENT   Hyperlipidemia    Hypothyroidism    secondary radiation therpy induced in 02/ 2010   Laryngeal cancer (HCC) 12/19/2010   OSA (obstructive sleep apnea)    does not use macine cannot afford it   OSA on CPAP 12/25/2011   Schizophrenia (HCC)    Scoliosis    Ulcerative colitis 12/19/2010    Past Surgical History:  Procedure Laterality Date   CARDIOVASCULAR STRESS TEST  03/03/2013   normal nuclear study (lexiscan  no exercise) w/ no evidence ishcemia/  normal LV function and wall motion, 41%   COLONOSCOPY  last one 01-22-2017  dr abran   CYSTOSCOPY/RETROGRADE/URETEROSCOPY Right 07/23/2017   Procedure: CYSTOSCOPY RIGHT RETROGRADE;  Surgeon:  Watt Rush, MD;  Location: [facility];  Service: Urology;  Laterality: Right;   NECK SURGERY  yrs ago   BLOOD CLOT IN NECK   TRANSTHORACIC ECHOCARDIOGRAM  12/18/2011   mild concentric LVH, ef >55%/ mild AR and MR/ trace TR/ mild PR/ mild aortic root dilatation   UMBILICAL HERNIA REPAIR  as child    Social History   Socioeconomic History   Marital status: Single    Spouse name: Not on file   Number of children: Not on file   Years of education: Not on file   Highest education level: Not on file  Occupational History   Not on file  Tobacco Use   Smoking status: Former    Current packs/day: 0.00    Types:  Cigarettes    Quit date: 05/02/2009    Years since quitting: 14.7   Smokeless tobacco: Never  Vaping Use   Vaping status: Never Used  Substance and Sexual Activity   Alcohol use: Yes    Comment: occasional   Drug use: No   Sexual activity: Not on file  Other Topics Concern   Not on file  Social History Narrative   Not on file   Social Drivers of Health   Financial Resource Strain: Low Risk  (05/01/2023)   Overall Financial Resource Strain (CARDIA)    Difficulty of Paying Living Expenses: Not very hard  Food Insecurity: No Food Insecurity (05/01/2023)   Hunger Vital Sign    Worried About Running Out of Food in the Last Year: Never true    Ran Out of Food in the Last Year: Never true  Transportation Needs: Unmet Transportation Needs (05/01/2023)   PRAPARE - Administrator, Civil Service (Medical): Yes    Lack of Transportation (Non-Medical): Yes  Physical Activity: Not on file  Stress: Not on file  Social Connections: Not on file  Intimate Partner Violence: Not on file    Family History  Problem Relation Age of Onset   Arthritis Mother    Hyperlipidemia Mother    Heart disease Mother    Arthritis Father    Colon cancer Neg Hx     ROS: no fevers or chills, productive cough, hemoptysis, dysphasia, odynophagia, melena, hematochezia, dysuria, hematuria, rash, seizure activity, orthopnea, PND, pedal edema, claudication. Remaining systems are negative.  Physical Exam: Well-developed well-nourished in no acute distress.  Skin is warm and dry.  HEENT is normal.  Neck is supple.  Chest is clear to auscultation with normal expansion.  Cardiovascular exam is regular rate and rhythm.  Abdominal exam nontender or distended. No masses palpated. Extremities show no edema. neuro grossly intact  ECG- personally reviewed  A/P  1 thoracic aortic aneurysm-plan follow-up CTA January 2026.  2 hyperlipidemia-continue present medications.  Redell Shallow, MD

## 2024-01-19 ENCOUNTER — Ambulatory Visit (HOSPITAL_COMMUNITY)
Admission: RE | Admit: 2024-01-19 | Discharge: 2024-01-19 | Disposition: A | Discharge status: Home / Self Care | Source: Ambulatory Visit | Attending: Cardiology | Admitting: Cardiology

## 2024-01-19 ENCOUNTER — Ambulatory Visit (HOSPITAL_COMMUNITY): Admitting: Cardiology

## 2024-01-19 DIAGNOSIS — I7121 Aneurysm of the ascending aorta, without rupture: Secondary | ICD-10-CM | POA: Insufficient documentation

## 2024-01-19 LAB — ECHOCARDIOGRAM COMPLETE
Area-P 1/2: 2.69 cm2
P 1/2 time: 679 ms
S' Lateral: 3.5 cm

## 2024-01-20 ENCOUNTER — Telehealth: Payer: Self-pay | Admitting: Cardiovascular Disease

## 2024-01-20 NOTE — Telephone Encounter (Signed)
 Patient says yesterday he dropped off a card for Lauraine and he would like to confirm it has been given to her. Please advise.

## 2024-01-25 NOTE — Telephone Encounter (Signed)
Attempted phone call to pt and left voicemail message to contact triage at [phone]. 

## 2024-01-25 NOTE — Telephone Encounter (Addendum)
 Pt aware Camie is not back in the office until next Wednesday.  Aware that another nurse is holding onto the card he is asking about, and will give it to Camie when she returns.  He would like Camie to call him when she returns and confirm she got the card.   He would like her to call him at [phone]

## 2024-01-25 NOTE — Telephone Encounter (Signed)
 Patient wants a call back regarding his results and to check if RN Lauraine received his card.

## 2024-01-25 NOTE — Telephone Encounter (Signed)
 Patient returned RN's call and stated can reach him at [phone].

## 2024-02-01 ENCOUNTER — Telehealth: Payer: Self-pay | Admitting: Cardiovascular Disease

## 2024-02-01 NOTE — Telephone Encounter (Signed)
  Patient called answering service stating that he needs his ultrasound results

## 2024-02-01 NOTE — Telephone Encounter (Signed)
 Called pt, advised to call Dr Hendrickson's office for result being that order is under his name.  [facility] office number given to pt per his request.

## 2024-02-03 ENCOUNTER — Telehealth: Payer: Self-pay | Admitting: Cardiovascular Disease

## 2024-02-03 NOTE — Telephone Encounter (Signed)
 New Message:       Patient said he need for Sarah to please call him. He said he waiting to hear something from her.

## 2024-02-03 NOTE — Telephone Encounter (Signed)
 Completed in separate encounter on 02/04/24.

## 2024-02-03 NOTE — Telephone Encounter (Signed)
 Spoke with pt, he wants to lmow if sarah got his card. Aware will have her return his call tomorrow.

## 2024-02-09 ENCOUNTER — Ambulatory Visit

## 2024-03-02 NOTE — Telephone Encounter (Signed)
 Spoke with pt, aware lauraine is not here anymore and as far as I know, she received his card.

## 2024-03-02 NOTE — Telephone Encounter (Signed)
 Patient is calling in about his card. Please advise

## 2024-04-05 ENCOUNTER — Telehealth: Payer: Self-pay | Admitting: Cardiovascular Disease

## 2024-04-05 NOTE — Telephone Encounter (Signed)
 Called pt in regards to referral.  Advised pt Dr. Alveta has retired.  Pt reports has a PCP in [place] .  Advised pt to have PCP refer to Cardiology since they are in the area and are familiar with Cardiologist in the area.  Advised if PCP doesn't want to place referral to call our office back with the name of a provider and we will place referral.  Pt expresses understanding.

## 2024-04-05 NOTE — Telephone Encounter (Signed)
 Previous [facility] patient has moved to [place]  and is calling for some care advice. He would like to know if he could have a referral to a Cardiologist in [place] . He is also hoping someone could reach out to him with information of what kind of diet would be most helpful to his health. He can be reached at [phone] or [phone]. Please advise.
